# Patient Record
Sex: Female | Born: 1940 | Race: White | Hispanic: No | Marital: Married | Smoking: Never smoker
Health system: Southern US, Community
[De-identification: ages and names within clinical notes are randomized; demographics above are authoritative.]

## PROBLEM LIST (undated history)

## (undated) DIAGNOSIS — M1990 Unspecified osteoarthritis, unspecified site: Secondary | ICD-10-CM

## (undated) DIAGNOSIS — F32A Depression, unspecified: Secondary | ICD-10-CM

## (undated) DIAGNOSIS — K760 Fatty (change of) liver, not elsewhere classified: Secondary | ICD-10-CM

## (undated) DIAGNOSIS — D509 Iron deficiency anemia, unspecified: Secondary | ICD-10-CM

## (undated) DIAGNOSIS — E785 Hyperlipidemia, unspecified: Secondary | ICD-10-CM

## (undated) DIAGNOSIS — F419 Anxiety disorder, unspecified: Secondary | ICD-10-CM

## (undated) DIAGNOSIS — M359 Systemic involvement of connective tissue, unspecified: Secondary | ICD-10-CM

## (undated) DIAGNOSIS — F329 Major depressive disorder, single episode, unspecified: Secondary | ICD-10-CM

## (undated) DIAGNOSIS — I10 Essential (primary) hypertension: Secondary | ICD-10-CM

## (undated) DIAGNOSIS — E119 Type 2 diabetes mellitus without complications: Secondary | ICD-10-CM

## (undated) DIAGNOSIS — C55 Malignant neoplasm of uterus, part unspecified: Secondary | ICD-10-CM

## (undated) DIAGNOSIS — K8020 Calculus of gallbladder without cholecystitis without obstruction: Secondary | ICD-10-CM

## (undated) DIAGNOSIS — D249 Benign neoplasm of unspecified breast: Secondary | ICD-10-CM

## (undated) DIAGNOSIS — G8929 Other chronic pain: Secondary | ICD-10-CM

## (undated) HISTORY — DX: Malignant neoplasm of uterus, part unspecified: C55

## (undated) HISTORY — DX: Anxiety disorder, unspecified: F41.9

## (undated) HISTORY — PX: CHOLECYSTECTOMY: SHX55

## (undated) HISTORY — DX: Iron deficiency anemia, unspecified: D50.9

## (undated) HISTORY — DX: Type 2 diabetes mellitus without complications: E11.9

## (undated) HISTORY — DX: Other chronic pain: G89.29

## (undated) HISTORY — DX: Unspecified osteoarthritis, unspecified site: M19.90

## (undated) HISTORY — DX: Major depressive disorder, single episode, unspecified: F32.9

## (undated) HISTORY — DX: Calculus of gallbladder without cholecystitis without obstruction: K80.20

## (undated) HISTORY — PX: TONSILLECTOMY: SUR1361

## (undated) HISTORY — DX: Benign neoplasm of unspecified breast: D24.9

## (undated) HISTORY — DX: Fatty (change of) liver, not elsewhere classified: K76.0

## (undated) HISTORY — DX: Depression, unspecified: F32.A

## (undated) HISTORY — DX: Essential (primary) hypertension: I10

## (undated) HISTORY — PX: TUBAL LIGATION: SHX77

## (undated) HISTORY — DX: Hyperlipidemia, unspecified: E78.5

---

## 1992-11-10 HISTORY — PX: CARPAL TUNNEL RELEASE: SHX101

## 2001-07-15 ENCOUNTER — Encounter: Payer: Self-pay | Admitting: Internal Medicine

## 2001-07-15 ENCOUNTER — Ambulatory Visit (HOSPITAL_COMMUNITY): Admission: RE | Admit: 2001-07-15 | Discharge: 2001-07-15 | Payer: Self-pay | Admitting: Internal Medicine

## 2001-07-16 ENCOUNTER — Ambulatory Visit (HOSPITAL_COMMUNITY): Admission: RE | Admit: 2001-07-16 | Discharge: 2001-07-16 | Payer: Self-pay | Admitting: Internal Medicine

## 2001-08-03 ENCOUNTER — Encounter (HOSPITAL_COMMUNITY): Admission: RE | Admit: 2001-08-03 | Discharge: 2001-09-02 | Payer: Self-pay | Admitting: Oncology

## 2001-08-03 ENCOUNTER — Encounter: Admission: RE | Admit: 2001-08-03 | Discharge: 2001-08-03 | Payer: Self-pay | Admitting: Oncology

## 2001-09-09 ENCOUNTER — Encounter: Admission: RE | Admit: 2001-09-09 | Discharge: 2001-09-09 | Payer: Self-pay | Admitting: Oncology

## 2001-09-09 ENCOUNTER — Encounter (HOSPITAL_COMMUNITY): Admission: RE | Admit: 2001-09-09 | Discharge: 2001-10-09 | Payer: Self-pay | Admitting: Oncology

## 2001-11-10 HISTORY — PX: COLONOSCOPY: SHX174

## 2001-11-15 ENCOUNTER — Encounter (HOSPITAL_COMMUNITY): Admission: RE | Admit: 2001-11-15 | Discharge: 2001-12-15 | Payer: Self-pay | Admitting: Oncology

## 2001-11-15 ENCOUNTER — Encounter: Admission: RE | Admit: 2001-11-15 | Discharge: 2001-11-15 | Payer: Self-pay | Admitting: Oncology

## 2001-12-03 ENCOUNTER — Ambulatory Visit (HOSPITAL_COMMUNITY): Admission: RE | Admit: 2001-12-03 | Discharge: 2001-12-03 | Payer: Self-pay | Admitting: Internal Medicine

## 2002-04-14 ENCOUNTER — Encounter (HOSPITAL_COMMUNITY): Admission: RE | Admit: 2002-04-14 | Discharge: 2002-05-14 | Payer: Self-pay | Admitting: Oncology

## 2002-04-14 ENCOUNTER — Encounter: Admission: RE | Admit: 2002-04-14 | Discharge: 2002-04-14 | Payer: Self-pay | Admitting: Oncology

## 2002-10-21 ENCOUNTER — Encounter (HOSPITAL_COMMUNITY): Admission: RE | Admit: 2002-10-21 | Discharge: 2002-11-20 | Payer: Self-pay | Admitting: Oncology

## 2002-10-21 ENCOUNTER — Encounter: Admission: RE | Admit: 2002-10-21 | Discharge: 2002-10-21 | Payer: Self-pay | Admitting: Oncology

## 2003-04-10 ENCOUNTER — Ambulatory Visit (HOSPITAL_COMMUNITY): Admission: RE | Admit: 2003-04-10 | Discharge: 2003-04-10 | Payer: Self-pay | Admitting: Internal Medicine

## 2003-04-10 ENCOUNTER — Encounter: Payer: Self-pay | Admitting: Internal Medicine

## 2003-04-17 ENCOUNTER — Ambulatory Visit (HOSPITAL_COMMUNITY): Admission: RE | Admit: 2003-04-17 | Discharge: 2003-04-17 | Payer: Self-pay | Admitting: Internal Medicine

## 2003-04-17 ENCOUNTER — Encounter: Payer: Self-pay | Admitting: Internal Medicine

## 2003-04-21 ENCOUNTER — Encounter: Admission: RE | Admit: 2003-04-21 | Discharge: 2003-04-21 | Payer: Self-pay | Admitting: Oncology

## 2003-04-21 ENCOUNTER — Encounter (HOSPITAL_COMMUNITY): Admission: RE | Admit: 2003-04-21 | Discharge: 2003-05-21 | Payer: Self-pay | Admitting: Oncology

## 2003-07-18 ENCOUNTER — Encounter (HOSPITAL_COMMUNITY): Admission: RE | Admit: 2003-07-18 | Discharge: 2003-08-10 | Payer: Self-pay | Admitting: Oncology

## 2003-07-18 ENCOUNTER — Encounter: Admission: RE | Admit: 2003-07-18 | Discharge: 2003-07-18 | Payer: Self-pay | Admitting: Oncology

## 2003-09-21 ENCOUNTER — Ambulatory Visit (HOSPITAL_BASED_OUTPATIENT_CLINIC_OR_DEPARTMENT_OTHER): Admission: RE | Admit: 2003-09-21 | Discharge: 2003-09-21 | Payer: Self-pay | Admitting: Orthopedic Surgery

## 2003-09-21 ENCOUNTER — Ambulatory Visit (HOSPITAL_COMMUNITY): Admission: RE | Admit: 2003-09-21 | Discharge: 2003-09-21 | Payer: Self-pay | Admitting: Orthopedic Surgery

## 2003-10-12 ENCOUNTER — Ambulatory Visit (HOSPITAL_COMMUNITY): Admission: RE | Admit: 2003-10-12 | Discharge: 2003-10-12 | Payer: Self-pay | Admitting: Internal Medicine

## 2003-10-24 ENCOUNTER — Encounter: Admission: RE | Admit: 2003-10-24 | Discharge: 2003-10-24 | Payer: Self-pay | Admitting: Oncology

## 2003-10-24 ENCOUNTER — Encounter (HOSPITAL_COMMUNITY): Admission: RE | Admit: 2003-10-24 | Discharge: 2003-11-23 | Payer: Self-pay | Admitting: Oncology

## 2003-10-25 ENCOUNTER — Ambulatory Visit (HOSPITAL_COMMUNITY): Admission: RE | Admit: 2003-10-25 | Discharge: 2003-10-25 | Payer: Self-pay | Admitting: Internal Medicine

## 2004-01-19 ENCOUNTER — Encounter (HOSPITAL_COMMUNITY): Admission: RE | Admit: 2004-01-19 | Discharge: 2004-02-18 | Payer: Self-pay | Admitting: Oncology

## 2004-01-19 ENCOUNTER — Encounter: Admission: RE | Admit: 2004-01-19 | Discharge: 2004-01-19 | Payer: Self-pay | Admitting: Oncology

## 2004-03-06 ENCOUNTER — Ambulatory Visit (HOSPITAL_COMMUNITY): Admission: RE | Admit: 2004-03-06 | Discharge: 2004-03-06 | Payer: Self-pay | Admitting: Internal Medicine

## 2004-04-24 ENCOUNTER — Encounter (HOSPITAL_COMMUNITY): Admission: RE | Admit: 2004-04-24 | Discharge: 2004-05-24 | Payer: Self-pay | Admitting: Oncology

## 2004-04-24 ENCOUNTER — Encounter: Admission: RE | Admit: 2004-04-24 | Discharge: 2004-04-24 | Payer: Self-pay | Admitting: Oncology

## 2004-05-29 ENCOUNTER — Ambulatory Visit (HOSPITAL_COMMUNITY): Admission: RE | Admit: 2004-05-29 | Discharge: 2004-05-29 | Payer: Self-pay | Admitting: Internal Medicine

## 2004-06-03 ENCOUNTER — Encounter: Admission: RE | Admit: 2004-06-03 | Discharge: 2004-06-03 | Payer: Self-pay | Admitting: Oncology

## 2004-08-23 ENCOUNTER — Encounter: Admission: RE | Admit: 2004-08-23 | Discharge: 2004-08-23 | Payer: Self-pay | Admitting: Oncology

## 2004-08-23 ENCOUNTER — Encounter (HOSPITAL_COMMUNITY): Admission: RE | Admit: 2004-08-23 | Discharge: 2004-09-22 | Payer: Self-pay | Admitting: Oncology

## 2004-11-10 DIAGNOSIS — K760 Fatty (change of) liver, not elsewhere classified: Secondary | ICD-10-CM

## 2004-11-10 DIAGNOSIS — K8020 Calculus of gallbladder without cholecystitis without obstruction: Secondary | ICD-10-CM

## 2004-11-10 HISTORY — DX: Fatty (change of) liver, not elsewhere classified: K76.0

## 2004-11-10 HISTORY — DX: Calculus of gallbladder without cholecystitis without obstruction: K80.20

## 2004-11-10 HISTORY — PX: CHOLECYSTECTOMY, LAPAROSCOPIC: SHX56

## 2004-12-25 ENCOUNTER — Encounter: Admission: RE | Admit: 2004-12-25 | Discharge: 2004-12-25 | Payer: Self-pay | Admitting: Oncology

## 2004-12-25 ENCOUNTER — Ambulatory Visit (HOSPITAL_COMMUNITY): Payer: Self-pay | Admitting: Oncology

## 2004-12-25 ENCOUNTER — Encounter (HOSPITAL_COMMUNITY): Admission: RE | Admit: 2004-12-25 | Discharge: 2005-01-24 | Payer: Self-pay | Admitting: Oncology

## 2005-02-26 ENCOUNTER — Encounter (HOSPITAL_COMMUNITY): Admission: RE | Admit: 2005-02-26 | Discharge: 2005-03-28 | Payer: Self-pay | Admitting: Oncology

## 2005-02-26 ENCOUNTER — Ambulatory Visit (HOSPITAL_COMMUNITY): Payer: Self-pay | Admitting: Oncology

## 2005-02-26 ENCOUNTER — Encounter: Admission: RE | Admit: 2005-02-26 | Discharge: 2005-02-26 | Payer: Self-pay | Admitting: Oncology

## 2005-03-27 ENCOUNTER — Encounter (HOSPITAL_COMMUNITY): Admission: RE | Admit: 2005-03-27 | Discharge: 2005-04-26 | Payer: Self-pay | Admitting: Rheumatology

## 2005-05-22 ENCOUNTER — Ambulatory Visit (HOSPITAL_COMMUNITY): Admission: RE | Admit: 2005-05-22 | Discharge: 2005-05-22 | Payer: Self-pay | Admitting: Internal Medicine

## 2005-06-04 ENCOUNTER — Encounter (INDEPENDENT_AMBULATORY_CARE_PROVIDER_SITE_OTHER): Payer: Self-pay | Admitting: General Surgery

## 2005-06-05 ENCOUNTER — Inpatient Hospital Stay (HOSPITAL_COMMUNITY): Admission: RE | Admit: 2005-06-05 | Discharge: 2005-06-06 | Payer: Self-pay | Admitting: General Surgery

## 2005-07-15 ENCOUNTER — Ambulatory Visit (HOSPITAL_COMMUNITY): Payer: Self-pay | Admitting: Oncology

## 2005-07-15 ENCOUNTER — Encounter (HOSPITAL_COMMUNITY): Admission: RE | Admit: 2005-07-15 | Discharge: 2005-08-08 | Payer: Self-pay

## 2005-07-15 ENCOUNTER — Encounter: Admission: RE | Admit: 2005-07-15 | Discharge: 2005-08-08 | Payer: Self-pay | Admitting: Oncology

## 2005-07-29 ENCOUNTER — Ambulatory Visit (HOSPITAL_COMMUNITY): Admission: RE | Admit: 2005-07-29 | Discharge: 2005-07-29 | Payer: Self-pay | Admitting: Internal Medicine

## 2006-01-29 ENCOUNTER — Encounter: Admission: RE | Admit: 2006-01-29 | Discharge: 2006-01-29 | Payer: Self-pay | Admitting: Oncology

## 2006-01-29 ENCOUNTER — Ambulatory Visit (HOSPITAL_COMMUNITY): Payer: Self-pay | Admitting: Oncology

## 2006-01-29 ENCOUNTER — Encounter (HOSPITAL_COMMUNITY): Admission: RE | Admit: 2006-01-29 | Discharge: 2006-02-28 | Payer: Self-pay | Admitting: Oncology

## 2006-05-24 ENCOUNTER — Emergency Department (HOSPITAL_COMMUNITY): Admission: EM | Admit: 2006-05-24 | Discharge: 2006-05-24 | Payer: Self-pay | Admitting: Emergency Medicine

## 2006-06-09 ENCOUNTER — Encounter (HOSPITAL_COMMUNITY): Admission: RE | Admit: 2006-06-09 | Discharge: 2006-07-09 | Payer: Self-pay | Admitting: Internal Medicine

## 2006-08-06 ENCOUNTER — Encounter (HOSPITAL_COMMUNITY): Admission: RE | Admit: 2006-08-06 | Discharge: 2006-08-08 | Payer: Self-pay | Admitting: Oncology

## 2006-08-06 ENCOUNTER — Ambulatory Visit (HOSPITAL_COMMUNITY): Payer: Self-pay | Admitting: Oncology

## 2006-08-06 ENCOUNTER — Encounter: Admission: RE | Admit: 2006-08-06 | Discharge: 2006-08-08 | Payer: Self-pay | Admitting: Oncology

## 2006-08-11 ENCOUNTER — Ambulatory Visit (HOSPITAL_COMMUNITY): Admission: RE | Admit: 2006-08-11 | Discharge: 2006-08-11 | Payer: Self-pay | Admitting: Internal Medicine

## 2007-02-03 ENCOUNTER — Encounter (HOSPITAL_COMMUNITY): Admission: RE | Admit: 2007-02-03 | Discharge: 2007-03-05 | Payer: Self-pay | Admitting: Oncology

## 2007-02-03 ENCOUNTER — Ambulatory Visit (HOSPITAL_COMMUNITY): Payer: Self-pay | Admitting: Oncology

## 2007-07-27 ENCOUNTER — Ambulatory Visit (HOSPITAL_COMMUNITY): Payer: Self-pay | Admitting: Oncology

## 2007-07-27 ENCOUNTER — Encounter (HOSPITAL_COMMUNITY): Admission: RE | Admit: 2007-07-27 | Discharge: 2007-08-10 | Payer: Self-pay | Admitting: Oncology

## 2007-09-03 ENCOUNTER — Ambulatory Visit (HOSPITAL_COMMUNITY): Admission: RE | Admit: 2007-09-03 | Discharge: 2007-09-03 | Payer: Self-pay | Admitting: Internal Medicine

## 2008-01-31 ENCOUNTER — Ambulatory Visit (HOSPITAL_COMMUNITY): Payer: Self-pay | Admitting: Oncology

## 2008-01-31 ENCOUNTER — Encounter (HOSPITAL_COMMUNITY): Admission: RE | Admit: 2008-01-31 | Discharge: 2008-03-01 | Payer: Self-pay | Admitting: Oncology

## 2008-03-13 ENCOUNTER — Ambulatory Visit (HOSPITAL_COMMUNITY): Admission: RE | Admit: 2008-03-13 | Discharge: 2008-03-13 | Payer: Self-pay | Admitting: Oncology

## 2008-03-17 ENCOUNTER — Ambulatory Visit (HOSPITAL_COMMUNITY): Payer: Self-pay | Admitting: Oncology

## 2008-05-04 ENCOUNTER — Encounter (HOSPITAL_COMMUNITY): Admission: RE | Admit: 2008-05-04 | Discharge: 2008-06-03 | Payer: Self-pay | Admitting: Oncology

## 2008-05-04 ENCOUNTER — Ambulatory Visit (HOSPITAL_COMMUNITY): Payer: Self-pay | Admitting: Oncology

## 2008-09-05 ENCOUNTER — Ambulatory Visit (HOSPITAL_COMMUNITY): Admission: RE | Admit: 2008-09-05 | Discharge: 2008-09-05 | Payer: Self-pay | Admitting: Internal Medicine

## 2008-09-11 ENCOUNTER — Ambulatory Visit (HOSPITAL_COMMUNITY): Admission: RE | Admit: 2008-09-11 | Discharge: 2008-09-11 | Payer: Self-pay | Admitting: Internal Medicine

## 2008-09-28 ENCOUNTER — Encounter (HOSPITAL_COMMUNITY): Admission: RE | Admit: 2008-09-28 | Discharge: 2008-10-28 | Payer: Self-pay | Admitting: Oncology

## 2008-09-28 ENCOUNTER — Ambulatory Visit (HOSPITAL_COMMUNITY): Payer: Self-pay | Admitting: Oncology

## 2009-01-30 ENCOUNTER — Encounter (HOSPITAL_COMMUNITY): Admission: RE | Admit: 2009-01-30 | Discharge: 2009-03-01 | Payer: Self-pay | Admitting: Oncology

## 2009-01-30 ENCOUNTER — Ambulatory Visit (HOSPITAL_COMMUNITY): Payer: Self-pay | Admitting: Oncology

## 2009-08-22 ENCOUNTER — Encounter (HOSPITAL_COMMUNITY): Admission: RE | Admit: 2009-08-22 | Discharge: 2009-09-21 | Payer: Self-pay | Admitting: Oncology

## 2009-08-22 ENCOUNTER — Ambulatory Visit (HOSPITAL_COMMUNITY): Payer: Self-pay | Admitting: Oncology

## 2009-09-07 ENCOUNTER — Ambulatory Visit (HOSPITAL_COMMUNITY): Admission: RE | Admit: 2009-09-07 | Discharge: 2009-09-07 | Payer: Self-pay | Admitting: Internal Medicine

## 2009-09-13 ENCOUNTER — Ambulatory Visit (HOSPITAL_COMMUNITY): Admission: RE | Admit: 2009-09-13 | Discharge: 2009-09-13 | Payer: Self-pay | Admitting: Family Medicine

## 2010-02-25 ENCOUNTER — Ambulatory Visit (HOSPITAL_COMMUNITY): Payer: Self-pay | Admitting: Oncology

## 2010-02-25 ENCOUNTER — Encounter (HOSPITAL_COMMUNITY): Admission: RE | Admit: 2010-02-25 | Discharge: 2010-03-27 | Payer: Self-pay | Admitting: Oncology

## 2010-06-21 ENCOUNTER — Ambulatory Visit (HOSPITAL_COMMUNITY): Payer: Self-pay | Admitting: Oncology

## 2010-06-21 ENCOUNTER — Encounter (HOSPITAL_COMMUNITY): Admission: RE | Admit: 2010-06-21 | Discharge: 2010-07-21 | Payer: Self-pay | Admitting: Oncology

## 2010-09-09 ENCOUNTER — Ambulatory Visit (HOSPITAL_COMMUNITY): Admission: RE | Admit: 2010-09-09 | Discharge: 2010-09-09 | Payer: Self-pay | Admitting: Internal Medicine

## 2010-09-18 ENCOUNTER — Encounter (HOSPITAL_COMMUNITY)
Admission: RE | Admit: 2010-09-18 | Discharge: 2010-10-18 | Payer: Self-pay | Source: Home / Self Care | Attending: Oncology | Admitting: Oncology

## 2010-09-18 ENCOUNTER — Ambulatory Visit (HOSPITAL_COMMUNITY): Payer: Self-pay | Admitting: Oncology

## 2010-12-01 ENCOUNTER — Encounter: Payer: Self-pay | Admitting: Internal Medicine

## 2011-01-15 ENCOUNTER — Encounter (HOSPITAL_COMMUNITY): Payer: No Typology Code available for payment source | Attending: Oncology

## 2011-01-15 ENCOUNTER — Other Ambulatory Visit (HOSPITAL_COMMUNITY): Payer: No Typology Code available for payment source

## 2011-01-15 DIAGNOSIS — D509 Iron deficiency anemia, unspecified: Secondary | ICD-10-CM | POA: Insufficient documentation

## 2011-01-15 DIAGNOSIS — D638 Anemia in other chronic diseases classified elsewhere: Secondary | ICD-10-CM | POA: Insufficient documentation

## 2011-01-15 DIAGNOSIS — E119 Type 2 diabetes mellitus without complications: Secondary | ICD-10-CM | POA: Insufficient documentation

## 2011-01-15 DIAGNOSIS — D649 Anemia, unspecified: Secondary | ICD-10-CM

## 2011-01-17 ENCOUNTER — Ambulatory Visit (HOSPITAL_COMMUNITY): Payer: No Typology Code available for payment source | Admitting: Oncology

## 2011-01-17 DIAGNOSIS — D509 Iron deficiency anemia, unspecified: Secondary | ICD-10-CM

## 2011-01-21 LAB — CBC
HCT: 36 % (ref 36.0–46.0)
Hemoglobin: 12 g/dL (ref 12.0–15.0)
MCH: 28.5 pg (ref 26.0–34.0)
MCHC: 33.3 g/dL (ref 30.0–36.0)
MCV: 85.8 fL (ref 78.0–100.0)
Platelets: 235 10*3/uL (ref 150–400)
RBC: 4.2 MIL/uL (ref 3.87–5.11)
RDW: 14.8 % (ref 11.5–15.5)
WBC: 6.3 10*3/uL (ref 4.0–10.5)

## 2011-01-21 LAB — DIFFERENTIAL
Basophils Absolute: 0 10*3/uL (ref 0.0–0.1)
Basophils Relative: 1 % (ref 0–1)
Eosinophils Absolute: 0.1 10*3/uL (ref 0.0–0.7)
Eosinophils Relative: 1 % (ref 0–5)
Lymphocytes Relative: 37 % (ref 12–46)
Lymphs Abs: 2.3 10*3/uL (ref 0.7–4.0)
Monocytes Absolute: 0.4 10*3/uL (ref 0.1–1.0)
Monocytes Relative: 6 % (ref 3–12)
Neutro Abs: 3.4 10*3/uL (ref 1.7–7.7)
Neutrophils Relative %: 55 % (ref 43–77)

## 2011-01-21 LAB — FERRITIN: Ferritin: 73 ng/mL (ref 10–291)

## 2011-01-21 LAB — IRON AND TIBC
Iron: 86 ug/dL (ref 42–135)
Saturation Ratios: 21 % (ref 20–55)
TIBC: 401 ug/dL (ref 250–470)
UIBC: 315 ug/dL

## 2011-01-24 LAB — CBC
HCT: 35.3 % — ABNORMAL LOW (ref 36.0–46.0)
Hemoglobin: 11.8 g/dL — ABNORMAL LOW (ref 12.0–15.0)
MCH: 29 pg (ref 26.0–34.0)
MCHC: 33.5 g/dL (ref 30.0–36.0)
MCV: 86.7 fL (ref 78.0–100.0)
Platelets: 223 10*3/uL (ref 150–400)
RBC: 4.08 MIL/uL (ref 3.87–5.11)
RDW: 14 % (ref 11.5–15.5)
WBC: 6.3 10*3/uL (ref 4.0–10.5)

## 2011-01-24 LAB — IRON AND TIBC
Iron: 85 ug/dL (ref 42–135)
Saturation Ratios: 21 % (ref 20–55)
UIBC: 312 ug/dL

## 2011-01-24 LAB — DIFFERENTIAL
Basophils Absolute: 0 10*3/uL (ref 0.0–0.1)
Lymphocytes Relative: 37 % (ref 12–46)
Lymphs Abs: 2.3 10*3/uL (ref 0.7–4.0)
Monocytes Absolute: 0.4 10*3/uL (ref 0.1–1.0)
Neutro Abs: 3.5 10*3/uL (ref 1.7–7.7)

## 2011-01-24 LAB — FERRITIN: Ferritin: 71 ng/mL (ref 10–291)

## 2011-01-28 LAB — CBC
MCHC: 34.5 g/dL (ref 30.0–36.0)
RDW: 14 % (ref 11.5–15.5)

## 2011-01-28 LAB — DIFFERENTIAL
Basophils Absolute: 0 10*3/uL (ref 0.0–0.1)
Basophils Relative: 1 % (ref 0–1)
Neutro Abs: 2.3 10*3/uL (ref 1.7–7.7)
Neutrophils Relative %: 48 % (ref 43–77)

## 2011-01-28 LAB — FERRITIN: Ferritin: 81 ng/mL (ref 10–291)

## 2011-02-13 LAB — CBC
RBC: 4.31 MIL/uL (ref 3.87–5.11)
WBC: 7.3 10*3/uL (ref 4.0–10.5)

## 2011-02-13 LAB — FERRITIN: Ferritin: 119 ng/mL (ref 10–291)

## 2011-02-20 LAB — CBC
HCT: 37.7 % (ref 36.0–46.0)
Hemoglobin: 12.9 g/dL (ref 12.0–15.0)
Platelets: 207 10*3/uL (ref 150–400)
RBC: 4.25 MIL/uL (ref 3.87–5.11)
WBC: 6.2 10*3/uL (ref 4.0–10.5)

## 2011-03-28 NOTE — Discharge Summary (Signed)
Kristina Rodriguez, LELAND NO.:  [id number]   MEDICAL RECORD NO.:  [id number]          PATIENT TYPE:  INP   LOCATION:  A326                          FACILITY:  [facility]   PHYSICIAN:  Barbaraann Barthel, M.D. DATE OF BIRTH:  07-20-1941   DATE OF ADMISSION:  06/04/2005  DATE OF DISCHARGE:  07/28/2006LH                                 DISCHARGE SUMMARY   Surgery on June 04, 2005 laparoscopic cholecystectomy for cholecystitis  secondary to cholelithiasis.   SECONDARY DIAGNOSIS:  1.  Diabetes mellitus.  2.  Hypertension.  3.  Anxiety.   NOTE:  This is 70 year old white female who was admitted via the outpatient  department for laparoscopic cholecystectomy. She had essentially 20-year  history of recurrent right upper quadrant pain, sometimes worse than others,  accompanied at times with nausea and vomiting. She was found to have on  sonography cholelithiasis. Her liver function studies were grossly within  normal limits. Her bilirubin was normal, and her amylase was not elevated.  She was taken to surgery where a laparoscopic procedure was possible despite  the inflammation. She still had some edema and signs of acute and chronic  cholecystitis. We were able to proceed laparoscopically. This was done  uneventfully. We left a drain in place, and on the first postoperative day,  she was having some discomfort which required parenteral pain medicines.  There was a little drop in her H&H was subsequently found to be dilutional  from postoperative. Her blood sugars were well controlled postoperatively,  and she was discharged on the second postoperative day without problems.   CONSULTATIONS:  Dr. [name].   As stated her hospital course was uneventful. Her diet and activity was  advanced as tolerated. Her drain was removed on the second postoperative  day. There was minimal serosanguineous drainage, less than 20 cc per 24-hour  period. At the time of discharge, she had minimal  incisional discomfort. She  was tolerating p.o. well, moving her bowels and urinating without dysuria.  Had no shortness of breath or leg pain, and as stated, her blood sugars were  controlled.   LABORATORY DATA:  Her pathology report to chronic inflammation with some  serous fibrosis cholelithiasis. Her laboratory data otherwise showed her to  have a white count on June 04, 2005 of 7.6 and 13.4 and 38.0 with a normal  differential. Her electrolytes were grossly within normal limits. Her blood  sugar was in the 200 range on July 24,2006. At the time of discharge in the  morning, her blood sugar was 109. Her liver function studies remained within  normal limits, and her H&H at the time of discharge was 11.0 and 30.8.   DISCHARGE INSTRUCTIONS:  She is excused from any work, any heavy lifting.  She is told to advance her activity as tolerated. She is discharged on a  full liquid and soft diet. She is told to no heavy lifting or driving or  swim in any swimming pools or anything in the postoperative period. She is  told clear wound with alcohol three times a day, take Darvocet-N 100 one  tablet every 4 hours as needed, resume her preoperative medications as per  Dr. [name].  She is restricted from any aspirin products at present. We have made follow-  up arrangements for her to be seen on August 3 at 10 o'clock in the morning.  She will be returned to Dr. [name] for any medical problems she may have in  the future.      Barbaraann Barthel, M.D.  Electronically Signed     WB/MEDQ  D:  06/06/2005  T:  06/06/2005  Job:  [id number]   cc:   Kingsley Callander. [name], MD  [address]  [place]  [place] [address]  Fax: [phone]

## 2011-03-28 NOTE — Op Note (Signed)
NAMEJATASIA, Kristina Rodriguez                ACCOUNT NO.:  [id number]   MEDICAL RECORD NO.:  [id number]          PATIENT TYPE:  AMB   LOCATION:  DAY                           FACILITY:  [facility]   PHYSICIAN:  Barbaraann Barthel, M.D. DATE OF BIRTH:  [birth year]   DATE OF PROCEDURE:  06/04/2005  DATE OF DISCHARGE:                                 OPERATIVE REPORT   SURGEON:  Dr. Malvin Johns.   PREOPERATIVE DIAGNOSIS:  Cholecystitis, cholelithiasis   PROCEDURE:  Laparoscopic cholecystectomy.   SPECIMEN:  Gallbladder with stones.   NOTE:  This is a 70 year old white female who had had approximately a 20-  year history of right upper quadrant pain, nausea and vomiting. This had  been on and off and intermittent and some times worse than others. She had  been treated for GERD type symptoms. She was worked up and found to have  cholelithiasis on sonogram. Her liver function studies preoperatively were  grossly within normal limits. Her amylase was not elevated.   We discussed laparoscopic cholecystectomy with this patient in detail,  discussing complications not limited to but including bleeding, infection,  damage to bile duct, perforation of organs and transitory diarrhea. Informed  consent was obtained.   GROSS OPERATIVE FINDINGS:  The patient had an edematous gallbladder with  large stones within it consistent with a acute and chronically inflamed  gallbladder. The liver and the rest of the right upper quadrant appeared  grossly within normal limits.   TECHNIQUE:  The patient was placed in a supine position after the adequate  administration of general anesthesia via endotracheal intubation. Her entire  was prepped Betadine solution and draped in the usual manner. Prior to this,  a Foley catheter had been aseptically placed. With the patient  Trendelenburg, a periumbilical incision was carried out over the superior  aspect of the umbilicus. The fascia was grasped with a sharp towel clip and  elevated, and a Veress needle was inserted and confirmed the position with a  saline drop test. The abdomen was then insufflated with approximately 3.5  liters of CO2. Then using the Visiport technique, an 11-mm cannula was  placed in the umbilicus incision, and then under direct vision, three other  cannulas were placed, an 11-mm cannula in the epigastrium and two 5-mm  cannulas in the right upper quadrant laterally. The gallbladder was grasped,  its adhesions were taken down, the cystic duct was clearly visualized,  triply silver clipped on the side of the common bile duct and singly silver  clipped on the side of the gallbladder and divided as was the cystic artery.  The gallbladder was then removed uneventfully using the hook cautery device  from the liver bed. The gallbladder was then placed in an EndoCatch device  and removed through the epigastric incision. We then checked for hemostasis.  There was some oozing from the liver bed which was controlled with cautery  device. I elected to leave two-pieces of Surgicel within the liver bed and  drain the gallbladder as well with the drain exiting through one of the  lateral 5-mm cannula sites. After  irrigating and checking for hemostasis,  the abdomen was then desufflated. The two 11-mm cannula sites in the  epigastrium and then the umbilicus were closed with 0 Polysorb sutures, and  all skin incisions were closed with a stapling device. I used 1/2%  Sensorcaine to help with postoperative comfort. The drain was sutured in  place with 3-0 nylon, and a sterile dressing was applied. Prior to closure,  all sponge, needle, and instrument counts were found to be correct.  Estimated blood loss was minimal. The patient received 1,200 cc crystalloids  intraoperatively. There were no complications.      Barbaraann Barthel, M.D.  Electronically Signed     WB/MEDQ  D:  06/04/2005  T:  06/04/2005  Job:  [id number]   cc:   Kingsley Callander. [name], MD  [address]  [place]  [place] [address]  Fax: [phone]

## 2011-03-28 NOTE — Op Note (Signed)
[facility]  Patient:    Kristina Rodriguez, OHMS Visit Number: [id number] MRN: [id number]          Service Type: END Location: DAY Attending Physician:  Malissa Hippo Dictated by:   Lionel December, M.D. Proc. Date: 12/03/01 Admit Date:  12/03/2001               Ladona Horns. Mariel Sleet, M.D.  Carylon Perches, M.D.   Operative Report  PROCEDURE:  Total colonoscopy followed by esophagogastroduodenoscopy.  GASTROENTEROLOGIST:  Lionel December, M.D.  INDICATION:  Mulki is a 70 year old Caucasian female who was found to have iron-deficiency anemia by Dr. Mariel Sleet last fall.  Her hemoglobin and hematocrit have corrected with iron supplementation.  There is no evidence of obvious or occult GI bleed.  Her last colonoscopy was in 1998.  She also has chronic GERD, and her last EGD was two years ago.  She is on Bextra, and she still has some breakthrough symptoms while on antireflux measures and PPI. She is undergoing total colonoscopy, if normal to be followed by esophagogastroduodenoscopy.  She does not give history of diarrhea or family history of celiac disease.  Both procedure were reviewed with the patient, and informed consent was obtained.  PREOPERATIVE MEDICATIONS:  Cetacaine spray for pharyngeal topical anesthesia. Demerol 75 mg IV, Versed 7 mg IV.  INSTRUMENT:  Olympus video system.  FINDINGS:  The procedures were performed in the endoscopy suite.  The patients vital signs and O2 saturation were monitored during the procedure and remained stable.  #1 -  TOTAL COLONOSCOPY:  Rectal examination performed.  This was within normal limits.  Scope was placed in rectum and advanced under vision to sigmoid colon and beyond. Preparation was satisfactory. The scope was passed into the cecum which was identified by ileocecal valve and appendiceal orifice.  As the scope was withdrawn, the mucosa was once again carefully examined.  There was no polyps, masses, or  angiodysplasia.  Also did not see any diverticula.  Rectal mucosa was normal.  The scope was retroflexed and examined anorectal junction which was normal. Endoscope was straightened and withdrawn.  The patient was prepared for procedure #2.  PROCEDURE #2 - ESOPHAGOGASTRODUODENOSCOPY  Endoscope was passed through oropharynx without difficulty into the esophagus.  Esophageal mucosa: The esophagus was normal.  The squamocolumnar junction was wavy, and there was a small sliding hiatal hernia.  There was incomplete ______ ring proximal to the esophagogastric junction.  This was not dilated as the patient does not have any history of dysphagia.  Stomach:  It was empty and distended very well with insufflation.   Folds in the proximal stomach were normal.  Examination of mucosa revealed patchy erythema and edema at antrum with petechiae in the prepyloric area.  The pyloric channel was patent.  Angularis and fundus were examined by retroflexing the scope and were normal.  Duodenum: Examination of the bulb and second portion of duodenum was normal. Folds in the second part were also normal. Endoscope was withdrawn.  The patient tolerated the procedure well.  FINAL DIAGNOSES: 1. Normal total colonoscopy. 2. Small sliding hiatal hernia along with incomplete ______ ring at    distal esophagus. 3. Antral gastritis. 4. No endoscopic changes in the duodenum to suggest iliac disease.  I suspect she may have iron malabsorption secondary to chronic acid suppression.  She does not have any symptoms of small bowel, and I do not feel she needs further studies unless there is evidence of gastrointestinal bleed and/or her hemoglobin  and hematocrit keep dropping.  RECOMMENDATIONS:  She will continue antireflux measures with Nexium and resume ______ preparation.   She will have her lab studies repeated by Dr. Mariel Sleet in a couple of months.  We will check her H. pylori serologies today. Dictated by:    Lionel December, M.D. Attending Physician:  Malissa Hippo DD:  12/03/01 TD:  12/04/01 Job: [id number]

## 2011-06-30 ENCOUNTER — Encounter (HOSPITAL_COMMUNITY): Payer: No Typology Code available for payment source | Attending: Oncology

## 2011-06-30 DIAGNOSIS — D509 Iron deficiency anemia, unspecified: Secondary | ICD-10-CM | POA: Insufficient documentation

## 2011-06-30 DIAGNOSIS — K909 Intestinal malabsorption, unspecified: Secondary | ICD-10-CM

## 2011-06-30 LAB — IRON AND TIBC
Iron: 78 ug/dL (ref 42–135)
UIBC: 374 ug/dL

## 2011-06-30 LAB — CBC
Hemoglobin: 12.4 g/dL (ref 12.0–15.0)
MCH: 28.1 pg (ref 26.0–34.0)
MCV: 86.4 fL (ref 78.0–100.0)
RBC: 4.41 MIL/uL (ref 3.87–5.11)

## 2011-06-30 LAB — FERRITIN: Ferritin: 52 ng/mL (ref 10–291)

## 2011-06-30 NOTE — Progress Notes (Signed)
Labs drawn today for cbc,ferr,Iron IBC 

## 2011-07-02 ENCOUNTER — Encounter (HOSPITAL_BASED_OUTPATIENT_CLINIC_OR_DEPARTMENT_OTHER): Payer: No Typology Code available for payment source | Admitting: Oncology

## 2011-07-02 ENCOUNTER — Encounter (HOSPITAL_COMMUNITY): Payer: Self-pay | Admitting: Oncology

## 2011-07-02 VITALS — BP 127/80 | HR 97 | Temp 98.6°F | Ht 65.0 in | Wt 165.6 lb

## 2011-07-02 DIAGNOSIS — K909 Intestinal malabsorption, unspecified: Secondary | ICD-10-CM

## 2011-07-02 DIAGNOSIS — D509 Iron deficiency anemia, unspecified: Secondary | ICD-10-CM

## 2011-07-02 NOTE — Progress Notes (Signed)
CC:   Kristina Rodriguez. [name], MD Lionel December, M.D.  DIAGNOSIS: 1. 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  Her last IV iron was given in the form of sodium ferric     gluconate on 03/31/2008.  She had an excellent response to therapy. 2. Diabetes mellitus type 2. 3. Depression. 4. Degenerative joint disease. 5. Negative gastrointestinal workup for blood losses including     colonoscopy in 2008 by Dr. Karilyn Cota. 6. Right carpal tunnel release years ago. 7. Hormone replacement therapy for 5 years, stopping in 2002. 8. Obesity. 9. CODEINE intolerance. 10.Possible element of anemia of chronic disease.  Aren's labs still show that her ferritin is adequate at 52 but it is down 21 points over 9 months.  It was 73 in November 2011.  Her hemoglobin remains normal at 12.4 g, white count and platelets are also fine.  She does not feel great.  She states does have much energy, still remains mildly depressed looking.  Those issues are typically being addressed by Dr. [name] so I want to her back in about 4-5 months, we will schedule her for January for CBC and ferritin. I suspect if she is in the 30s, we need to go ahead just plan on giving her sodium ferric gluconate or Feraheme as a one-time dose which she might like a lot more than the numerous infusions.  But I think we ought to prevent her from getting back to below normal levels.  She is fine with this plan.  We will see her back then.    ______________________________ Ladona Horns. Mariel Sleet, MD ESN/MEDQ  D:  07/02/2011  T:  07/02/2011  Job:  [id number]

## 2011-07-02 NOTE — Patient Instructions (Signed)
[facility]  Discharge Instructions  RECOMMENDATIONS MADE BY THE CONSULTANT AND ANY TEST RESULTS WILL BE SENT TO YOUR REFERRING DOCTOR.   EXAM FINDINGS BY MD TODAY AND SIGNS AND SYMPTOMS TO REPORT TO CLINIC OR PRIMARY MD: Doing well, report increased shortness of breath, increased ice intake or other symptoms of low iron level.   SPECIAL INSTRUCTIONS/FOLLOW-UP: Lab work Needed 12/02/11 at 8:40am and Return to Clinic on 12/03/11 at 9:30am to see MD   I acknowledge that I have been informed and understand all the instructions given to me and received a copy. I do not have any more questions at this time, but understand that I may call the Specialty Clinic at [facility] at [phone] during business hours should I have any further questions or need assistance in obtaining follow-up care.    __________________________________________  _____________  __________ Signature of Patient or Authorized Representative            Date                   Time    __________________________________________ Nurse's Signature

## 2011-07-02 NOTE — Progress Notes (Signed)
This office note has been dictated.

## 2011-08-04 LAB — CBC
Hemoglobin: 12.4
Platelets: 274
RDW: 17.1 — ABNORMAL HIGH

## 2011-08-07 LAB — FERRITIN: Ferritin: 301 — ABNORMAL HIGH (ref 10–291)

## 2011-08-07 LAB — CBC
MCV: 84.6
RBC: 4.36
WBC: 7

## 2011-08-12 LAB — FERRITIN: Ferritin: 180 (ref 10–291)

## 2011-08-12 LAB — CBC
Platelets: 226
RDW: 13.8

## 2011-08-21 LAB — CBC
Platelets: 232
RBC: 4.2
WBC: 6.1

## 2011-08-21 LAB — FERRITIN: Ferritin: 27 (ref 10–291)

## 2011-09-23 ENCOUNTER — Ambulatory Visit (HOSPITAL_COMMUNITY): Payer: No Typology Code available for payment source

## 2011-09-23 ENCOUNTER — Other Ambulatory Visit (HOSPITAL_COMMUNITY): Payer: Self-pay | Admitting: Internal Medicine

## 2011-09-23 DIAGNOSIS — Z139 Encounter for screening, unspecified: Secondary | ICD-10-CM

## 2011-09-29 ENCOUNTER — Ambulatory Visit (HOSPITAL_COMMUNITY)
Admission: RE | Admit: 2011-09-29 | Discharge: 2011-09-29 | Disposition: A | Discharge status: Home / Self Care | Payer: No Typology Code available for payment source | Source: Ambulatory Visit | Attending: Internal Medicine | Admitting: Internal Medicine

## 2011-09-29 DIAGNOSIS — Z1231 Encounter for screening mammogram for malignant neoplasm of breast: Secondary | ICD-10-CM | POA: Insufficient documentation

## 2011-09-29 DIAGNOSIS — Z139 Encounter for screening, unspecified: Secondary | ICD-10-CM

## 2011-10-08 ENCOUNTER — Other Ambulatory Visit: Payer: Self-pay | Admitting: Internal Medicine

## 2011-10-08 DIAGNOSIS — R928 Other abnormal and inconclusive findings on diagnostic imaging of breast: Secondary | ICD-10-CM

## 2011-10-22 ENCOUNTER — Ambulatory Visit (HOSPITAL_COMMUNITY)
Admission: RE | Admit: 2011-10-22 | Discharge: 2011-10-22 | Disposition: A | Discharge status: Home / Self Care | Payer: No Typology Code available for payment source | Source: Ambulatory Visit | Attending: Internal Medicine | Admitting: Internal Medicine

## 2011-10-22 ENCOUNTER — Other Ambulatory Visit (HOSPITAL_COMMUNITY): Payer: Self-pay | Admitting: Internal Medicine

## 2011-10-22 DIAGNOSIS — R928 Other abnormal and inconclusive findings on diagnostic imaging of breast: Secondary | ICD-10-CM

## 2011-10-29 ENCOUNTER — Ambulatory Visit (HOSPITAL_COMMUNITY): Payer: No Typology Code available for payment source

## 2011-12-02 ENCOUNTER — Encounter (HOSPITAL_COMMUNITY): Payer: Medicare FFS | Attending: Oncology

## 2011-12-02 DIAGNOSIS — D509 Iron deficiency anemia, unspecified: Secondary | ICD-10-CM | POA: Insufficient documentation

## 2011-12-02 LAB — CBC
HCT: 38.3 % (ref 36.0–46.0)
Hemoglobin: 12.3 g/dL (ref 12.0–15.0)
WBC: 11.8 10*3/uL — ABNORMAL HIGH (ref 4.0–10.5)

## 2011-12-02 LAB — FERRITIN: Ferritin: 27 ng/mL (ref 10–291)

## 2011-12-02 NOTE — Progress Notes (Signed)
Labs drawn today for cbc,ferr 

## 2011-12-03 ENCOUNTER — Encounter (HOSPITAL_COMMUNITY): Payer: Self-pay | Admitting: Oncology

## 2011-12-03 ENCOUNTER — Encounter (HOSPITAL_BASED_OUTPATIENT_CLINIC_OR_DEPARTMENT_OTHER): Payer: Medicare FFS | Admitting: Oncology

## 2011-12-03 VITALS — BP 112/60 | HR 92 | Temp 97.4°F | Ht 64.5 in | Wt 162.2 lb

## 2011-12-03 DIAGNOSIS — D509 Iron deficiency anemia, unspecified: Secondary | ICD-10-CM

## 2011-12-03 MED ORDER — SODIUM CHLORIDE 0.9 % IV SOLN: 1020.0000 mg | Freq: Once | INTRAVENOUS | Status: DC

## 2011-12-03 NOTE — Patient Instructions (Signed)
Kristina Rodriguez  [id number] Sep 21, 1941   [facility]  Discharge Instructions  RECOMMENDATIONS MADE BY THE CONSULTANT AND ANY TEST RESULTS WILL BE SENT TO YOUR REFERRING DOCTOR.   EXAM FINDINGS BY MD TODAY AND SIGNS AND SYMPTOMS TO REPORT TO CLINIC OR PRIMARY MD: Your hemoglobin is good but your ferritin level is low.  Plan to give you Feraheme on Monday, recheck your blood counts 1 month and 4 months later.  MEDICATIONS PRESCRIBED: none   INSTRUCTIONS GIVEN AND DISCUSSED: Other :  Report increased fatigue, increased ice intake or shortness of breath.  SPECIAL INSTRUCTIONS/FOLLOW-UP: Lab work Needed in 1 and 4 months and Return to Clinic on Monday for feraheme and to see MD after labs done in June.   I acknowledge that I have been informed and understand all the instructions given to me and received a copy. I do not have any more questions at this time, but understand that I may call the Specialty Clinic at [facility] at [phone] during business hours should I have any further questions or need assistance in obtaining follow-up care.    __________________________________________  _____________  __________ Signature of Patient or Authorized Representative            Date                   Time    __________________________________________ Nurse's Signature

## 2011-12-03 NOTE — Progress Notes (Signed)
This office note has been dictated.

## 2011-12-03 NOTE — Progress Notes (Signed)
CC:   Kristina Rodriguez. [name], MD Lionel December, M.D.  DIAGNOSES: 1. 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  Her last IV iron was in the form of sodium ferric     gluconate on 03/31/2008.  She had an excellent response to therapy     with normalization of her hemoglobin and she is being followed, but     her ferritin now is falling to just below the lower limits of     normal at 27.  Hemoglobin remains normal, and I think it is time to     go ahead and transfuse her with IV iron again.  Will use Feraheme     this time. 2. Diabetes mellitus type 2. 3. Depression. 4. Degenerate joint disease. 5. Negative gastrointestinal workup for blood losses including     colonoscopy in 2008 by Dr. Karilyn Cota. 6. Right carpal tunnel release years ago. 7. Hormone replacement therapy for 5 years, stopped in 2002. 8. Obesity. 9. CODEINE INTOLERANCE. 10.Possible element of anemia of chronic disease in the past.  Kristina Rodriguez's labs do show a normal hemoglobin, normal platelet count, a minimally elevated white count  on the 22nd without any suspicious reason for that, but ferritin has dropped from 52 to 27, so she has gone from her initially great response level in 2009 of 301 gradually dropping down to 52 in August, now 27.  So we will set her up for IV iron next Monday and then a month later get a ferritin and then in June get a CBC and ferritin.  Will see what the rate of fall is of the ferritin and she is agreeable to this plan.    ______________________________ Ladona Horns. Mariel Sleet, MD ESN/MEDQ  D:  12/03/2011  T:  12/03/2011  Job:  [id number]

## 2011-12-08 ENCOUNTER — Encounter (HOSPITAL_BASED_OUTPATIENT_CLINIC_OR_DEPARTMENT_OTHER): Payer: Medicare FFS

## 2011-12-08 DIAGNOSIS — D509 Iron deficiency anemia, unspecified: Secondary | ICD-10-CM

## 2011-12-08 MED ORDER — SODIUM CHLORIDE 0.9 % IV SOLN
Freq: Once | INTRAVENOUS | Status: AC
  Administered 2011-12-08: 11:00:00 via INTRAVENOUS

## 2011-12-08 MED ORDER — SODIUM CHLORIDE 0.9 % IJ SOLN
INTRAMUSCULAR | Status: AC
  Administered 2011-12-08: 10 mL via INTRAVENOUS
  Filled 2011-12-08: qty 10

## 2011-12-08 MED ORDER — SODIUM CHLORIDE 0.9 % IV SOLN
1020.0000 mg | Freq: Once | INTRAVENOUS | Status: AC
  Administered 2011-12-08: 1020 mg via INTRAVENOUS
  Filled 2011-12-08: qty 34

## 2011-12-08 MED ORDER — SODIUM CHLORIDE 0.9 % IJ SOLN
10.0000 mL | Freq: Once | INTRAMUSCULAR | Status: AC
  Administered 2011-12-08: 10 mL via INTRAVENOUS
  Filled 2011-12-08: qty 10

## 2011-12-08 NOTE — Progress Notes (Signed)
Tolerated feraheme without problems 

## 2012-01-05 ENCOUNTER — Encounter (HOSPITAL_COMMUNITY): Payer: Medicare HMO | Attending: Oncology

## 2012-01-05 DIAGNOSIS — D509 Iron deficiency anemia, unspecified: Secondary | ICD-10-CM | POA: Insufficient documentation

## 2012-01-05 LAB — FERRITIN: Ferritin: 400 ng/mL — ABNORMAL HIGH (ref 10–291)

## 2012-01-05 LAB — CBC
HCT: 37.5 % (ref 36.0–46.0)
Hemoglobin: 12.1 g/dL (ref 12.0–15.0)
RDW: 15.8 % — ABNORMAL HIGH (ref 11.5–15.5)
WBC: 6.2 10*3/uL (ref 4.0–10.5)

## 2012-01-05 NOTE — Progress Notes (Signed)
Labs drawn today for cbc,ferr 

## 2012-05-03 ENCOUNTER — Encounter (HOSPITAL_COMMUNITY): Payer: Medicare FFS | Attending: Oncology

## 2012-05-03 DIAGNOSIS — D509 Iron deficiency anemia, unspecified: Secondary | ICD-10-CM | POA: Insufficient documentation

## 2012-05-03 LAB — CBC
HCT: 38.1 % (ref 36.0–46.0)
MCV: 87 fL (ref 78.0–100.0)
Platelets: 248 10*3/uL (ref 150–400)
RBC: 4.38 MIL/uL (ref 3.87–5.11)
RDW: 14 % (ref 11.5–15.5)
WBC: 7.9 10*3/uL (ref 4.0–10.5)

## 2012-05-03 LAB — FERRITIN: Ferritin: 229 ng/mL (ref 10–291)

## 2012-05-03 NOTE — Progress Notes (Signed)
Kristina Rodriguez presented for labwork. Labs per MD order drawn via Peripheral Line 23 gauge needle inserted in right AC  Good blood return present. Procedure without incident.  Needle removed intact. Patient tolerated procedure well.

## 2012-05-05 ENCOUNTER — Encounter (HOSPITAL_COMMUNITY): Payer: Self-pay | Admitting: Oncology

## 2012-05-05 ENCOUNTER — Encounter (HOSPITAL_BASED_OUTPATIENT_CLINIC_OR_DEPARTMENT_OTHER): Payer: Medicare FFS | Admitting: Oncology

## 2012-05-05 VITALS — BP 110/69 | HR 101 | Temp 97.8°F | Wt 151.8 lb

## 2012-05-05 DIAGNOSIS — K909 Intestinal malabsorption, unspecified: Secondary | ICD-10-CM

## 2012-05-05 DIAGNOSIS — D509 Iron deficiency anemia, unspecified: Secondary | ICD-10-CM

## 2012-05-05 DIAGNOSIS — E119 Type 2 diabetes mellitus without complications: Secondary | ICD-10-CM

## 2012-05-05 NOTE — Patient Instructions (Addendum)
Kristina Rodriguez  [id number] [birth year] Dr. Glenford Peers    [facility]  Discharge Instructions  RECOMMENDATIONS MADE BY THE CONSULTANT AND ANY TEST RESULTS WILL BE SENT TO YOUR REFERRING DOCTOR.   EXAM FINDINGS BY MD TODAY AND SIGNS AND SYMPTOMS TO REPORT TO CLINIC OR PRIMARY MD: your iron level and hemoglobin is good right now.  Not sure what the increased fatigue is related to.  See Dr. [name] about the fatigue.  MEDICATIONS PRESCRIBED: none   INSTRUCTIONS GIVEN AND DISCUSSED: Other :  Report increased ice intake, increased craving for starchy foods or shortness of breath.  SPECIAL INSTRUCTIONS/FOLLOW-UP: Lab work Needed in October and Return to Clinic to see MD in 6 months.   I acknowledge that I have been informed and understand all the instructions given to me and received a copy. I do not have any more questions at this time, but understand that I may call the Specialty Clinic at [facility] at [phone] during business hours should I have any further questions or need assistance in obtaining follow-up care.    __________________________________________  _____________  __________ Signature of Patient or Authorized Representative            Date                   Time    __________________________________________ Nurse's Signature

## 2012-05-05 NOTE — Progress Notes (Signed)
Problem #1 iron deficiency anemia secondary to malabsorption requiring IV iron therapy intermittently. She has an extra response to therapy. Her ferritin has dropped from 400-229 over the last 4 months with her most recent iron infusion in late January 2013. Her hemoglobins remain normal white count and platelets remain normal.  She is having problems with her diabetes and I prefer her back to Dr. [name].  We will check her CBC and ferritin in 4 months and I will see her in 6 months sooner if need a

## 2012-08-30 ENCOUNTER — Encounter (HOSPITAL_COMMUNITY): Payer: Medicare FFS | Attending: Oncology

## 2012-08-30 DIAGNOSIS — D509 Iron deficiency anemia, unspecified: Secondary | ICD-10-CM

## 2012-08-30 LAB — CBC
HCT: 39.4 % (ref 36.0–46.0)
Hemoglobin: 13.1 g/dL (ref 12.0–15.0)
MCH: 29.1 pg (ref 26.0–34.0)
MCHC: 33.2 g/dL (ref 30.0–36.0)
MCV: 87.6 fL (ref 78.0–100.0)
Platelets: 246 10*3/uL (ref 150–400)
RBC: 4.5 MIL/uL (ref 3.87–5.11)
RDW: 13.8 % (ref 11.5–15.5)
WBC: 6.8 10*3/uL (ref 4.0–10.5)

## 2012-08-30 LAB — FERRITIN: Ferritin: 173 ng/mL (ref 10–291)

## 2012-08-30 NOTE — Progress Notes (Signed)
Labs drawn today for cbc,ferr 

## 2012-10-04 ENCOUNTER — Other Ambulatory Visit (HOSPITAL_COMMUNITY): Payer: Self-pay | Admitting: Internal Medicine

## 2012-10-04 DIAGNOSIS — Z09 Encounter for follow-up examination after completed treatment for conditions other than malignant neoplasm: Secondary | ICD-10-CM

## 2012-10-10 DIAGNOSIS — D249 Benign neoplasm of unspecified breast: Secondary | ICD-10-CM

## 2012-10-10 HISTORY — DX: Benign neoplasm of unspecified breast: D24.9

## 2012-10-10 HISTORY — PX: BREAST BIOPSY: SHX20

## 2012-10-13 ENCOUNTER — Other Ambulatory Visit (HOSPITAL_COMMUNITY): Payer: Self-pay | Admitting: Internal Medicine

## 2012-10-13 ENCOUNTER — Ambulatory Visit (HOSPITAL_COMMUNITY)
Admission: RE | Admit: 2012-10-13 | Discharge: 2012-10-13 | Disposition: A | Discharge status: Home / Self Care | Payer: Medicare FFS | Source: Ambulatory Visit | Attending: Internal Medicine | Admitting: Internal Medicine

## 2012-10-13 DIAGNOSIS — Z09 Encounter for follow-up examination after completed treatment for conditions other than malignant neoplasm: Secondary | ICD-10-CM

## 2012-10-13 DIAGNOSIS — R928 Other abnormal and inconclusive findings on diagnostic imaging of breast: Secondary | ICD-10-CM | POA: Insufficient documentation

## 2012-10-13 DIAGNOSIS — N63 Unspecified lump in unspecified breast: Secondary | ICD-10-CM

## 2012-10-20 ENCOUNTER — Other Ambulatory Visit (HOSPITAL_COMMUNITY): Payer: Self-pay | Admitting: Internal Medicine

## 2012-10-20 ENCOUNTER — Ambulatory Visit (HOSPITAL_COMMUNITY)
Admission: RE | Admit: 2012-10-20 | Discharge: 2012-10-20 | Disposition: A | Discharge status: Home / Self Care | Payer: Medicare FFS | Source: Ambulatory Visit | Attending: Internal Medicine | Admitting: Internal Medicine

## 2012-10-20 DIAGNOSIS — N63 Unspecified lump in unspecified breast: Secondary | ICD-10-CM

## 2012-10-20 DIAGNOSIS — D249 Benign neoplasm of unspecified breast: Secondary | ICD-10-CM | POA: Insufficient documentation

## 2012-10-20 DIAGNOSIS — Z09 Encounter for follow-up examination after completed treatment for conditions other than malignant neoplasm: Secondary | ICD-10-CM

## 2012-10-20 NOTE — Progress Notes (Signed)
Lidocaine 2%         10mL injected                         Left breast biopsy performed

## 2012-11-01 ENCOUNTER — Ambulatory Visit (HOSPITAL_COMMUNITY): Payer: Medicare FFS | Admitting: Oncology

## 2012-11-18 ENCOUNTER — Encounter: Payer: Self-pay | Admitting: *Deleted

## 2012-11-19 ENCOUNTER — Encounter: Payer: Self-pay | Admitting: Cardiology

## 2012-11-19 ENCOUNTER — Encounter: Payer: Self-pay | Admitting: *Deleted

## 2012-11-19 ENCOUNTER — Ambulatory Visit (INDEPENDENT_AMBULATORY_CARE_PROVIDER_SITE_OTHER): Payer: No Typology Code available for payment source | Admitting: Cardiology

## 2012-11-19 VITALS — BP 115/71 | HR 92 | Ht 65.5 in | Wt 150.0 lb

## 2012-11-19 DIAGNOSIS — E119 Type 2 diabetes mellitus without complications: Secondary | ICD-10-CM | POA: Insufficient documentation

## 2012-11-19 DIAGNOSIS — M1990 Unspecified osteoarthritis, unspecified site: Secondary | ICD-10-CM | POA: Insufficient documentation

## 2012-11-19 DIAGNOSIS — E785 Hyperlipidemia, unspecified: Secondary | ICD-10-CM

## 2012-11-19 DIAGNOSIS — D509 Iron deficiency anemia, unspecified: Secondary | ICD-10-CM | POA: Insufficient documentation

## 2012-11-19 DIAGNOSIS — R079 Chest pain, unspecified: Secondary | ICD-10-CM

## 2012-11-19 DIAGNOSIS — I10 Essential (primary) hypertension: Secondary | ICD-10-CM | POA: Insufficient documentation

## 2012-11-19 NOTE — Assessment & Plan Note (Signed)
Blood pressure control is excellent with current medication.

## 2012-11-19 NOTE — Patient Instructions (Addendum)
Your physician has recommended you make the following change in your medication: As needed  Your physician has requested that you have a lexiscan myoview. For further information please visit [email or web address] Please follow instruction sheet, as given.

## 2012-11-19 NOTE — Progress Notes (Deleted)
Name: Kristina Rodriguez    DOB: 11-01-1941  Age: 72 y.o.  MR#: [id number]       PCP:  Carylon Perches, MD      Insurance: @PAYORNAME @   CC:   No chief complaint on file.   VS BP 115/71  Pulse 92  Ht 5' 5.5" (1.664 m)  Wt 150 lb (68.04 kg)  BMI 24.58 kg/m2  SpO2 94%  Weights Current Weight  11/19/12 150 lb (68.04 kg)  05/05/12 151 lb 12.8 oz (68.856 kg)  12/03/11 162 lb 3.2 oz (73.573 kg)    Blood Pressure  BP Readings from Last 3 Encounters:  11/19/12 115/71  05/05/12 110/69  12/03/11 112/60     Admit date:  (Not on file) Last encounter with RMR:  Visit date not found   Allergy Allergies  Allergen Reactions  . Codeine Nausea Only    Current Outpatient Prescriptions  Medication Sig Dispense Refill  . ALPRAZolam (XANAX) 1 MG tablet Take 1 mg by mouth 3 (three) times daily as needed.       Marland Kitchen atorvastatin (LIPITOR) 40 MG tablet       . cyclobenzaprine (FLEXERIL) 10 MG tablet Take 10 mg by mouth at bedtime.       Tery Sanfilippo Calcium (STOOL SOFTENER PO) Take 4 tablets by mouth at bedtime.        Marland Kitchen esomeprazole (NEXIUM) 40 MG capsule Take 40 mg by mouth daily before breakfast.        . fenofibrate (TRICOR) 145 MG tablet Take 145 mg by mouth daily.        Marland Kitchen glipiZIDE-metformin (METAGLIP) 2.5-500 MG per tablet Take 2 tablets by mouth 2 (two) times daily before a meal.       . Liraglutide (VICTOZA) 18 MG/3ML SOLN Inject 1.2 mLs into the skin at bedtime.      Marland Kitchen losartan (COZAAR) 50 MG tablet Take 100 mg by mouth daily.       . Omega-3 Fatty Acids (FISH OIL) 1000 MG CAPS Take 3 capsules by mouth at bedtime.        . pregabalin (LYRICA) 75 MG capsule Take 75 mg by mouth daily.       . traMADol (ULTRAM) 50 MG tablet         Discontinued Meds:    Medications Discontinued During This Encounter  Medication Reason  . BIOTIN PO Error  . lidocaine (LIDODERM) 5 % Error  . simvastatin (ZOCOR) 80 MG tablet Error  . traMADol-acetaminophen (ULTRACET) 37.5-325 MG per tablet Error    Patient  Active Problem List  Diagnosis  . Diabetes mellitus, type II  . Hypertension  . Hyperlipidemia  . Osteoarthritis  . Iron deficiency anemia    LABS Infusion on 08/30/2012  Component Date Value  . WBC 08/30/2012 6.8   . RBC 08/30/2012 4.50   . Hemoglobin 08/30/2012 13.1   . HCT 08/30/2012 39.4   . MCV 08/30/2012 87.6   . [facility] 08/30/2012 29.1   . MCHC 08/30/2012 33.2   . RDW 08/30/2012 13.8   . Platelets 08/30/2012 246   . Ferritin 08/30/2012 173      Results for this Opt Visit:     Results for orders placed in visit on 08/30/12  CBC      Component Value Range   WBC 6.8  4.0 - 10.5 K/uL   RBC 4.50  3.87 - 5.11 MIL/uL   Hemoglobin 13.1  12.0 - 15.0 g/dL   HCT 62.1  30.8 - 65.7 %  MCV 87.6  78.0 - 100.0 fL   MCH 29.1  26.0 - 34.0 pg   MCHC 33.2  30.0 - 36.0 g/dL   RDW 16.1  09.6 - 04.5 %   Platelets 246  150 - 400 K/uL  FERRITIN      Component Value Range   Ferritin 173  10 - 291 ng/mL    EKG No orders found for this or any previous visit.   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 This was found to be concordant by Dr. Laveda Abbe. Pathology was relayed by telephone. The patient reported doing well after the biopsy. Post biopsy instructions were reviewed and her questions were answered. She was encouraged to call [facility] for any additional concerns. She was asked to return in 6 months for a left breast diagnostic mammogram and possible ultrasound.  Pathology results are dictated by Sonnie Alamo RN, BSN on October 21, 2012.  **END ADDENDUM** SIGNED BY: Dina L. Judyann Munson, M.D.   10/20/2012   *RADIOLOGY REPORT*  Clinical Data:  Suspicious left breast mass  ULTRASOUND GUIDED VACUUM ASSISTED CORE BIOPSY OF THE LEFT BREAST  Comparison: Previous exams.  I met with the patient and we discussed the procedure of ultrasound- guided biopsy, including benefits and alternatives.  We discussed the high likelihood of a successful procedure. We discussed the risks of the procedure including infection, bleeding, tissue injury, clip migration, and inadequate sampling.  Informed written consent was given.  Using sterile technique, 2% lidocaine ultrasound guidance and a 12 gauge vacuum assisted needle biopsy was performed of a mass in the 1 o'clock region of the left breast using a lateral approach.  At the conclusion of the procedure, a ribbon shaped tissue marker clip was deployed into the biopsy cavity.  Follow-up 2-view mammogram was performed and dictated separately.  IMPRESSION: Ultrasound-guided biopsy of the left breast.  No apparent complications.   Original Report Authenticated By: Baird Lyons, M.D.    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 This was found to be concordant by Dr. Laveda Abbe. Pathology was relayed by telephone. The patient reported doing well after the biopsy. Post biopsy instructions were reviewed and her questions were answered. She was encouraged to call [facility] for any additional concerns. She was asked to return in 6 months for a left breast diagnostic mammogram and possible ultrasound.  Pathology results are dictated by Sonnie Alamo RN, BSN on October 21, 2012.  **END ADDENDUM** SIGNED BY: Dina L. Judyann Munson, M.D.   10/20/2012  *RADIOLOGY REPORT*  Clinical Data:  Suspicious left breast mass  ULTRASOUND GUIDED VACUUM ASSISTED CORE BIOPSY OF THE LEFT BREAST  Comparison: Previous exams.  I met with the patient and we discussed the procedure of  ultrasound- guided biopsy, including benefits and alternatives.  We discussed the high likelihood of a successful procedure. We discussed the risks of the procedure including infection, bleeding, tissue injury, clip migration, and inadequate sampling.  Informed  written consent was given.  Using sterile technique, 2% lidocaine ultrasound guidance and a 12 gauge vacuum assisted needle biopsy was performed of a mass in the 1 o'clock region of the left breast using a lateral approach.  At the conclusion of the procedure, a ribbon shaped tissue marker clip was deployed into the biopsy cavity.  Follow-up 2-view mammogram was performed and dictated separately.  IMPRESSION: Ultrasound-guided biopsy of the left breast.  No apparent complications.   Original Report Authenticated By: Baird Lyons, M.D.    Mm Digital Diagnostic Unilat L  10/20/2012  *RADIOLOGY REPORT*  Clinical Data:  Status post ultrasound guided core biopsy of a suspicious left breast mass.  DIGITAL DIAGNOSTIC LEFT MAMMOGRAM  Comparison:  Previous exams.  Findings:  Films are performed following ultrasound guided biopsy of a mass in the 1 o'clock region of the left breast.  Mammographic images demonstrate there is a ribbon shaped InRad clip in the 12-01 o'clock region of the left breast.  The clip appears to be located anteriorly to the questioned mammographic abnormality. The mass in the 1 o'clock region of the left breast seen sonographically was felt to be adequately biopsied.  Pathologic correlation is recommended.  IMPRESSION: Status post ultrasound-guided core biopsy of the left breast with pathology pending.   Original Report Authenticated By: Baird Lyons, M.D.      [facility] Calculation: Score not calculated. Missing: Total Cholesterol, HDL

## 2012-11-19 NOTE — Assessment & Plan Note (Signed)
Excellent control of hyperlipidemia with current therapy.

## 2012-11-19 NOTE — Progress Notes (Signed)
Patient ID: Kristina Rodriguez, female   DOB: 09-14-1941, 72 y.o.   MRN: [id number]  HPI: Initial Cardiology evaluation performed at the kind request of Dr. Carylon Perches for assessment of chest pain. Patient describes the onset of symptoms as occurring approximately 6 weeks ago when she noticed sudden severe upper mid substernal chest discomfort without radiation. She is unable to characterize the nature of the pain. There were no associated symptoms. Discomfort resolved within a minute or 2. She experienced 2 subsequent spells, both at rest, with decreased intensity of discomfort and has been free of symptoms for the past few weeks. She's had no exercise intolerance and has otherwise felt fine between spells.  Current Outpatient Prescriptions on File Prior to Visit  Medication Sig Dispense Refill  . ALPRAZolam (XANAX) 1 MG tablet Take 1 mg by mouth 3 (three) times daily as needed.       Marland Kitchen atorvastatin (LIPITOR) 40 MG tablet       . cyclobenzaprine (FLEXERIL) 10 MG tablet Take 10 mg by mouth at bedtime.       Tery Sanfilippo Calcium (STOOL SOFTENER PO) Take 4 tablets by mouth at bedtime.        Marland Kitchen esomeprazole (NEXIUM) 40 MG capsule Take 40 mg by mouth daily before breakfast.        . fenofibrate (TRICOR) 145 MG tablet Take 145 mg by mouth daily.        Marland Kitchen glipiZIDE-metformin (METAGLIP) 2.5-500 MG per tablet Take 2 tablets by mouth 2 (two) times daily before a meal.       . Liraglutide (VICTOZA) 18 MG/3ML SOLN Inject 1.2 mLs into the skin at bedtime.      Marland Kitchen losartan (COZAAR) 50 MG tablet Take 100 mg by mouth daily.       . Omega-3 Fatty Acids (FISH OIL) 1000 MG CAPS Take 3 capsules by mouth at bedtime.        . pregabalin (LYRICA) 75 MG capsule Take 75 mg by mouth daily.       . traMADol (ULTRAM) 50 MG tablet        Allergies  Allergen Reactions  . Codeine Nausea Only   Past Medical History  Diagnosis Date  . Diabetes mellitus, type II   . Hypertension   . Hyperlipidemia   . Osteoarthritis   . Iron  deficiency anemia     attributed to long-term treatment with a PPI  . Anxiety   . Hepatic steatosis 2006    mild  . Cholelithiasis 2006    Acute and chronic cholecystitis; laparoscopic cholecystectomy in 2006  . 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 Tubal ligation   . Tonsillectomy   . Carpal tunnel release     Right  . Cholecystectomy, laparoscopic 2006    Dr. Malvin Johns; cholelithiasis  . Colonoscopy 2003    Najeeb Rehman;iron deficiency anemia; normal study; hiatal hernia, gastritis, Schatzki's ring on EGD  . Abdominal hysterectomy   . Breast biopsy 10/2012    Left; fibroadenoma    Family History  Problem Relation Age of Onset  . Cancer Mother   . Diabetes Brother     History   Social History  . Marital Status: Married    Spouse Name: N/A    Number of Children: N/A  . Years of Education: N/A   Occupational History  . Not on file.   Social History Main Topics  .  Smoking status: Former Games developer  . Smokeless tobacco: Never Used  . Alcohol Use: No     Comment: many years ago, none now  . Drug Use: No  . Sexually Active:    Other Topics Concern  . Not on file   Social History Narrative  . No narrative on file   ROS: patient notes mild chronic sinus congestion, intermittent photic pain, occasional palpitations, intermittent pedal edema, intermittent leg discomfort, constipation, negative colonoscopy in 2004, arthritic discomfort, particularly of the hands and back, myalgias, mild orthostatic dizziness after prolonged sitting, chronic anemia with an intolerance to oral iron. All other systems reviewed and are negative.  PHYSICAL EXAM: BP 115/71  Pulse 92  Ht 5' 5.5" (1.664 m)  Wt 68.04 kg (150 lb)  BMI 24.58 kg/m2  SpO2 94%  General-Well-developed; no acute distress; thinning hair Body Habitus-proportionate weight and height HEENT-[place]/AT; PERRL; EOM intact; conjunctiva and lids  nl Neck-No JVD; no carotid bruits Endocrine-No thyromegaly Lungs-Clear lung fields; resonant percussion; normal I-to-E ratio Cardiovascular- normal PMI; normal S1 and S2; minimal systolic ejection murmur Abdomen-BS normal; soft and non-tender without masses or organomegaly Musculoskeletal-No deformities, cyanosis or clubbing Neurologic-Nl cranial nerves; symmetric strength and tone Skin- Warm, no significant lesions Extremities-Nl distal pulses; 1/2+ ankle edema  EKG Tracing performed 09/21/2012 obtained and reviewed: normal sinus rhythm; low-voltage; rightward axis; slightly delayed R-wave progression; minor nonspecific T wave abnormality. No previous tracing for comparison.  ASSESSMENT AND PLAN:  Lyncourt Bing, MD 11/19/2012 12:25 PM

## 2012-11-25 ENCOUNTER — Encounter (HOSPITAL_COMMUNITY)
Admission: RE | Admit: 2012-11-25 | Discharge: 2012-11-25 | Disposition: A | Discharge status: Home / Self Care | Payer: Medicare Other | Source: Ambulatory Visit | Attending: Cardiology | Admitting: Cardiology

## 2012-11-25 ENCOUNTER — Encounter (HOSPITAL_COMMUNITY): Payer: Self-pay

## 2012-11-25 ENCOUNTER — Encounter (HOSPITAL_COMMUNITY): Admission: RE | Admit: 2012-11-25 | Payer: Medicare Other | Source: Ambulatory Visit

## 2012-11-25 ENCOUNTER — Other Ambulatory Visit: Payer: Self-pay | Admitting: Cardiology

## 2012-11-25 ENCOUNTER — Ambulatory Visit (HOSPITAL_COMMUNITY)
Admission: RE | Admit: 2012-11-25 | Discharge: 2012-11-25 | Disposition: A | Discharge status: Home / Self Care | Payer: Medicare Other | Source: Ambulatory Visit | Attending: Internal Medicine | Admitting: Internal Medicine

## 2012-11-25 DIAGNOSIS — E119 Type 2 diabetes mellitus without complications: Secondary | ICD-10-CM | POA: Insufficient documentation

## 2012-11-25 DIAGNOSIS — R079 Chest pain, unspecified: Secondary | ICD-10-CM

## 2012-11-25 DIAGNOSIS — I10 Essential (primary) hypertension: Secondary | ICD-10-CM | POA: Insufficient documentation

## 2012-11-25 HISTORY — DX: Systemic involvement of connective tissue, unspecified: M35.9

## 2012-11-25 MED ORDER — REGADENOSON 0.4 MG/5ML IV SOLN
INTRAVENOUS | Status: AC
  Filled 2012-11-25: qty 5

## 2012-11-25 MED ORDER — SODIUM CHLORIDE 0.9 % IJ SOLN
INTRAMUSCULAR | Status: AC
  Filled 2012-11-25: qty 10

## 2012-11-25 MED ORDER — TECHNETIUM TC 99M SESTAMIBI - CARDIOLITE: 30.0000 | Freq: Once | INTRAVENOUS | Status: DC | PRN

## 2012-11-25 MED ORDER — TECHNETIUM TC 99M SESTAMIBI - CARDIOLITE
10.0000 | Freq: Once | INTRAVENOUS | Status: AC | PRN
  Administered 2012-11-25: 09:00:00 9.7 via INTRAVENOUS

## 2012-11-25 NOTE — Progress Notes (Signed)
Stress Lab Nurses Notes - Kristina Rodriguez  Kristina Rodriguez 11/25/2012  Reason for doing test: Chest Pain  Type of test: [name]  Nurse performing test: Marlena Clipper, RN  Nuclear Medicine Tech: Lyndel Pleasure  Echo Tech: Not Applicable  MD performing test: Joni Reining NP  Family MD:  [name]  Test explained and consent signed: yes  IV started: 22g jelco Flushed with NS  Symptoms: none  Treatment/Intervention: None  Reason test stopped: protocol completed  After recovery IV was: Discontinued via X-ray tech and No redness or edema Flushed with NS  Patient to return to Nuc. Med at : 12:45  Patient discharged: Home  Patient's Condition upon discharge was: stable  Comments: Patient had lexiscan performed, Resting Hr was 100 and BP was 128/63 and peak BP was 148/69. Symptoms resolved in recovery  [name]

## 2012-11-30 ENCOUNTER — Encounter (HOSPITAL_COMMUNITY)
Admission: RE | Admit: 2012-11-30 | Discharge: 2012-11-30 | Disposition: A | Discharge status: Home / Self Care | Payer: Medicare Other | Source: Ambulatory Visit | Attending: Cardiology | Admitting: Cardiology

## 2012-11-30 ENCOUNTER — Encounter (HOSPITAL_COMMUNITY): Payer: Self-pay

## 2012-11-30 ENCOUNTER — Ambulatory Visit (HOSPITAL_COMMUNITY)
Admission: RE | Admit: 2012-11-30 | Discharge: 2012-11-30 | Disposition: A | Discharge status: Home / Self Care | Payer: Medicare Other | Source: Ambulatory Visit | Attending: Internal Medicine | Admitting: Internal Medicine

## 2012-11-30 ENCOUNTER — Encounter (HOSPITAL_COMMUNITY): Payer: Self-pay | Admitting: Cardiology

## 2012-11-30 DIAGNOSIS — E785 Hyperlipidemia, unspecified: Secondary | ICD-10-CM | POA: Insufficient documentation

## 2012-11-30 DIAGNOSIS — I10 Essential (primary) hypertension: Secondary | ICD-10-CM | POA: Insufficient documentation

## 2012-11-30 DIAGNOSIS — R079 Chest pain, unspecified: Secondary | ICD-10-CM | POA: Insufficient documentation

## 2012-11-30 DIAGNOSIS — E119 Type 2 diabetes mellitus without complications: Secondary | ICD-10-CM | POA: Insufficient documentation

## 2012-11-30 MED ORDER — TECHNETIUM TC 99M SESTAMIBI - CARDIOLITE
25.0000 | Freq: Once | INTRAVENOUS | Status: AC | PRN
  Administered 2012-11-30: 25 via INTRAVENOUS

## 2012-11-30 MED ORDER — SODIUM CHLORIDE 0.9 % IJ SOLN
INTRAMUSCULAR | Status: AC
  Administered 2012-11-30: 10 mL via INTRAVENOUS
  Filled 2012-11-30: qty 10

## 2012-11-30 MED ORDER — REGADENOSON 0.4 MG/5ML IV SOLN
INTRAVENOUS | Status: AC
  Administered 2012-11-30: 0.4 mg via INTRAVENOUS
  Filled 2012-11-30: qty 5

## 2012-11-30 NOTE — Progress Notes (Signed)
Stress Lab Nurses Notes - [facility]  SHARRY BEINING 11/30/2012 Reason for doing test: Chest Pain Type of test: Marlane Hatcher Nurse performing test: Parke Poisson, RN Nuclear Medicine Tech: Lyndel Pleasure Echo Tech: Not Applicable MD performing test: Ival Bible & Joni Reining NP Family MD: [name] Test explained and consent signed: yes IV started: 22g jelco, Saline lock flushed and No redness or edema Symptoms: Chest discomfort Treatment/Intervention: None Reason test stopped: protocol completed After recovery IV was: Discontinued via X-ray tech and No redness or edema Patient to return to Nuc. Med at : 10:15 Patient discharged: Home Patient's Condition upon discharge was: stable Comments: Repeating test due to IV infiltration on Thursday 11-25-12.  During test BP 143/59 & HR 126.  Recovery BP 148/57 & HR 120.  Symptoms resolved in recovery. Erskine Speed T

## 2013-01-04 ENCOUNTER — Other Ambulatory Visit (HOSPITAL_COMMUNITY): Payer: Medicare FFS

## 2013-01-05 ENCOUNTER — Ambulatory Visit (HOSPITAL_COMMUNITY): Payer: Medicare FFS | Admitting: Oncology

## 2013-01-12 ENCOUNTER — Other Ambulatory Visit (HOSPITAL_COMMUNITY): Payer: Self-pay | Admitting: Oncology

## 2013-01-12 ENCOUNTER — Encounter (HOSPITAL_COMMUNITY): Payer: Medicare Other | Attending: Oncology

## 2013-01-12 DIAGNOSIS — D509 Iron deficiency anemia, unspecified: Secondary | ICD-10-CM

## 2013-01-12 LAB — CBC
HCT: 40.3 % (ref 36.0–46.0)
MCH: 28.3 pg (ref 26.0–34.0)
MCHC: 33.3 g/dL (ref 30.0–36.0)
MCV: 85 fL (ref 78.0–100.0)
RDW: 13.9 % (ref 11.5–15.5)

## 2013-01-12 NOTE — Progress Notes (Signed)
Labs drawn today for cbc,ferr 

## 2013-01-13 ENCOUNTER — Ambulatory Visit (HOSPITAL_COMMUNITY): Payer: Self-pay | Admitting: Oncology

## 2013-01-14 ENCOUNTER — Ambulatory Visit (HOSPITAL_COMMUNITY): Payer: Self-pay

## 2013-01-26 ENCOUNTER — Encounter (HOSPITAL_BASED_OUTPATIENT_CLINIC_OR_DEPARTMENT_OTHER): Payer: Medicare Other | Admitting: Oncology

## 2013-01-26 ENCOUNTER — Encounter (HOSPITAL_COMMUNITY): Payer: Self-pay | Admitting: Oncology

## 2013-01-26 VITALS — BP 128/76 | HR 102 | Temp 98.4°F | Resp 16 | Wt 147.3 lb

## 2013-01-26 DIAGNOSIS — E119 Type 2 diabetes mellitus without complications: Secondary | ICD-10-CM

## 2013-01-26 DIAGNOSIS — D509 Iron deficiency anemia, unspecified: Secondary | ICD-10-CM

## 2013-01-26 NOTE — Patient Instructions (Addendum)
[facility] Discharge Instructions  RECOMMENDATIONS MADE BY THE CONSULTANT AND ANY TEST RESULTS WILL BE SENT TO YOUR REFERRING PHYSICIAN.  EXAM FINDINGS BY THE PHYSICIAN TODAY AND SIGNS OR SYMPTOMS TO REPORT TO CLINIC OR PRIMARY PHYSICIAN: Discussion by MD.  Your blood work is stable.  MEDICATIONS PRESCRIBED:  none  INSTRUCTIONS GIVEN AND DISCUSSED: Report increased fatigue, shortness of breath or increased ice intake  SPECIAL INSTRUCTIONS/FOLLOW-UP: Blood work in 6 months then to see PA.  Thank you for choosing [facility] to provide your oncology and hematology care.  To afford each patient quality time with our providers, please arrive at least 15 minutes before your scheduled appointment time.  With your help, our goal is to use those 15 minutes to complete the necessary work-up to ensure our physicians have the information they need to help with your evaluation and healthcare recommendations.    Effective January 1st, 2014, we ask that you re-schedule your appointment with our physicians should you arrive 10 or more minutes late for your appointment.  We strive to give you quality time with our providers, and arriving late affects you and other patients whose appointments are after yours.    Again, thank you for choosing [facility].  Our hope is that these requests will decrease the amount of time that you wait before being seen by our physicians.       _____________________________________________________________  Should you have questions after your visit to [facility], please contact our office at [phone] between the hours of 8:30 a.m. and 5:00 p.m.  Voicemails left after 4:30 p.m. will not be returned until the following business day.  For prescription refill requests, have your pharmacy contact our office with your prescription refill request.

## 2013-01-26 NOTE — Progress Notes (Signed)
Problem number 1 iron deficiency anemia secondary to malabsorption, requiring IV iron therapy intermittently. She has a superb response to therapy. Her hemoglobin remains perfectly normal. Her ferritin has dropped slightly over the last 5-6 months. We need to repeat it in 5-6 months along with a CBC. Presently however she does not need IV iron just yet.  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 We have given her copies of her lab work. We will see her back in 5-6 months.

## 2013-07-29 ENCOUNTER — Encounter (HOSPITAL_COMMUNITY): Payer: Medicare Other | Attending: Hematology and Oncology

## 2013-07-29 DIAGNOSIS — D509 Iron deficiency anemia, unspecified: Secondary | ICD-10-CM | POA: Insufficient documentation

## 2013-07-29 LAB — CBC
HCT: 38.9 % (ref 36.0–46.0)
MCH: 29.1 pg (ref 26.0–34.0)
MCHC: 33.4 g/dL (ref 30.0–36.0)
MCV: 87 fL (ref 78.0–100.0)
Platelets: 234 10*3/uL (ref 150–400)
RDW: 13.4 % (ref 11.5–15.5)

## 2013-07-29 NOTE — Progress Notes (Signed)
Labs drawn today for cbc/diff,ferr 

## 2013-08-01 ENCOUNTER — Encounter (HOSPITAL_BASED_OUTPATIENT_CLINIC_OR_DEPARTMENT_OTHER): Payer: Medicare Other

## 2013-08-01 ENCOUNTER — Encounter (HOSPITAL_COMMUNITY): Payer: Self-pay

## 2013-08-01 VITALS — BP 118/68 | HR 86 | Temp 98.3°F | Resp 18 | Wt 146.5 lb

## 2013-08-01 DIAGNOSIS — D509 Iron deficiency anemia, unspecified: Secondary | ICD-10-CM

## 2013-08-01 NOTE — Patient Instructions (Addendum)
[facility] Discharge Instructions  RECOMMENDATIONS MADE BY THE CONSULTANT AND ANY TEST RESULTS WILL BE SENT TO YOUR REFERRING PHYSICIAN.  EXAM FINDINGS BY THE PHYSICIAN TODAY AND SIGNS OR SYMPTOMS TO REPORT TO CLINIC OR PRIMARY PHYSICIAN: Exam and findings as discussed by Dr. Zigmund Daniel.  Report increased fatigue, shortness of breath or other problems.  MEDICATIONS PRESCRIBED:  none  INSTRUCTIONS/FOLLOW-UP: Blood work and follow-up in 6 months.  Thank you for choosing [facility] to provide your oncology and hematology care.  To afford each patient quality time with our providers, please arrive at least 15 minutes before your scheduled appointment time.  With your help, our goal is to use those 15 minutes to complete the necessary work-up to ensure our physicians have the information they need to help with your evaluation and healthcare recommendations.    Effective January 1st, 2014, we ask that you re-schedule your appointment with our physicians should you arrive 10 or more minutes late for your appointment.  We strive to give you quality time with our providers, and arriving late affects you and other patients whose appointments are after yours.    Again, thank you for choosing [facility].  Our hope is that these requests will decrease the amount of time that you wait before being seen by our physicians.       _____________________________________________________________  Should you have questions after your visit to [facility], please contact our office at [phone] between the hours of 8:30 a.m. and 5:00 p.m.  Voicemails left after 4:30 p.m. will not be returned until the following business day.  For prescription refill requests, have your pharmacy contact our office with your prescription refill request.

## 2013-08-01 NOTE — Progress Notes (Signed)
[facility] OFFICE PROGRESS NOTE  Carylon Perches, MD [address] [place] [address]  DIAGNOSIS: Iron deficiency anemia - Plan: CBC with Differential, Comprehensive metabolic panel, Ferritin  Chief Complaint  Patient presents with  . Follow-up    CURRENT THERAPY: No active treatment since intravenous iron was administered in January of 2013  INTERVAL HISTORY: Kristina Rodriguez 72 y.o. female returns for followup of iron deficiency secondary to malabsorption due to the concurrent use of proton pump inhibitor (Nexium). He does have occasional days when she feels more fatigued. She denies any craving for ice. Appetite is good with constipation due to chronic use of opioids for joint pain she is taking stool softeners for this. She denies any PND, orthopnea, palpitations, headache, cough, wheezing, lower extremity swelling or redness, vaginal bleeding, hematuria, epistaxis, melena, or hematochezia.  MEDICAL HISTORY: Past Medical History  Diagnosis Date  . Diabetes mellitus, type II   . Hypertension   . Hyperlipidemia   . Osteoarthritis   . Iron deficiency anemia     attributed to long-term treatment with a PPI  . Anxiety and depression   . Hepatic steatosis 2006    mild  . Cholelithiasis 2006    Acute and chronic cholecystitis; laparoscopic cholecystectomy in 2006  . 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 Chronic pain   . Collagen vascular disease     INTERIM HISTORY: has Diabetes mellitus, type II; Hypertension; Hyperlipidemia; Osteoarthritis; and Iron deficiency anemia on her problem list.    ALLERGIES:  is allergic to codeine.  MEDICATIONS: has a current medication list which includes the following prescription(s): alprazolam, atorvastatin, docusate calcium, esomeprazole, fenofibrate, glipizide-metformin, hydrocodone-acetaminophen, losartan, and fish oil.  SURGICAL HISTORY:  Past Surgical History  Procedure  Laterality Date  . Tubal ligation  1970s  . Tonsillectomy    . Carpal tunnel release  1994    Right  . Cholecystectomy, laparoscopic  2006    Dr. Malvin Johns; cholelithiasis  . Colonoscopy  2003    Najeeb Rehman;iron deficiency anemia; normal study; hiatal hernia, gastritis, Schatzki's ring on EGD  . Breast biopsy  10/2012    Left; fibroadenoma    FAMILY HISTORY: family history includes Cancer in her mother; Congestive Heart Failure in her father; Diabetes in her brother; Hemochromatosis in her brother.  SOCIAL HISTORY:  reports that she has never smoked. She has never used smokeless tobacco. She reports that she does not drink alcohol or use illicit drugs.  REVIEW OF SYSTEMS:  Other than that discussed above is noncontributory.  PHYSICAL EXAMINATION: ECOG PERFORMANCE STATUS: 0 - Asymptomatic  Blood pressure 118/68, pulse 86, temperature 98.3 F (36.8 C), temperature source Oral, resp. rate 18, weight 146 lb 8 oz (66.452 kg).  GENERAL:alert, no distress and comfortable SKIN: skin color, texture, turgor are normal, no rashes or significant lesions EYES: normal, Conjunctiva are pink and non-injected, sclera clear OROPHARYNX:no exudate, no erythema and lips, buccal mucosa, and tongue normal  NECK: supple, thyroid normal size, non-tender, without nodularity CHEST: Normal AP diameter. No breast masses. LYMPH:  no palpable lymphadenopathy in the cervical, axillary or inguinal LUNGS: clear to auscultation and percussion with normal breathing effort HEART: regular rate & rhythm and no murmurs and no lower extremity edema ABDOMEN:abdomen soft, non-tender and normal bowel sounds Musculoskeletal:no cyanosis of digits and no clubbing  NEURO: alert & oriented x 3 with fluent speech, no focal motor/sensory deficits   LABORATORY DATA: Infusion on  07/29/2013  Component Date Value Range Status  . WBC 07/29/2013 5.4  4.0 - 10.5 K/uL Final  . RBC 07/29/2013 4.47  3.87 - 5.11 MIL/uL Final  .  Hemoglobin 07/29/2013 13.0  12.0 - 15.0 g/dL Final  . HCT 29/52/8413 38.9  36.0 - 46.0 % Final  . MCV 07/29/2013 87.0  78.0 - 100.0 fL Final  . MCH 07/29/2013 29.1  26.0 - 34.0 pg Final  . MCHC 07/29/2013 33.4  30.0 - 36.0 g/dL Final  . RDW 24/40/1027 13.4  11.5 - 15.5 % Final  . Platelets 07/29/2013 234  150 - 400 K/uL Final  . Ferritin 07/29/2013 143  10 - 291 ng/mL Final   Performed at [facility]     Urinalysis No results found for this basename: colorurine, appearanceur, labspec, phurine, glucoseu, hgbur, bilirubinur, ketonesur, proteinur, urobilinogen, nitrite, leukocytesur    RADIOGRAPHIC STUDIES: No results found.  ASSESSMENT: #1. Iron deficiency anemia secondary to malabsorption, status post iron infusion January 2013, stable   PLAN: #1. Patient is to call should she develop increasing fatigue over the next 6 months. #2. Followup in 6 months with lab tests and physical exam.   All questions were answered. The patient knows to call the clinic with any problems, questions or concerns. We can certainly see the patient much sooner if necessary.  The patient and plan discussed with Alla German A and he is in agreement with the aforementioned.  I spent 30 minutes counseling the patient face to face. The total time spent in the appointment was 25 minutes.    Maurilio Lovely, MD 08/01/2013 2:12 PM

## 2013-09-28 ENCOUNTER — Other Ambulatory Visit (HOSPITAL_COMMUNITY): Payer: Self-pay | Admitting: Internal Medicine

## 2013-09-28 DIAGNOSIS — Z139 Encounter for screening, unspecified: Secondary | ICD-10-CM

## 2013-10-03 ENCOUNTER — Other Ambulatory Visit (HOSPITAL_COMMUNITY): Payer: Self-pay | Admitting: Internal Medicine

## 2013-10-03 DIAGNOSIS — Z09 Encounter for follow-up examination after completed treatment for conditions other than malignant neoplasm: Secondary | ICD-10-CM

## 2013-10-12 ENCOUNTER — Ambulatory Visit (HOSPITAL_COMMUNITY)
Admission: RE | Admit: 2013-10-12 | Discharge: 2013-10-12 | Disposition: A | Discharge status: Home / Self Care | Payer: Medicare Other | Source: Ambulatory Visit | Attending: Internal Medicine | Admitting: Internal Medicine

## 2013-10-12 ENCOUNTER — Other Ambulatory Visit (HOSPITAL_COMMUNITY): Payer: Self-pay | Admitting: Internal Medicine

## 2013-10-12 DIAGNOSIS — Z09 Encounter for follow-up examination after completed treatment for conditions other than malignant neoplasm: Secondary | ICD-10-CM | POA: Insufficient documentation

## 2013-10-12 DIAGNOSIS — D249 Benign neoplasm of unspecified breast: Secondary | ICD-10-CM | POA: Insufficient documentation

## 2013-10-14 ENCOUNTER — Ambulatory Visit (HOSPITAL_COMMUNITY): Payer: Medicare Other

## 2014-01-26 ENCOUNTER — Encounter (HOSPITAL_COMMUNITY): Payer: Medicare HMO | Attending: Hematology and Oncology

## 2014-01-26 DIAGNOSIS — E119 Type 2 diabetes mellitus without complications: Secondary | ICD-10-CM | POA: Diagnosis not present

## 2014-01-26 DIAGNOSIS — F411 Generalized anxiety disorder: Secondary | ICD-10-CM | POA: Diagnosis not present

## 2014-01-26 DIAGNOSIS — K909 Intestinal malabsorption, unspecified: Secondary | ICD-10-CM | POA: Diagnosis not present

## 2014-01-26 DIAGNOSIS — Z09 Encounter for follow-up examination after completed treatment for conditions other than malignant neoplasm: Secondary | ICD-10-CM | POA: Diagnosis present

## 2014-01-26 DIAGNOSIS — E785 Hyperlipidemia, unspecified: Secondary | ICD-10-CM | POA: Insufficient documentation

## 2014-01-26 DIAGNOSIS — F3289 Other specified depressive episodes: Secondary | ICD-10-CM | POA: Diagnosis not present

## 2014-01-26 DIAGNOSIS — M1990 Unspecified osteoarthritis, unspecified site: Secondary | ICD-10-CM | POA: Insufficient documentation

## 2014-01-26 DIAGNOSIS — I10 Essential (primary) hypertension: Secondary | ICD-10-CM | POA: Diagnosis not present

## 2014-01-26 DIAGNOSIS — D509 Iron deficiency anemia, unspecified: Secondary | ICD-10-CM

## 2014-01-26 DIAGNOSIS — F329 Major depressive disorder, single episode, unspecified: Secondary | ICD-10-CM | POA: Insufficient documentation

## 2014-01-26 LAB — COMPREHENSIVE METABOLIC PANEL
ALBUMIN: 3.6 g/dL (ref 3.5–5.2)
ALK PHOS: 37 U/L — AB (ref 39–117)
ALT: 11 U/L (ref 0–35)
AST: 18 U/L (ref 0–37)
BILIRUBIN TOTAL: 0.4 mg/dL (ref 0.3–1.2)
BUN: 22 mg/dL (ref 6–23)
CHLORIDE: 101 meq/L (ref 96–112)
CO2: 28 mEq/L (ref 19–32)
Calcium: 9.6 mg/dL (ref 8.4–10.5)
Creatinine, Ser: 1.06 mg/dL (ref 0.50–1.10)
GFR calc Af Amer: 59 mL/min — ABNORMAL LOW (ref >90)
GFR calc non Af Amer: 51 mL/min — ABNORMAL LOW (ref >90)
Glucose, Bld: 308 mg/dL — ABNORMAL HIGH (ref 70–99)
POTASSIUM: 4.5 meq/L (ref 3.7–5.3)
SODIUM: 140 meq/L (ref 137–147)
Total Protein: 6.9 g/dL (ref 6.0–8.3)

## 2014-01-26 LAB — CBC WITH DIFFERENTIAL/PLATELET
BASOS ABS: 0 10*3/uL (ref 0.0–0.1)
Basophils Relative: 0 % (ref 0–1)
Eosinophils Absolute: 0.1 10*3/uL (ref 0.0–0.7)
Eosinophils Relative: 2 % (ref 0–5)
HEMATOCRIT: 36.1 % (ref 36.0–46.0)
HEMOGLOBIN: 11.9 g/dL — AB (ref 12.0–15.0)
LYMPHS ABS: 1.8 10*3/uL (ref 0.7–4.0)
LYMPHS PCT: 42 % (ref 12–46)
MCH: 29.4 pg (ref 26.0–34.0)
MCHC: 33 g/dL (ref 30.0–36.0)
MCV: 89.1 fL (ref 78.0–100.0)
MONO ABS: 0.2 10*3/uL (ref 0.1–1.0)
MONOS PCT: 5 % (ref 3–12)
NEUTROS ABS: 2.2 10*3/uL (ref 1.7–7.7)
Neutrophils Relative %: 51 % (ref 43–77)
Platelets: 233 10*3/uL (ref 150–400)
RBC: 4.05 MIL/uL (ref 3.87–5.11)
RDW: 13.7 % (ref 11.5–15.5)
WBC: 4.3 10*3/uL (ref 4.0–10.5)

## 2014-01-26 LAB — FERRITIN: Ferritin: 76 ng/mL (ref 10–291)

## 2014-01-26 LAB — RETICULOCYTES
RBC.: 4.05 MIL/uL (ref 3.87–5.11)
RETIC COUNT ABSOLUTE: 77 10*3/uL (ref 19.0–186.0)
Retic Ct Pct: 1.9 % (ref 0.4–3.1)

## 2014-01-26 NOTE — Progress Notes (Signed)
Labs drawn today for cbc/diff,retic,cmp,epo,ferr

## 2014-01-27 ENCOUNTER — Encounter (HOSPITAL_COMMUNITY): Payer: Self-pay

## 2014-01-27 ENCOUNTER — Other Ambulatory Visit (HOSPITAL_COMMUNITY): Payer: Self-pay | Admitting: Hematology and Oncology

## 2014-01-27 ENCOUNTER — Encounter (HOSPITAL_BASED_OUTPATIENT_CLINIC_OR_DEPARTMENT_OTHER): Payer: Medicare HMO

## 2014-01-27 VITALS — BP 123/69 | HR 85 | Temp 98.3°F | Resp 18 | Wt 151.0 lb

## 2014-01-27 DIAGNOSIS — D509 Iron deficiency anemia, unspecified: Secondary | ICD-10-CM

## 2014-01-27 DIAGNOSIS — K909 Intestinal malabsorption, unspecified: Secondary | ICD-10-CM

## 2014-01-27 NOTE — Patient Instructions (Signed)
[facility] Discharge Instructions  RECOMMENDATIONS MADE BY THE CONSULTANT AND ANY TEST RESULTS WILL BE SENT TO YOUR REFERRING PHYSICIAN.  EXAM FINDINGS BY THE PHYSICIAN TODAY AND SIGNS OR SYMPTOMS TO REPORT TO CLINIC OR PRIMARY PHYSICIAN: Exam and findings as discussed by Dr. Barnet Glasgow.  Your iron is low and we need to give you a feraheme infusion.  Will get authorization from your insurance company.  Will tentatively schedule the infusion for 02/03/14.  Report increased fatigue, shortness of breath or other problems.  MEDICATIONS PRESCRIBED:  none  INSTRUCTIONS/FOLLOW-UP: Follow-up with infusion next week and lab and office visit in 6 months.  Thank you for choosing [facility] to provide your oncology and hematology care.  To afford each patient quality time with our providers, please arrive at least 15 minutes before your scheduled appointment time.  With your help, our goal is to use those 15 minutes to complete the necessary work-up to ensure our physicians have the information they need to help with your evaluation and healthcare recommendations.    Effective January 1st, 2014, we ask that you re-schedule your appointment with our physicians should you arrive 10 or more minutes late for your appointment.  We strive to give you quality time with our providers, and arriving late affects you and other patients whose appointments are after yours.    Again, thank you for choosing [facility].  Our hope is that these requests will decrease the amount of time that you wait before being seen by our physicians.       _____________________________________________________________  Should you have questions after your visit to [facility], please contact our office at [phone] between the hours of 8:30 a.m. and 5:00 p.m.  Voicemails left after 4:30 p.m. will not be returned until the following business day.  For prescription refill  requests, have your pharmacy contact our office with your prescription refill request.

## 2014-01-27 NOTE — Progress Notes (Signed)
[facility]  OFFICE PROGRESS Kristina Miner, MD [address] [place] [address]  DIAGNOSIS: Iron deficiency anemia  Malabsorption of iron  Chief Complaint  Patient presents with  . Iron deficiency    Malabsorption    CURRENT THERAPY: Intravenous iron last administered in January/2013.  INTERVAL HISTORY: Kristina Rodriguez 73 y.o. female returns for followup of iron deficiency because of malabsorption due to concurrent use of proton pump inhibitors. She has not noticed any change in her fatigue factor. Appetite is good with no nausea, vomiting, melena, hematochezia, hematuria, vaginal bleeding, epistaxis, or hemoptysis. Urine is normal color. She denies any fever, night sweats, lymphadenopathy, easy satiety, skin rash, joint pain, headache, or seizures.  MEDICAL HISTORY: Past Medical History  Diagnosis Date  . Diabetes mellitus, type II   . Hypertension   . Hyperlipidemia   . Osteoarthritis   . Iron deficiency anemia     attributed to long-term treatment with a PPI  . Anxiety and depression   . Hepatic steatosis 2006    mild  . Cholelithiasis 2006    Acute and chronic cholecystitis; laparoscopic cholecystectomy in 2006  . 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 Chronic pain   . Collagen vascular disease     INTERIM HISTORY: has Diabetes mellitus, type II; Hypertension; Hyperlipidemia; Osteoarthritis; and Iron deficiency anemia on her problem list.    ALLERGIES:  is allergic to codeine.  MEDICATIONS: has a current medication list which includes the following prescription(s): alprazolam, atorvastatin, docusate calcium, esomeprazole, fenofibrate, glipizide, hydrocodone-acetaminophen, losartan, metformin, and pioglitazone.  SURGICAL HISTORY:  Past Surgical History  Procedure Laterality Date  . Tubal ligation  1970s  . Tonsillectomy    . Carpal tunnel release  1994    Right    . Cholecystectomy, laparoscopic  2006    Dr. Romona Curls; cholelithiasis  . Colonoscopy  2003    Kristina Rodriguez;iron deficiency anemia; normal study; hiatal hernia, gastritis, Schatzki's ring on EGD  . Breast biopsy  10/2012    Left; fibroadenoma    FAMILY HISTORY: family history includes Cancer in her mother; Congestive Heart Failure in her father; Diabetes in her brother; Hemochromatosis in her brother.  SOCIAL HISTORY:  reports that she has never smoked. She has never used smokeless tobacco. She reports that she does not drink alcohol or use illicit drugs.  REVIEW OF SYSTEMS:  Other than that discussed above is noncontributory.  PHYSICAL EXAMINATION: ECOG PERFORMANCE STATUS: 1 - Symptomatic but completely ambulatory  Blood pressure 123/69, pulse 85, temperature 98.3 F (36.8 C), temperature source Oral, resp. rate 18, weight 151 lb (68.493 kg).  GENERAL:alert, no distress and comfortable SKIN: skin color, texture, turgor are normal, no rashes or significant lesions EYES: PERLA; Conjunctiva are pink and non-injected, sclera clear SINUSES: No redness or tenderness over maxillary or ethmoid sinuses OROPHARYNX:no exudate, no erythema on lips, buccal mucosa, or tongue. NECK: supple, thyroid normal size, non-tender, without nodularity. No masses CHEST: Normal AP diameter with no breast masses. LYMPH:  no palpable lymphadenopathy in the cervical, axillary or inguinal LUNGS: clear to auscultation and percussion with normal breathing effort HEART: regular rate & rhythm and no murmurs. ABDOMEN:abdomen soft, non-tender and normal bowel sounds MUSCULOSKELETAL:no cyanosis of digits and no clubbing. Range of motion normal.  NEURO: alert & oriented x 3 with fluent speech, no focal motor/sensory deficits   LABORATORY DATA:  Results for Kristina Rodriguez (MRN [id number]) as of 01/27/2014 13:16  Ref. Range 05/03/2012 09:02 08/30/2012 09:44 01/12/2013 12:01 07/29/2013 09:40 01/26/2014 09:20  Ferritin  Latest Range: 10-291 ng/mL 229 173 152 143 76   Infusion on 01/26/2014  Component Date Value Ref Range Status  . WBC 01/26/2014 4.3  4.0 - 10.5 K/uL Final  . RBC 01/26/2014 4.05  3.87 - 5.11 MIL/uL Final  . Hemoglobin 01/26/2014 11.9* 12.0 - 15.0 g/dL Final  . HCT 01/26/2014 36.1  36.0 - 46.0 % Final  . MCV 01/26/2014 89.1  78.0 - 100.0 fL Final  . MCH 01/26/2014 29.4  26.0 - 34.0 pg Final  . MCHC 01/26/2014 33.0  30.0 - 36.0 g/dL Final  . RDW 01/26/2014 13.7  11.5 - 15.5 % Final  . Platelets 01/26/2014 233  150 - 400 K/uL Final  . Neutrophils Relative % 01/26/2014 51  43 - 77 % Final  . Neutro Abs 01/26/2014 2.2  1.7 - 7.7 K/uL Final  . Lymphocytes Relative 01/26/2014 42  12 - 46 % Final  . Lymphs Abs 01/26/2014 1.8  0.7 - 4.0 K/uL Final  . Monocytes Relative 01/26/2014 5  3 - 12 % Final  . Monocytes Absolute 01/26/2014 0.2  0.1 - 1.0 K/uL Final  . Eosinophils Relative 01/26/2014 2  0 - 5 % Final  . Eosinophils Absolute 01/26/2014 0.1  0.0 - 0.7 K/uL Final  . Basophils Relative 01/26/2014 0  0 - 1 % Final  . Basophils Absolute 01/26/2014 0.0  0.0 - 0.1 K/uL Final  . Retic Ct Pct 01/26/2014 1.9  0.4 - 3.1 % Final  . RBC. 01/26/2014 4.05  3.87 - 5.11 MIL/uL Final  . Retic Count, Manual 01/26/2014 77.0  19.0 - 186.0 K/uL Final  . Sodium 01/26/2014 140  137 - 147 mEq/L Final  . Potassium 01/26/2014 4.5  3.7 - 5.3 mEq/L Final  . Chloride 01/26/2014 101  96 - 112 mEq/L Final  . CO2 01/26/2014 28  19 - 32 mEq/L Final  . Glucose, Bld 01/26/2014 308* 70 - 99 mg/dL Final  . BUN 01/26/2014 22  6 - 23 mg/dL Final  . Creatinine, Ser 01/26/2014 1.06  0.50 - 1.10 mg/dL Final  . Calcium 01/26/2014 9.6  8.4 - 10.5 mg/dL Final  . Total Protein 01/26/2014 6.9  6.0 - 8.3 g/dL Final  . Albumin 01/26/2014 3.6  3.5 - 5.2 g/dL Final  . AST 01/26/2014 18  0 - 37 U/L Final  . ALT 01/26/2014 11  0 - 35 U/L Final  . Alkaline Phosphatase 01/26/2014 37* 39 - 117 U/L Final  . Total Bilirubin 01/26/2014 0.4   0.3 - 1.2 mg/dL Final  . GFR calc non Af Amer 01/26/2014 51* >90 mL/min Final  . GFR calc Af Amer 01/26/2014 59* >90 mL/min Final   Comment: (NOTE)                          The eGFR has been calculated using the CKD EPI equation.                          This calculation has not been validated in all clinical situations.                          eGFR's persistently <90 mL/min signify possible Chronic Kidney  Disease.  . Ferritin 01/26/2014 76  10 - 291 ng/mL Final   Performed at [facility]: No new pathology.  Urinalysis No results found for this basename: colorurine,  appearanceur,  labspec,  phurine,  glucoseu,  hgbur,  bilirubinur,  ketonesur,  proteinur,  urobilinogen,  nitrite,  leukocytesur    RADIOGRAPHIC STUDIES: MM Digital Diagnostic Bilat Status: Final result            Study Result    CLINICAL DATA: Followup of biopsy proven benign mass in the left  breast. Patient is 1 year status post biopsy.  EXAM:  DIGITAL DIAGNOSTIC BILATERAL MAMMOGRAM WITH CAD  ULTRASOUND LEFT BREAST  COMPARISON: Multiple priors  ACR Breast Density Category c: The breast tissue is heterogeneously  dense, which may obscure small masses.  FINDINGS:  Mammographically, the questioned mass in the upper central left  breast is unchanged. No new masses, suspicious microcalcifications,  or architectural distortion.  Mammographic images were processed with CAD.  On physical exam, no palpable abnormality is identified in the area  of previously biopsied mass at 1 o'clock, 3 cm from the nipple.  Targeted ultrasound demonstrates no change in the hypoechoic mass at  1 o'clock, 3 cm from the nipple. This measures 10 x 4 x 5 mm  compared with 10 x 8 x 5 mm previously.  IMPRESSION:  No significant change in biopsy proven benign breast mass in 1 year  following biopsy.  RECOMMENDATION:  Bilateral screening mammogram in 1 year  I have discussed the  findings and recommendations with the patient.  Results were also provided in writing at the conclusion of the  visit.  BI-RADS CATEGORY 2: Benign Finding(s)  Electronically Signed  By: Donavan Burnet M.D.  On: 10/12/2013 17:     ASSESSMENT:  #1. Recurrent iron deficiency with minimal anemia secondary to malabsorption, last iron infusion in January 2013.   PLAN:  #1. After appropriate third party approval, the patient will receive  additional Feraheme 1020 mg. #2. Office visit with CBC and ferritin in 6 months. Patient was told to call should she develop worsening fatigue or any other symptoms that are troublesome and persistent.    All questions were answered. The patient knows to call the clinic with any problems, questions or concerns. We can certainly see the patient much sooner if necessary.   I spent 25 minutes counseling the patient face to face. The total time spent in the appointment was 30 minutes.    Doroteo Bradford, MD 01/27/2014 2:22 PM

## 2014-01-30 LAB — ERYTHROPOIETIN: ERYTHROPOIETIN: 17.3 m[IU]/mL (ref 2.6–18.5)

## 2014-02-02 ENCOUNTER — Encounter (HOSPITAL_BASED_OUTPATIENT_CLINIC_OR_DEPARTMENT_OTHER): Payer: Medicare HMO

## 2014-02-02 VITALS — BP 144/69 | HR 82 | Temp 98.1°F | Resp 18

## 2014-02-02 DIAGNOSIS — D508 Other iron deficiency anemias: Secondary | ICD-10-CM

## 2014-02-02 DIAGNOSIS — K9089 Other intestinal malabsorption: Secondary | ICD-10-CM

## 2014-02-02 DIAGNOSIS — D509 Iron deficiency anemia, unspecified: Secondary | ICD-10-CM

## 2014-02-02 MED ORDER — SODIUM CHLORIDE 0.9 % IV SOLN
Freq: Once | INTRAVENOUS | Status: AC
  Administered 2014-02-02: 10:00:00 via INTRAVENOUS

## 2014-02-02 MED ORDER — SODIUM CHLORIDE 0.9 % IJ SOLN: 10.0000 mL | INTRAMUSCULAR | Status: DC | PRN

## 2014-02-02 MED ORDER — SODIUM CHLORIDE 0.9 % IV SOLN
1020.0000 mg | Freq: Once | INTRAVENOUS | Status: AC
  Administered 2014-02-02: 1020 mg via INTRAVENOUS
  Filled 2014-02-02: qty 34

## 2014-02-02 NOTE — Progress Notes (Signed)
..  Kristina Rodriguez Arrives today for feraheme to be given over 1 hr at patients request due to a past experience of dizziness after infusion. Tolerated infusion well.

## 2014-02-03 ENCOUNTER — Ambulatory Visit (HOSPITAL_COMMUNITY): Payer: Commercial Managed Care - HMO

## 2014-04-25 LAB — HEMOGLOBIN A1C: HEMOGLOBIN A1C: 7 % — AB (ref 4.0–6.0)

## 2014-05-05 ENCOUNTER — Other Ambulatory Visit (HOSPITAL_COMMUNITY): Payer: Self-pay | Admitting: Internal Medicine

## 2014-05-05 DIAGNOSIS — R1084 Generalized abdominal pain: Secondary | ICD-10-CM

## 2014-05-09 ENCOUNTER — Ambulatory Visit (HOSPITAL_COMMUNITY)
Admission: RE | Admit: 2014-05-09 | Discharge: 2014-05-09 | Disposition: A | Discharge status: Home / Self Care | Payer: Medicare HMO | Source: Ambulatory Visit | Attending: Internal Medicine | Admitting: Internal Medicine

## 2014-05-09 DIAGNOSIS — K5730 Diverticulosis of large intestine without perforation or abscess without bleeding: Secondary | ICD-10-CM | POA: Insufficient documentation

## 2014-05-09 DIAGNOSIS — Q618 Other cystic kidney diseases: Secondary | ICD-10-CM | POA: Insufficient documentation

## 2014-05-09 DIAGNOSIS — R1084 Generalized abdominal pain: Secondary | ICD-10-CM | POA: Insufficient documentation

## 2014-05-09 DIAGNOSIS — N852 Hypertrophy of uterus: Secondary | ICD-10-CM | POA: Insufficient documentation

## 2014-05-09 LAB — POCT I-STAT CREATININE: CREATININE: 1.2 mg/dL — AB (ref 0.50–1.10)

## 2014-05-09 MED ORDER — IOHEXOL 300 MG/ML  SOLN
100.0000 mL | Freq: Once | INTRAMUSCULAR | Status: AC | PRN
  Administered 2014-05-09: 80 mL via INTRAVENOUS

## 2014-05-10 DIAGNOSIS — C55 Malignant neoplasm of uterus, part unspecified: Secondary | ICD-10-CM

## 2014-05-10 HISTORY — DX: Malignant neoplasm of uterus, part unspecified: C55

## 2014-05-17 ENCOUNTER — Encounter: Payer: Self-pay | Admitting: *Deleted

## 2014-05-17 DIAGNOSIS — F3289 Other specified depressive episodes: Secondary | ICD-10-CM

## 2014-05-17 DIAGNOSIS — G8929 Other chronic pain: Secondary | ICD-10-CM | POA: Insufficient documentation

## 2014-05-17 DIAGNOSIS — F329 Major depressive disorder, single episode, unspecified: Secondary | ICD-10-CM

## 2014-05-18 ENCOUNTER — Other Ambulatory Visit (HOSPITAL_COMMUNITY)
Admission: RE | Admit: 2014-05-18 | Discharge: 2014-05-18 | Disposition: A | Discharge status: Home / Self Care | Payer: Medicare HMO | Source: Ambulatory Visit | Attending: Obstetrics and Gynecology | Admitting: Obstetrics and Gynecology

## 2014-05-18 ENCOUNTER — Other Ambulatory Visit: Payer: Self-pay | Admitting: Obstetrics and Gynecology

## 2014-05-18 ENCOUNTER — Ambulatory Visit (INDEPENDENT_AMBULATORY_CARE_PROVIDER_SITE_OTHER): Payer: Commercial Managed Care - HMO | Admitting: Obstetrics and Gynecology

## 2014-05-18 ENCOUNTER — Encounter: Payer: Self-pay | Admitting: Obstetrics and Gynecology

## 2014-05-18 VITALS — BP 122/80 | Ht 63.0 in | Wt 154.0 lb

## 2014-05-18 DIAGNOSIS — Z124 Encounter for screening for malignant neoplasm of cervix: Secondary | ICD-10-CM

## 2014-05-18 DIAGNOSIS — Z1151 Encounter for screening for human papillomavirus (HPV): Secondary | ICD-10-CM | POA: Insufficient documentation

## 2014-05-18 DIAGNOSIS — N858 Other specified noninflammatory disorders of uterus: Secondary | ICD-10-CM

## 2014-05-18 DIAGNOSIS — N9489 Other specified conditions associated with female genital organs and menstrual cycle: Secondary | ICD-10-CM

## 2014-05-18 NOTE — Progress Notes (Signed)
This chart was scribed by Ludger Nutting, Medical Scribe, for Dr. Mallory Shirk on 05/18/14 at 11:12 AM. This chart was reviewed by Dr. Mallory Shirk for accuracy.  [facility] Visit  Patient name: Kristina Rodriguez MRN [id number]  Date of birth: 1941-07-17  CC & HPI:  Kristina Rodriguez is a 73 y.o. female presenting today referred by Dr. Willey Blade. Patient had an abnormal CT scan on 05/09/14 which was ordered for generalized abdominal pain for the past 5-6 months. She states the pain is worse in the afternoon and during the night. She has associated constipation. She denies modifying factors. She rates her pain as 2-3/10 currently.   ROS:  +abdominal pain +constipation Otherwise negative  Pertinent History Reviewed:   Reviewed: Significant for  Medical                                    Surgical Hx:    Medications: Reviewed & Updated - see associated section Social History: Reviewed -  reports that she has never smoked. She has never used smokeless tobacco.  Objective Findings:  Vitals: Blood pressure 122/80, height 5\' 3"  (1.6 m), weight 154 lb (69.854 kg).  Physical Examination: General appearance - alert, well appearing, and in no distress and oriented to person, place, and time Mental status - alert, oriented to person, place, and time, normal mood, behavior, speech, dress, motor activity, and thought processes Pelvic -  VULVA: normal appearing vulva with no masses, tenderness or lesions,  VAGINA: normal appearing vagina with normal color and discharge, no lesions, Post menopausal atrophic tissues, adequate support of the uterus CERVIX: normal appearing cervix without discharge or lesions, atrophic  UTERUS: enlarged to 10 week's size, anterior, moderately tender, non-purulent  ADNEXA: normal adnexa in size, nontender and no masses   Assessment & Plan:   A: 1. Uterine mass ? endometrial neoplasm   P: 1. Pelvic ultrasound and endometrial biopsy, promptly

## 2014-05-22 ENCOUNTER — Other Ambulatory Visit: Payer: Self-pay | Admitting: Obstetrics and Gynecology

## 2014-05-22 ENCOUNTER — Ambulatory Visit (INDEPENDENT_AMBULATORY_CARE_PROVIDER_SITE_OTHER): Payer: Commercial Managed Care - HMO | Admitting: Obstetrics and Gynecology

## 2014-05-22 ENCOUNTER — Ambulatory Visit (INDEPENDENT_AMBULATORY_CARE_PROVIDER_SITE_OTHER): Payer: Commercial Managed Care - HMO

## 2014-05-22 DIAGNOSIS — N852 Hypertrophy of uterus: Secondary | ICD-10-CM

## 2014-05-22 DIAGNOSIS — N858 Other specified noninflammatory disorders of uterus: Secondary | ICD-10-CM

## 2014-05-22 DIAGNOSIS — N9489 Other specified conditions associated with female genital organs and menstrual cycle: Secondary | ICD-10-CM

## 2014-05-22 DIAGNOSIS — R1084 Generalized abdominal pain: Secondary | ICD-10-CM

## 2014-05-22 DIAGNOSIS — N840 Polyp of corpus uteri: Secondary | ICD-10-CM

## 2014-05-22 LAB — CYTOLOGY - PAP

## 2014-05-22 NOTE — Progress Notes (Signed)
  This chart was scribed for Dr. Mallory Shirk, V, by Neta Ehlers, Scribe, on 05/22/14.     [facility] Visit  Patient name: Kristina Rodriguez MRN [id number]  Date of birth: January 31, 1941  CC & HPI:  Kristina Rodriguez is a 73 y.o. female presenting today for a consultation regarding an endometrial polyp. The pt reports abdominal pain she characterizes as severe which has persisted for 5-6 months, and which  increases at night.   ROS:  +Abdominal pain Otherwise negative   Pertinent History Reviewed:   Reviewed: Significant for  Medical                                    Surgical Hx:    Medications: Reviewed & Updated - see associated section Social History: Reviewed -  reports that she has never smoked. She has never used smokeless tobacco.  Objective Findings:  Vitals: There were no vitals taken for this visit.   Assessment & Plan:    A: 1. Endometrial polyp  P: 1. Return in a week for biopsy pre-eval

## 2014-05-29 ENCOUNTER — Encounter: Payer: Self-pay | Admitting: Internal Medicine

## 2014-05-29 ENCOUNTER — Encounter (HOSPITAL_COMMUNITY): Payer: Self-pay | Admitting: Pharmacy Technician

## 2014-05-30 ENCOUNTER — Encounter: Payer: Self-pay | Admitting: Obstetrics and Gynecology

## 2014-05-30 ENCOUNTER — Ambulatory Visit (INDEPENDENT_AMBULATORY_CARE_PROVIDER_SITE_OTHER): Payer: Commercial Managed Care - HMO | Admitting: Obstetrics and Gynecology

## 2014-05-30 VITALS — BP 130/76 | Ht 63.0 in | Wt 156.0 lb

## 2014-05-30 DIAGNOSIS — N840 Polyp of corpus uteri: Secondary | ICD-10-CM

## 2014-05-30 DIAGNOSIS — N950 Postmenopausal bleeding: Secondary | ICD-10-CM

## 2014-05-30 DIAGNOSIS — N857 Hematometra: Secondary | ICD-10-CM

## 2014-05-30 NOTE — Progress Notes (Signed)
Patient ID: Kristina Rodriguez, female   DOB: [birth year], 73 y.o.   MRN: [id number] Preoperative History and Physical  GERRE Rodriguez is a 73 y.o. No obstetric history on file. here for surgical management of postmenopausal bleeding with preop u/s showing hematometria with a 2.6 cm fundal polyp in the uterus..  Pt states that she has a lot of pain in the evening and at night. Pt here today for pre op exam.   Proposed surgery: hysteroscopy, dilation and curettage, removal of endometrial polyp  Past Medical History  Diagnosis Date  . Diabetes mellitus, type II   . Hypertension   . Hyperlipidemia   . Osteoarthritis   . Iron deficiency anemia     attributed to long-term treatment with a PPI  . Anxiety and depression   . Hepatic steatosis 2006    mild  . Cholelithiasis 2006    Acute and chronic cholecystitis; laparoscopic cholecystectomy in 2006  . 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 Chronic pain   . Collagen vascular disease    Past Surgical History  Procedure Laterality Date  . Tubal ligation  1970s  . Tonsillectomy    . Carpal tunnel release  1994    Right  . Cholecystectomy, laparoscopic  2006    Dr. Romona Curls; cholelithiasis  . Colonoscopy  2003    Najeeb Rehman;iron deficiency anemia; normal study; hiatal hernia, gastritis, Schatzki's ring on EGD  . Breast biopsy  10/2012    Left; fibroadenoma   OB History   Grav Para Term Preterm Abortions TAB SAB Ect Mult Living                 Patient denies any cervical dysplasia or STIs.  (Not in a hospital admission)  Allergies  Allergen Reactions  . Codeine Nausea Only   Social History:   reports that she has never smoked. She has never used smokeless tobacco. She reports that she does not drink alcohol or use illicit drugs. Family History  Problem Relation Age of Onset  . Cancer Mother   . Diabetes Brother   . Congestive Heart Failure Father   . Hemochromatosis Brother     Review of  Systems: Noncontributory  PHYSICAL EXAM: Blood pressure 130/76, height 5\' 3"  (1.6 m), weight 156 lb (70.761 kg). General appearance - alert, well appearing, and in no distress Chest - clear to auscultation, no wheezes, rales or rhonchi, symmetric air entry Heart - normal rate and regular rhythm Abdomen - soft, nontender, nondistended, no masses or organomegaly Pelvic  Physical Examination: Pelvic - VULVA: normal appearing vulva with no masses, tenderness or lesions, vulvar hypopigmentation atrophic , VAGI normal appearing vagina with normal color and discharge, no lesions, atrophic, PELVIC FLOOR EXAM: no cystocele, rectocele or prolapse noted, uterine descensus grade 1 , CERVIX: normal appearing cervix without discharge or lesions, cervical stenosis present, UTERUS: uterus is normal size, shape, consistency and nontender, tenderness  Mild to bimanual, anteverted, enlarged to 4-6 week's size, mobile,  ADNEXA: normal adnexa in size, nontender and no masses  Extremities - peripheral pulses normal, no pedal edema, no clubbing or cyanosis  Labs: Results for orders placed in visit on 05/18/14 (from the past 336 hour(s))  CYTOLOGY - PAP   Collection Time    05/18/14 12:00 AM      Result Value Ref Range   CYTOLOGY - PAP PAP RESULT      Imaging Studies: US Transvaginal Non-ob  31-May-2014  GYNECOLOGIC SONOGRAM   Kristina Rodriguez is a 73 y.o.  for a pelvic sonogram for uterine mass noted  on CT. Pt c/o severe pelvic pain,no vaginal bleeding noted.  Uterus                      10.7 x 7.9 x 6.7 cm, anteverted  Endometrium          Endometrial mass noted with hematometra, mass with  +Doppler flow noted = 4.5cm noted in fundal region of cavity  Right ovary             1.4 x 1.2 x 1.0 cm,   Left ovary                 Unable to positively identify ovarian tissue  No free fluid or adnexal masses noted within the pelvis  Technician Comments:  Anteverted uterus noted with fluid filled endometrial cavity with  4.5cm  fundal solid mass (+Doppler flow noted within) noted within the fundal  region of the cavity, Rt ovary appears WNL, Lt adnexa appears WNL   Kristina Rodriguez 05/22/2014 11:14 AM  Clinical Impression and recommendations:  I have reviewed the sonogram results above.  Combined with the patient's current clinical course, below are my  impressions and any appropriate recommendations for management based on  the sonographic findings:  Large hematometrium, with a mass that is generating the production of  blood or fluid. IMP endometrrium polyp, exccision indicated. Will schedule hysteroscopic  rese ction.  Sorah Falkenstein V   Ct Abdomen Pelvis W Contrast  05/09/2014   CLINICAL DATA:  Generalized abdominal pain  EXAM: CT ABDOMEN AND PELVIS WITH CONTRAST  TECHNIQUE: Multidetector CT imaging of the abdomen and pelvis was performed using the standard protocol following bolus administration of intravenous contrast.  CONTRAST:  62mL OMNIPAQUE IOHEXOL 300 MG/ML  SOLN  COMPARISON:  None.  FINDINGS: The uterus is enlarged with the myometrium thinned. The endometrium or endometrial cavity is distended measuring 9.4 cm in length by 5.5 cm x 7.9 cm. There is a heterogeneous area of enhancement along the dome of the uterus which measures approximately 3.5 cm by 2.4 cm x 4.5 cm. The thin endometrium and overlying serosa is intact.  No adnexal masses. No adenopathy. There is no ascites. No peritoneal or serosal implants are seen.  No lung base nodules.  Heart is normal in size.  Liver, spleen and pancreas are unremarkable. Gallbladder surgically absent. No bile duct dilation. Two small right renal cyst. Kidneys are otherwise unremarkable. Ureters and bladder are normal.  Few small colonic diverticula. The colon is otherwise unremarkable. Normal small bowel. Normal appendix.  There are degenerative changes of the visualized spine mostly at L4-L5. No osteoblastic or osteolytic lesions.  IMPRESSION: 1. The endometrium and/or the  endometrial cavity is significantly distended, enlarging the overall size of the uterus. There is an area of abnormal enhancement along the superior aspect of the endometrium/endometrial canal that may reflect endometrial neoplasm. There is no evidence of neoplastic disease extending beyond the confines of the uterus. 2. No evidence of metastatic disease. 3. No acute findings. 4. Few scattered colonic diverticula.  No diverticulitis. 5. Status post cholecystectomy. Degenerative changes of the visualized spine. Two small right renal cysts.   Electronically Signed   By: Lajean Manes M.D.   On: 05/09/2014 16:05    Assessment: Patient Active Problem List   Diagnosis Date Noted  . Screening for malignant neoplasm of the cervix 05/18/2014  .  Uterine mass 05/18/2014  . Other chronic pain 05/17/2014  . Depressive disorder, not elsewhere classified 05/17/2014  . Diabetes mellitus, type II   . Hypertension   . Hyperlipidemia   . Osteoarthritis   . Iron deficiency anemia     Plan: Patient will undergo surgical management with Hysteroscopy , dilation and curettage, removal of endometriial polyp  .   The risks of surgery were discussed in detail with the patient including but not limited to: bleeding which may require transfusion or reoperation; infection which may require antibiotics; injury to surrounding organs which may involve bowel, bladder, ureters ; need for additional procedures including laparoscopy or laparotomy; thromboembolic phenomenon, surgical site problems and other postoperative/anesthesia complications. Likelihood of success in alleviating the patient's condition was discussed. Routine postoperative instructions will be reviewed with the patient and her family in detail after surgery.  The patient concurred with the proposed plan, giving informed written consent for the surgery.  Patient has been NPO since last night she will remain NPO for procedure.  Anesthesia and OR aware.   Preoperative prophylactic antibiotics and SCDs ordered for on call to the OR.  To OR 7/28  Aleen Marston V \

## 2014-05-31 NOTE — Patient Instructions (Signed)
Kristina Rodriguez  05/31/2014   Your procedure is scheduled on:  06/06/2014  Report to [facility] at  845  AM.  Call this number if you have problems the morning of surgery: [phone]   Remember:   Do not eat food or drink liquids after midnight.   Take these medicines the morning of surgery with A SIP OF WATER:  Xanax, nexium, hydrocodone, cozaar   Do not wear jewelry, make-up or nail polish.  Do not wear lotions, powders, or perfumes.   Do not shave 48 hours prior to surgery. Men may shave face and neck.  Do not bring valuables to the hospital.  [facility] is not responsible for any belongings or valuables.               Contacts, dentures or bridgework may not be worn into surgery.  Leave suitcase in the car. After surgery it may be brought to your room.  For patients admitted to the hospital, discharge time is determined by your treatment team.               Patients discharged the day of surgery will not be allowed to drive home.  Name and phone number of your driver: family  Special Instructions: Shower using CHG 2 nights before surgery and the night before surgery.  If you shower the day of surgery use CHG.  Use special wash - you have one bottle of CHG for all showers.  You should use approximately 1/3 of the bottle for each shower.   Please read over the following fact sheets that you were given: Pain Booklet, Coughing and Deep Breathing, Surgical Site Infection Prevention, Anesthesia Post-op Instructions and Care and Recovery After Surgery Endometrial Ablation Endometrial ablation removes the lining of the uterus (endometrium). It is usually a same-day, outpatient treatment. Ablation helps avoid major surgery, such as surgery to remove the cervix and uterus (hysterectomy). After endometrial ablation, you will have little or no menstrual bleeding and may not be able to have children. However, if you are premenopausal, you will need to use a reliable method of birth  control following the procedure because of the small chance that pregnancy can occur. There are different reasons to have this procedure, which include:  Heavy periods.  Bleeding that is causing anemia.  Irregular bleeding.  Bleeding fibroids on the lining inside the uterus if they are smaller than 3 centimeters. This procedure should not be done if:  You want children in the future.  You have severe cramps with your menstrual period.  You have precancerous or cancerous cells in your uterus.  You were recently pregnant.  You have gone through menopause.  You have had major surgery on the uterus, such as a cesarean delivery. LET [facility] CARE PROVIDER KNOW ABOUT:  Any allergies you have.  All medicines you are taking, including vitamins, herbs, eye drops, creams, and over-the-counter medicines.  Previous problems you or members of your family have had with the use of anesthetics.  Any blood disorders you have.  Previous surgeries you have had.  Medical conditions you have. RISKS AND COMPLICATIONS  Generally, this is a safe procedure. However, as with any procedure, complications can occur. Possible complications include:  Perforation of the uterus.  Bleeding.  Infection of the uterus, bladder, or vagina.  Injury to surrounding organs.  An air bubble to the lung (air embolus).  Pregnancy following the procedure.  Failure of the procedure to  help the problem, requiring hysterectomy.  Decreased ability to diagnose cancer in the lining of the uterus. BEFORE THE PROCEDURE  The lining of the uterus must be tested to make sure there is no pre-cancerous or cancer cells present.  An ultrasound may be performed to look at the size of the uterus and to check for abnormalities.  Medicines may be given to thin the lining of the uterus. PROCEDURE  During the procedure, your health care provider will use a tool called a resectoscope to help see inside your uterus.  There are different ways to remove the lining of your uterus.   Radiofrequency - This method uses a radiofrequency-alternating electric current to remove the lining of the uterus.  Cryotherapy - This method uses extreme cold to freeze the lining of the uterus.  Heated-Free Liquid - This method uses heated salt (saline) solution to remove the lining of the uterus.  Microwave - This method uses high-energy microwaves to heat up the lining of the uterus to remove it.  Thermal balloon - This method involves inserting a catheter with a balloon tip into the uterus. The balloon tip is filled with heated fluid to remove the lining of the uterus. AFTER THE PROCEDURE  After your procedure, do not have sexual intercourse or insert anything into your vagina until permitted by your health care provider. After the procedure, you may experience:  Cramps.  Vaginal discharge.  Frequent urination. Document Released: 09/05/2004 Document Revised: 06/29/2013 Document Reviewed: 03/30/2013 [facility] Patient Information 2015 [place], [place]. This information is not intended to replace advice given to you by your health care provider. Make sure you discuss any questions you have with your health care provider. Hysteroscopy Hysteroscopy is a procedure used for looking inside the womb (uterus). It may be done for various reasons, including:  To evaluate abnormal bleeding, fibroid (benign, noncancerous) tumors, polyps, scar tissue (adhesions), and possibly cancer of the uterus.  To look for lumps (tumors) and other uterine growths.  To look for causes of why a woman cannot get pregnant (infertility), causes of recurrent loss of pregnancy (miscarriages), or a lost intrauterine device (IUD).  To perform a sterilization by blocking the fallopian tubes from inside the uterus. In this procedure, a thin, flexible tube with a tiny light and camera on the end of it (hysteroscope) is used to look inside the uterus. A  hysteroscopy should be done right after a menstrual period to be sure you are not pregnant. LET [facility] CARE PROVIDER KNOW ABOUT:   Any allergies you have.  All medicines you are taking, including vitamins, herbs, eye drops, creams, and over-the-counter medicines.  Previous problems you or members of your family have had with the use of anesthetics.  Any blood disorders you have.  Previous surgeries you have had.  Medical conditions you have. RISKS AND COMPLICATIONS  Generally, this is a safe procedure. However, as with any procedure, complications can occur. Possible complications include:  Putting a hole in the uterus.  Excessive bleeding.  Infection.  Damage to the cervix.  Injury to other organs.  Allergic reaction to medicines.  Too much fluid used in the uterus for the procedure. BEFORE THE PROCEDURE   Ask your health care provider about changing or stopping any regular medicines.  Do not take aspirin or blood thinners for 1 week before the procedure, or as directed by your health care provider. These can cause bleeding.  If you smoke, do not smoke for 2 weeks before the procedure.  In some cases, a medicine is placed in the cervix the day before the procedure. This medicine makes the cervix have a larger opening (dilate). This makes it easier for the instrument to be inserted into the uterus during the procedure.  Do not eat or drink anything for at least 8 hours before the surgery.  Arrange for someone to take you home after the procedure. PROCEDURE   You may be given a medicine to relax you (sedative). You may also be given one of the following:  A medicine that numbs the area around the cervix (local anesthetic).  A medicine that makes you sleep through the procedure (general anesthetic).  The hysteroscope is inserted through the vagina into the uterus. The camera on the hysteroscope sends a picture to a TV screen. This gives the surgeon a good view  inside the uterus.  During the procedure, air or a liquid is put into the uterus, which allows the surgeon to see better.  Sometimes, tissue is gently scraped from inside the uterus. These tissue samples are sent to a lab for testing. AFTER THE PROCEDURE   If you had a general anesthetic, you may be groggy for a couple hours after the procedure.  If you had a local anesthetic, you will be able to go home as soon as you are stable and feel ready.  You may have some cramping. This normally lasts for a couple days.  You may have bleeding, which varies from light spotting for a few days to menstrual-like bleeding for 3-7 days. This is normal.  If your test results are not back during the visit, make an appointment with your health care provider to find out the results. Document Released: 02/02/2001 Document Revised: 08/17/2013 Document Reviewed: 05/26/2013 [facility] Patient Information 2015 [place], [place]. This information is not intended to replace advice given to you by your health care provider. Make sure you discuss any questions you have with your health care provider. PATIENT INSTRUCTIONS POST-ANESTHESIA  IMMEDIATELY FOLLOWING SURGERY:  Do not drive or operate machinery for the first twenty four hours after surgery.  Do not make any important decisions for twenty four hours after surgery or while taking narcotic pain medications or sedatives.  If you develop intractable nausea and vomiting or a severe headache please notify your doctor immediately.  FOLLOW-UP:  Please make an appointment with your surgeon as instructed. You do not need to follow up with anesthesia unless specifically instructed to do so.  WOUND CARE INSTRUCTIONS (if applicable):  Keep a dry clean dressing on the anesthesia/puncture wound site if there is drainage.  Once the wound has quit draining you may leave it open to air.  Generally you should leave the bandage intact for twenty four hours unless there is drainage.   If the epidural site drains for more than 36-48 hours please call the anesthesia department.  QUESTIONS?:  Please feel free to call your physician or the hospital operator if you have any questions, and they will be happy to assist you.

## 2014-06-01 ENCOUNTER — Other Ambulatory Visit: Payer: Self-pay

## 2014-06-01 ENCOUNTER — Encounter (HOSPITAL_COMMUNITY): Payer: Self-pay

## 2014-06-01 ENCOUNTER — Encounter (HOSPITAL_COMMUNITY)
Admission: RE | Admit: 2014-06-01 | Discharge: 2014-06-01 | Disposition: A | Discharge status: Home / Self Care | Payer: Medicare HMO | Source: Ambulatory Visit | Attending: Obstetrics and Gynecology | Admitting: Obstetrics and Gynecology

## 2014-06-01 DIAGNOSIS — Z01810 Encounter for preprocedural cardiovascular examination: Secondary | ICD-10-CM | POA: Insufficient documentation

## 2014-06-01 DIAGNOSIS — Z01812 Encounter for preprocedural laboratory examination: Secondary | ICD-10-CM | POA: Insufficient documentation

## 2014-06-01 HISTORY — DX: Anxiety disorder, unspecified: F41.9

## 2014-06-01 LAB — URINALYSIS, ROUTINE W REFLEX MICROSCOPIC
BILIRUBIN URINE: NEGATIVE
Glucose, UA: 100 mg/dL — AB
HGB URINE DIPSTICK: NEGATIVE
KETONES UR: NEGATIVE mg/dL
LEUKOCYTES UA: NEGATIVE
Nitrite: NEGATIVE
PH: 7 (ref 5.0–8.0)
Protein, ur: NEGATIVE mg/dL
Specific Gravity, Urine: <1.005 — ABNORMAL LOW (ref 1.005–1.030)
Urobilinogen, UA: 0.2 mg/dL (ref 0.0–1.0)

## 2014-06-01 LAB — COMPREHENSIVE METABOLIC PANEL
ALBUMIN: 4 g/dL (ref 3.5–5.2)
ALK PHOS: 48 U/L (ref 39–117)
ALT: 15 U/L (ref 0–35)
AST: 18 U/L (ref 0–37)
Anion gap: 12 (ref 5–15)
BILIRUBIN TOTAL: 0.5 mg/dL (ref 0.3–1.2)
BUN: 19 mg/dL (ref 6–23)
CHLORIDE: 99 meq/L (ref 96–112)
CO2: 27 meq/L (ref 19–32)
Calcium: 9.9 mg/dL (ref 8.4–10.5)
Creatinine, Ser: 1.01 mg/dL (ref 0.50–1.10)
GFR calc Af Amer: 62 mL/min — ABNORMAL LOW (ref >90)
GFR calc non Af Amer: 54 mL/min — ABNORMAL LOW (ref >90)
Glucose, Bld: 182 mg/dL — ABNORMAL HIGH (ref 70–99)
POTASSIUM: 5.3 meq/L (ref 3.7–5.3)
Sodium: 138 mEq/L (ref 137–147)
Total Protein: 6.6 g/dL (ref 6.0–8.3)

## 2014-06-01 LAB — CBC
HCT: 36.3 % (ref 36.0–46.0)
Hemoglobin: 12.5 g/dL (ref 12.0–15.0)
MCH: 30.8 pg (ref 26.0–34.0)
MCHC: 34.4 g/dL (ref 30.0–36.0)
MCV: 89.4 fL (ref 78.0–100.0)
PLATELETS: 255 10*3/uL (ref 150–400)
RBC: 4.06 MIL/uL (ref 3.87–5.11)
RDW: 13.3 % (ref 11.5–15.5)
WBC: 6.1 10*3/uL (ref 4.0–10.5)

## 2014-06-01 NOTE — Pre-Procedure Instructions (Signed)
Patient given information to sign up for my chart at home. 

## 2014-06-06 ENCOUNTER — Ambulatory Visit (HOSPITAL_COMMUNITY)
Admission: RE | Admit: 2014-06-06 | Discharge: 2014-06-06 | Disposition: A | Discharge status: Home / Self Care | Payer: Medicare HMO | Source: Ambulatory Visit | Attending: Obstetrics and Gynecology | Admitting: Obstetrics and Gynecology

## 2014-06-06 ENCOUNTER — Encounter (HOSPITAL_COMMUNITY): Payer: Self-pay | Admitting: *Deleted

## 2014-06-06 ENCOUNTER — Encounter (HOSPITAL_COMMUNITY)
Admission: RE | Disposition: A | Discharge status: Home / Self Care | Payer: Self-pay | Source: Ambulatory Visit | Attending: Obstetrics and Gynecology

## 2014-06-06 ENCOUNTER — Ambulatory Visit (HOSPITAL_COMMUNITY): Payer: Medicare HMO | Admitting: Anesthesiology

## 2014-06-06 ENCOUNTER — Encounter (HOSPITAL_COMMUNITY): Payer: Medicare HMO | Admitting: Anesthesiology

## 2014-06-06 DIAGNOSIS — C549 Malignant neoplasm of corpus uteri, unspecified: Secondary | ICD-10-CM | POA: Insufficient documentation

## 2014-06-06 DIAGNOSIS — F329 Major depressive disorder, single episode, unspecified: Secondary | ICD-10-CM | POA: Diagnosis not present

## 2014-06-06 DIAGNOSIS — M1990 Unspecified osteoarthritis, unspecified site: Secondary | ICD-10-CM | POA: Diagnosis not present

## 2014-06-06 DIAGNOSIS — E119 Type 2 diabetes mellitus without complications: Secondary | ICD-10-CM | POA: Diagnosis not present

## 2014-06-06 DIAGNOSIS — N857 Hematometra: Secondary | ICD-10-CM | POA: Insufficient documentation

## 2014-06-06 DIAGNOSIS — F3289 Other specified depressive episodes: Secondary | ICD-10-CM | POA: Insufficient documentation

## 2014-06-06 DIAGNOSIS — Z79899 Other long term (current) drug therapy: Secondary | ICD-10-CM | POA: Diagnosis not present

## 2014-06-06 DIAGNOSIS — I10 Essential (primary) hypertension: Secondary | ICD-10-CM | POA: Diagnosis not present

## 2014-06-06 DIAGNOSIS — N840 Polyp of corpus uteri: Secondary | ICD-10-CM | POA: Insufficient documentation

## 2014-06-06 DIAGNOSIS — F411 Generalized anxiety disorder: Secondary | ICD-10-CM | POA: Insufficient documentation

## 2014-06-06 DIAGNOSIS — N858 Other specified noninflammatory disorders of uterus: Secondary | ICD-10-CM

## 2014-06-06 DIAGNOSIS — Z885 Allergy status to narcotic agent status: Secondary | ICD-10-CM | POA: Diagnosis not present

## 2014-06-06 DIAGNOSIS — D509 Iron deficiency anemia, unspecified: Secondary | ICD-10-CM | POA: Diagnosis not present

## 2014-06-06 HISTORY — PX: POLYPECTOMY: SHX5525

## 2014-06-06 HISTORY — PX: HYSTEROSCOPY W/D&C: SHX1775

## 2014-06-06 LAB — GLUCOSE, CAPILLARY
Glucose-Capillary: 155 mg/dL — ABNORMAL HIGH (ref 70–99)
Glucose-Capillary: 166 mg/dL — ABNORMAL HIGH (ref 70–99)

## 2014-06-06 SURGERY — DILATATION AND CURETTAGE /HYSTEROSCOPY
Anesthesia: General | Site: Uterus

## 2014-06-06 MED ORDER — ROCURONIUM BROMIDE 50 MG/5ML IV SOLN
INTRAVENOUS | Status: AC
  Filled 2014-06-06: qty 3

## 2014-06-06 MED ORDER — PHENYLEPHRINE HCL 10 MG/ML IJ SOLN
INTRAMUSCULAR | Status: DC | PRN
  Administered 2014-06-06: 100 ug via INTRAVENOUS

## 2014-06-06 MED ORDER — LACTATED RINGERS IV SOLN
INTRAVENOUS | Status: DC | PRN
  Administered 2014-06-06 (×2): via INTRAVENOUS

## 2014-06-06 MED ORDER — ONDANSETRON HCL 4 MG/2ML IJ SOLN
4.0000 mg | Freq: Once | INTRAMUSCULAR | Status: AC
  Administered 2014-06-06: 4 mg via INTRAVENOUS

## 2014-06-06 MED ORDER — PROPOFOL 10 MG/ML IV EMUL
INTRAVENOUS | Status: AC
  Filled 2014-06-06: qty 20

## 2014-06-06 MED ORDER — ARTIFICIAL TEARS OP OINT
TOPICAL_OINTMENT | OPHTHALMIC | Status: DC | PRN
  Administered 2014-06-06: 1 via OPHTHALMIC

## 2014-06-06 MED ORDER — ONDANSETRON HCL 4 MG/2ML IJ SOLN
INTRAMUSCULAR | Status: AC
  Filled 2014-06-06: qty 2

## 2014-06-06 MED ORDER — LACTATED RINGERS IV SOLN
INTRAVENOUS | Status: DC
  Administered 2014-06-06: 09:00:00 via INTRAVENOUS

## 2014-06-06 MED ORDER — EPHEDRINE SULFATE 50 MG/ML IJ SOLN
INTRAMUSCULAR | Status: AC
  Filled 2014-06-06: qty 1

## 2014-06-06 MED ORDER — PHENYLEPHRINE HCL 10 MG/ML IJ SOLN
INTRAMUSCULAR | Status: AC
  Filled 2014-06-06: qty 1

## 2014-06-06 MED ORDER — BUPIVACAINE-EPINEPHRINE (PF) 0.5% -1:200000 IJ SOLN
INTRAMUSCULAR | Status: AC
  Filled 2014-06-06: qty 30

## 2014-06-06 MED ORDER — SODIUM CHLORIDE 0.9 % IJ SOLN
INTRAMUSCULAR | Status: AC
  Filled 2014-06-06: qty 20

## 2014-06-06 MED ORDER — CEFAZOLIN SODIUM-DEXTROSE 2-3 GM-% IV SOLR
INTRAVENOUS | Status: AC
  Filled 2014-06-06: qty 50

## 2014-06-06 MED ORDER — FENTANYL CITRATE 0.05 MG/ML IJ SOLN
INTRAMUSCULAR | Status: AC
  Filled 2014-06-06: qty 2

## 2014-06-06 MED ORDER — MIDAZOLAM HCL 2 MG/2ML IJ SOLN
1.0000 mg | INTRAMUSCULAR | Status: DC | PRN
  Administered 2014-06-06: 2 mg via INTRAVENOUS

## 2014-06-06 MED ORDER — FENTANYL CITRATE 0.05 MG/ML IJ SOLN
25.0000 ug | INTRAMUSCULAR | Status: DC | PRN
  Administered 2014-06-06: 25 ug via INTRAVENOUS
  Filled 2014-06-06: qty 2

## 2014-06-06 MED ORDER — FENTANYL CITRATE 0.05 MG/ML IJ SOLN
25.0000 ug | INTRAMUSCULAR | Status: AC
  Administered 2014-06-06 (×2): 25 ug via INTRAVENOUS

## 2014-06-06 MED ORDER — ONDANSETRON HCL 4 MG/2ML IJ SOLN: 4.0000 mg | Freq: Once | INTRAMUSCULAR | Status: DC | PRN

## 2014-06-06 MED ORDER — LIDOCAINE HCL (PF) 1 % IJ SOLN
INTRAMUSCULAR | Status: AC
  Filled 2014-06-06: qty 15

## 2014-06-06 MED ORDER — CEFAZOLIN SODIUM-DEXTROSE 2-3 GM-% IV SOLR
2.0000 g | Freq: Once | INTRAVENOUS | Status: AC
  Administered 2014-06-06: 2 g via INTRAVENOUS
  Filled 2014-06-06: qty 50

## 2014-06-06 MED ORDER — FENTANYL CITRATE 0.05 MG/ML IJ SOLN
INTRAMUSCULAR | Status: DC | PRN
  Administered 2014-06-06 (×5): 25 ug via INTRAVENOUS
  Administered 2014-06-06: 50 ug via INTRAVENOUS
  Administered 2014-06-06 (×3): 25 ug via INTRAVENOUS

## 2014-06-06 MED ORDER — SODIUM CHLORIDE 0.9 % IJ SOLN
INTRAMUSCULAR | Status: AC
  Filled 2014-06-06: qty 10

## 2014-06-06 MED ORDER — MIDAZOLAM HCL 2 MG/2ML IJ SOLN
INTRAMUSCULAR | Status: AC
  Filled 2014-06-06: qty 2

## 2014-06-06 MED ORDER — PROPOFOL 10 MG/ML IV BOLUS
INTRAVENOUS | Status: DC | PRN
  Administered 2014-06-06: 110 mg via INTRAVENOUS

## 2014-06-06 MED ORDER — LIDOCAINE HCL (CARDIAC) 20 MG/ML IV SOLN
INTRAVENOUS | Status: DC | PRN
  Administered 2014-06-06: 50 mg via INTRAVENOUS

## 2014-06-06 MED ORDER — LIDOCAINE HCL (PF) 1 % IJ SOLN
INTRAMUSCULAR | Status: AC
  Filled 2014-06-06: qty 5

## 2014-06-06 MED ORDER — SODIUM CHLORIDE 0.9 % IR SOLN
Status: DC | PRN
  Administered 2014-06-06: 3000 mL
  Administered 2014-06-06 (×2): 1000 mL

## 2014-06-06 MED ORDER — SUCCINYLCHOLINE CHLORIDE 20 MG/ML IJ SOLN
INTRAMUSCULAR | Status: AC
  Filled 2014-06-06: qty 1

## 2014-06-06 MED ORDER — FENTANYL CITRATE 0.05 MG/ML IJ SOLN
INTRAMUSCULAR | Status: AC
  Filled 2014-06-06: qty 5

## 2014-06-06 MED ORDER — CEFAZOLIN SODIUM-DEXTROSE 2-3 GM-% IV SOLR: INTRAVENOUS | Status: DC | PRN

## 2014-06-06 SURGICAL SUPPLY — 32 items
0.5% BUPIVACAINE WITH EPI W/O PRESERVATIVE 30ML IMPLANT
BAG HAMPER (MISCELLANEOUS) ×3 IMPLANT
CLOTH BEACON ORANGE TIMEOUT ST (SAFETY) ×3 IMPLANT
COVER LIGHT HANDLE STERIS (MISCELLANEOUS) ×6 IMPLANT
DECANTER SPIKE VIAL GLASS SM (MISCELLANEOUS) ×3 IMPLANT
DRAPE PROXIMA HALF (DRAPES) ×3 IMPLANT
DRAPE STERI URO 9X17 APER PCH (DRAPES) ×3 IMPLANT
ELECT REM PT RETURN 9FT ADLT (ELECTROSURGICAL) ×3
ELECTRODE REM PT RTRN 9FT ADLT (ELECTROSURGICAL) ×1 IMPLANT
FORMALIN 10 PREFIL 480ML (MISCELLANEOUS) ×2 IMPLANT
GLOVE BIOGEL PI IND STRL 7.0 (GLOVE) IMPLANT
GLOVE BIOGEL PI IND STRL 9 (GLOVE) ×1 IMPLANT
GLOVE BIOGEL PI INDICATOR 7.0 (GLOVE) ×2
GLOVE BIOGEL PI INDICATOR 9 (GLOVE) ×2
GLOVE ECLIPSE 9.0 STRL (GLOVE) ×3 IMPLANT
GLOVE EXAM NITRILE LRG STRL (GLOVE) ×2 IMPLANT
GLOVE OPTIFIT SS 6.5 STRL BRWN (GLOVE) ×2 IMPLANT
GOWN SPEC L3 XXLG W/TWL (GOWN DISPOSABLE) ×4 IMPLANT
GOWN STRL REUS W/TWL LRG LVL3 (GOWN DISPOSABLE) ×3 IMPLANT
INST SET HYSTEROSCOPY (KITS) ×3 IMPLANT
IV NS 1000ML (IV SOLUTION) ×6
IV NS 1000ML BAXH (IV SOLUTION) ×1 IMPLANT
IV NS IRRIG 3000ML ARTHROMATIC (IV SOLUTION) ×2 IMPLANT
KIT ROOM TURNOVER APOR (KITS) ×3 IMPLANT
MANIFOLD NEPTUNE II (INSTRUMENTS) ×3 IMPLANT
NS IRRIG 1000ML POUR BTL (IV SOLUTION) ×3 IMPLANT
PACK PERI GYN (CUSTOM PROCEDURE TRAY) ×3 IMPLANT
PAD ARMBOARD 7.5X6 YLW CONV (MISCELLANEOUS) ×3 IMPLANT
PAD TELFA 3X4 1S STER (GAUZE/BANDAGES/DRESSINGS) ×3 IMPLANT
SET BASIN LINEN APH (SET/KITS/TRAYS/PACK) ×3 IMPLANT
SET CYSTO W/LG BORE CLAMP LF (SET/KITS/TRAYS/PACK) ×3 IMPLANT
SYR CONTROL 10ML LL (SYRINGE) ×2 IMPLANT

## 2014-06-06 NOTE — Discharge Instructions (Signed)
Hysteroscopy, Care After °Refer to this sheet in the next few weeks. These instructions provide you with information on caring for yourself after your procedure. Your health care provider may also give you more specific instructions. Your treatment has been planned according to current medical practices, but problems sometimes occur. Call your health care provider if you have any problems or questions after your procedure.  °WHAT TO EXPECT AFTER THE PROCEDURE °After your procedure, it is typical to have the following: °· You may have some cramping. This normally lasts for a couple days. °· You may have bleeding. This can vary from light spotting for a few days to menstrual-like bleeding for 3-7 days. °HOME CARE INSTRUCTIONS °· Rest for the first 1-2 days after the procedure. °· Only take over-the-counter or prescription medicines as directed by your health care provider. Do not take aspirin. It can increase the chances of bleeding. °· Take showers instead of baths for 2 weeks or as directed by your health care provider. °· Do not drive for 24 hours or as directed. °· Do not drink alcohol while taking pain medicine. °· Do not use tampons, douche, or have sexual intercourse for 2 weeks or until your health care provider says it is okay. °· Take your temperature twice a day for 4-5 days. Write it down each time. °· Follow your health care provider's advice about diet, exercise, and lifting. °· If you develop constipation, you may: °¨ Take a mild laxative if your health care provider approves. °¨ Add bran foods to your diet. °¨ Drink enough fluids to keep your urine clear or pale yellow. °· Try to have someone with you or available to you for the first 24-48 hours, especially if you were given a general anesthetic. °· Follow up with your health care provider as directed. °SEEK MEDICAL CARE IF: °· You feel dizzy or lightheaded. °· You feel sick to your stomach (nauseous). °· You have abnormal vaginal discharge. °· You  have a rash. °· You have pain that is not controlled with medicine. °SEEK IMMEDIATE MEDICAL CARE IF: °· You have bleeding that is heavier than a normal menstrual period. °· You have a fever. °· You have increasing cramps or pain, not controlled with medicine. °· You have new belly (abdominal) pain. °· You pass out. °· You have pain in the tops of your shoulders (shoulder strap areas). °· You have shortness of breath. °Document Released: 08/17/2013 Document Reviewed: 08/17/2013 °[employer]® Patient Information ©2015 [employer]. This information is not intended to replace advice given to you by your health care provider. Make sure you discuss any questions you have with your health care provider. ° °

## 2014-06-06 NOTE — Brief Op Note (Addendum)
06/06/2014  11:09 AM  PATIENT:  Kristina Rodriguez  73 y.o. female  PRE-OPERATIVE DIAGNOSIS:  polyp endometrial, hematometra  POST-OPERATIVE DIAGNOSIS:  endometrial polyp, rule out endometrial cancer, hematometria  PROCEDURE:  Procedure(s): DILATATION AND CURETTAGE /HYSTEROSCOPY (N/A) POLYPECTOMY (N/A)  SURGEON:  Surgeon(s) and Role:    * Jonnie Kind, MD - Primary  PHYSICIAN ASSISTANT:   ASSISTANTS: None   ANESTHESIA:   general  EBL:  Total I/O In: 1100 [I.V.:1100] Out: -  all the fluid used for uterine irrigation was accounted for. There was almost 200 cc extra fluid attributable to the hematometra  BLOOD ADMINISTERED:none  DRAINS: none   LOCAL MEDICATIONS USED:  NONE  SPECIMEN:  Source of Specimen:  Endometrial curettings and polyp fragments  DISPOSITION OF SPECIMEN:  PATHOLOGY  COUNTS:  YES  TOURNIQUET:  * No tourniquets in log *  DICTATION: .Dragon Dictation  PLAN OF CARE: Discharge to home after PACU  PATIENT DISPOSITION:  PACU - hemodynamically stable.. The patient experienced some tachycardia to 119 Intra-Op which will be monitored   Delay start of Pharmacological VTE agent (>24hrs) due to surgical blood loss or risk of bleeding: not applicable

## 2014-06-06 NOTE — Op Note (Signed)
06/06/2014  11:09 AM  PATIENT:  Kristina Rodriguez  73 y.o. female  PRE-OPERATIVE DIAGNOSIS:  polyp endometrial, hematometra  POST-OPERATIVE DIAGNOSIS:  endometrial polyp, rule out endometrial cancer, hematometria  PROCEDURE:  Procedure(s): DILATATION AND CURETTAGE /HYSTEROSCOPY (N/A) POLYPECTOMY (N/A)  SURGEON:  Surgeon(s) and Role:    * Jonnie Kind, MD - Primary  PHYSICIAN ASSISTANT:   ASSISTANTS: None   ANESTHESIA:   general  EBL:  Total I/O In: 1100 [I.V.:1100] Out: -  all the fluid used for uterine irrigation was accounted for. There was almost 200 cc extra fluid attributable to the hematometra  BLOOD ADMINISTERED:none  DRAINS: none   LOCAL MEDICATIONS USED:  NONE  SPECIMEN:  Source of Specimen:  Endometrial curettings and polyp fragments  DISPOSITION OF SPECIMEN:  PATHOLOGY  COUNTS:  YES  TOURNIQUET:  * No tourniquets in log *  DICTATION: .Dragon Dictation  PLAN OF CARE: Discharge to home after PACU  PATIENT DISPOSITION:  PACU - hemodynamically stable.. The patient experienced some tachycardia to 119 Intra-Op which will be monitored   Delay start of Pharmacological VTE agent (>24hrs) due to surgical blood loss or risk of bleeding: not applicable  Patient was taken operating room, prepped and draped for vaginal procedure legs in low lithotomy support, yellowfin, and timeout conducted and procedure confirmed by surgical team as hysteroscopy D&C with removal of endometrial polyp the speculum was inserted, small Peterson speculum, the cervix grasped, single-tooth tenaculum, and efforts to dilate cervix attempted. The the uterine sound would not pass through the stenotic cervix so lachrymal duct was were used to identify the endometrial canal which then allowed drainage of the dark debris-filled fluid Ancef was administered as a precaution. Cervix was dilated to 25 [place] allowing introduction of the 30 rigid operative hysteroscope which showed enlargement vascular  mass in the fundus of the uterus. The specimen could not be biopsied at its pedicle due to ongoing bleeding from the mass itself. The stone forceps were used to grasp portions of the mass support. Uterine curettage with a smooth sharp curet obtained additional generous tissue fragments. Additional probing was then performed with Randall stone forceps and a very large amount of endometrial curettings were extracted as a specimen. Repeat hysteroscopy was attempted to inspect the uterus. Visibility was poor. Hysteroscopy was therefore discontinued. Had required rather significant amount of pressure to try to avoid the bleeding but obscuring visibility. We could see the left tubal ostia. At no time was there suspicion of uterine rupture or perforation. We discontinued any hysteroscopy when the patient developed a slightly unexpectedly high tachycardia to 119. Randall stone forceps were used repeatedly to extract tissue fragments. Measurement of the fluid utilized to irrigate the uterus, 4000 cc showed 4200 cc in the suction device indicating approximately 200 cc obtained from the hematometra. After multiple efforts removed a large amount of tissue from the endometrial cavity, procedure was discontinued and patient allowed to awaken and go recovery in stable condition and the specimen will be sent for pathology and needle counts were correct. Patient appeared stable in recovery

## 2014-06-06 NOTE — H&P (Signed)
Preoperative History and Physical  Kristina Rodriguez is a 73 y.o. No obstetric history on file. here for surgical management of postmenopausal bleeding with preop u/s showing hematometria with a 2.6 cm fundal polyp in the uterus..  Pt states that she has a lot of pain in the evening and at night. Pt here today for pre op exam.  Proposed surgery: hysteroscopy, dilation and curettage, removal of endometrial polyp  Past Medical History   Diagnosis  Date   .  Diabetes mellitus, type II    .  Hypertension    .  Hyperlipidemia    .  Osteoarthritis    .  Iron deficiency anemia      attributed to long-term treatment with a PPI   .  Anxiety and depression    .  Hepatic steatosis  2006     mild   .  Cholelithiasis  2006     Acute and chronic cholecystitis; laparoscopic cholecystectomy in 2006   .  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  Chronic pain    .  Collagen vascular disease     Past Surgical History   Procedure  Laterality  Date   .  Tubal ligation   1970s   .  Tonsillectomy     .  Carpal tunnel release   1994     Right   .  Cholecystectomy, laparoscopic   2006     Dr. Romona Curls; cholelithiasis   .  Colonoscopy   2003     Najeeb Rehman;iron deficiency anemia; normal study; hiatal hernia, gastritis, Schatzki's ring on EGD   .  Breast biopsy   10/2012     Left; fibroadenoma    OB History    Grav  Para  Term  Preterm  Abortions  TAB  SAB  Ect  Mult  Living                 Patient denies any cervical dysplasia or STIs.  (Not in a hospital admission)  Allergies   Allergen  Reactions   .  Codeine  Nausea Only   Social History: reports that she has never smoked. She has never used smokeless tobacco. She reports that she does not drink alcohol or use illicit drugs.  Family History   Problem  Relation  Age of Onset   .  Cancer  Mother    .  Diabetes  Brother    .  Congestive Heart Failure  Father    .  Hemochromatosis  Brother    Review of  Systems: Noncontributory  PHYSICAL EXAM:  Blood pressure 130/76, height 5\' 3"  (1.6 m), weight 156 lb (70.761 kg).  General appearance - alert, well appearing, and in no distress  Chest - clear to auscultation, no wheezes, rales or rhonchi, symmetric air entry  Heart - normal rate and regular rhythm  Abdomen - soft, nontender, nondistended, no masses or organomegaly  Pelvic Physical Examination: Pelvic - VULVA: normal appearing vulva with no masses, tenderness or lesions, vulvar hypopigmentation atrophic  , VAGI normal appearing vagina with normal color and discharge, no lesions, atrophic, PELVIC FLOOR EXAM: no cystocele, rectocele or prolapse noted, uterine descensus grade 1  , CERVIX: normal appearing cervix without discharge or lesions, cervical stenosis present, UTERUS: uterus is normal size, shape, consistency and nontender, tenderness Mild to bimanual, anteverted, enlarged to 4-6 week's size, mobile,  ADNEXA: normal adnexa in size, nontender and  no masses  Extremities - peripheral pulses normal, no pedal edema, no clubbing or cyanosis  Labs:  Results for orders placed in visit on 05/18/14 (from the past 336 hour(s))   CYTOLOGY - PAP    Collection Time    05/18/14 12:00 AM   Result  Value  Ref Range    CYTOLOGY - PAP  PAP RESULT    Imaging Studies:  US Transvaginal Non-ob  05/23/2014 GYNECOLOGIC SONOGRAM Kristina Rodriguez is a 73 y.o. for a pelvic sonogram for uterine mass noted on CT. Pt c/o severe pelvic pain,no vaginal bleeding noted. Uterus 10.7 x 7.9 x 6.7 cm, anteverted Endometrium Endometrial mass noted with hematometra, mass with +Doppler flow noted = 4.5cm noted in fundal region of cavity Right ovary 1.4 x 1.2 x 1.0 cm, Left ovary Unable to positively identify ovarian tissue No free fluid or adnexal masses noted within the pelvis Technician Comments: Anteverted uterus noted with fluid filled endometrial cavity with 4.5cm fundal solid mass (+Doppler flow noted within) noted within the  fundal region of the cavity, Rt ovary appears WNL, Lt adnexa appears WNL [name] 05/22/2014 11:14 AM Clinical Impression and recommendations: I have reviewed the sonogram results above. Combined with the patient's current clinical course, below are my impressions and any appropriate recommendations for management based on the sonographic findings: Large hematometrium, with a mass that is generating the production of blood or fluid. IMP endometrrium polyp, exccision indicated. Will schedule hysteroscopic rese ction. [name] V  Ct Abdomen Pelvis W Contrast  05/09/2014 CLINICAL DATA: Generalized abdominal pain EXAM: CT ABDOMEN AND PELVIS WITH CONTRAST TECHNIQUE: Multidetector CT imaging of the abdomen and pelvis was performed using the standard protocol following bolus administration of intravenous contrast. CONTRAST: 80mL OMNIPAQUE IOHEXOL 300 MG/ML SOLN COMPARISON: None. FINDINGS: The uterus is enlarged with the myometrium thinned. The endometrium or endometrial cavity is distended measuring 9.4 cm in length by 5.5 cm x 7.9 cm. There is a heterogeneous area of enhancement along the dome of the uterus which measures approximately 3.5 cm by 2.4 cm x 4.5 cm. The thin endometrium and overlying serosa is intact. No adnexal masses. No adenopathy. There is no ascites. No peritoneal or serosal implants are seen. No lung base nodules. Heart is normal in size. Liver, spleen and pancreas are unremarkable. Gallbladder surgically absent. No bile duct dilation. Two small right renal cyst. Kidneys are otherwise unremarkable. Ureters and bladder are normal. Few small colonic diverticula. The colon is otherwise unremarkable. Normal small bowel. Normal appendix. There are degenerative changes of the visualized spine mostly at L4-L5. No osteoblastic or osteolytic lesions. IMPRESSION: 1. The endometrium and/or the endometrial cavity is significantly distended, enlarging the overall size of the uterus. There is an area of  abnormal enhancement along the superior aspect of the endometrium/endometrial canal that may reflect endometrial neoplasm. There is no evidence of neoplastic disease extending beyond the confines of the uterus. 2. No evidence of metastatic disease. 3. No acute findings. 4. Few scattered colonic diverticula. No diverticulitis. 5. Status post cholecystectomy. Degenerative changes of the visualized spine. Two small right renal cysts. Electronically Signed By: Lajean Manes M.D. On: 05/09/2014 16:05  Assessment:  Patient Active Problem List    Diagnosis  Date Noted   .  Screening for malignant neoplasm of the cervix  05/18/2014   .  Uterine mass  05/18/2014   .  Other chronic pain  05/17/2014   .  Depressive disorder, not elsewhere classified  05/17/2014   .  Diabetes  mellitus, type II    .  Hypertension    .  Hyperlipidemia    .  Osteoarthritis    .  Iron deficiency anemia    Plan:  Patient will undergo surgical management with Hysteroscopy , dilation and curettage, removal of endometriial polyp    . The risks of surgery were discussed in detail with the patient including but not limited to: bleeding which may require transfusion or reoperation; infection which may require antibiotics; injury to surrounding organs which may involve bowel, bladder, ureters ; need for additional procedures including laparoscopy or laparotomy; thromboembolic phenomenon, surgical site problems and other postoperative/anesthesia complications. Likelihood of success in alleviating the patient's condition was discussed. Routine postoperative instructions will be reviewed with the patient and her family in detail after surgery. The patient concurred with the proposed plan, giving informed written consent for the surgery. Patient has been NPO since last night she will remain NPO for procedure. Anesthesia and OR aware. Preoperative prophylactic antibiotics and SCDs ordered for on call to the OR. To OR 7/28  Jonnie Kind

## 2014-06-06 NOTE — Anesthesia Procedure Notes (Signed)
Procedure Name: LMA Insertion Date/Time: 06/06/2014 9:48 AM Performed by: Andree Elk, Sheika Coutts A Pre-anesthesia Checklist: Patient identified, Timeout performed, Emergency Drugs available, Suction available and Patient being monitored Patient Re-evaluated:Patient Re-evaluated prior to inductionOxygen Delivery Method: Circle system utilized Preoxygenation: Pre-oxygenation with 100% oxygen Intubation Type: IV induction Ventilation: Mask ventilation without difficulty LMA Size: 4.0 Number of attempts: 2 Placement Confirmation: positive ETCO2 and breath sounds checked- equal and bilateral Tube secured with: Tape Dental Injury: Teeth and Oropharynx as per pre-operative assessment  Comments: Unable to place LMA 3; LMA 4 placed without difficulty; VSS throughout

## 2014-06-06 NOTE — Transfer of Care (Addendum)
Immediate Anesthesia Transfer of Care Note  Patient: Kristina Rodriguez  Procedure(s) Performed: Procedure(s): DILATATION AND CURETTAGE /HYSTEROSCOPY (N/A) POLYPECTOMY (N/A)  Patient Location: PACU  Anesthesia Type:General  Level of Consciousness: sedated and cooperative  Airway & Oxygen Therapy: Patient Spontanous Breathing and Patient connected to face mask oxygen  Post-op Assessment: Report given to PACU RN and Post -op Vital signs reviewed and stable  Post vital signs: Reviewed and stable  Complications: No apparent anesthesia complications

## 2014-06-06 NOTE — Anesthesia Postprocedure Evaluation (Signed)
  Anesthesia Post-op Note  Patient: Kristina Rodriguez  Procedure(s) Performed: Procedure(s): DILATATION AND CURETTAGE /HYSTEROSCOPY (N/A) POLYPECTOMY (N/A)  Patient Location: PACU  Anesthesia Type:General  Level of Consciousness: Sedated  Airway and Oxygen Therapy: Patient Spontanous Breathing and Patient connected to face mask oxygen  Post-op Pain: mild  Post-op Assessment: Post-op Vital signs reviewed, Patient's Cardiovascular Status Stable, Respiratory Function Stable, Patent Airway, No signs of Nausea or vomiting and Pain level controlled  Post-op Vital Signs: Reviewed and stable  Last Vitals:  Filed Vitals:   06/06/14 0935  BP: 91/66  Pulse:   Temp:   Resp: 19    Complications: No apparent anesthesia complications

## 2014-06-06 NOTE — Anesthesia Preprocedure Evaluation (Addendum)
Anesthesia Evaluation  Patient identified by MRN, date of birth, ID band Patient awake    Reviewed: Allergy & Precautions, H&P , NPO status , Patient's Chart, lab work & pertinent test results  Airway Mallampati: III TM Distance: <3 FB   Mouth opening: Limited Mouth Opening  Dental  (+) Edentulous Upper, Edentulous Lower   Pulmonary neg pulmonary ROS,  breath sounds clear to auscultation        Cardiovascular hypertension, Pt. on medications Rhythm:Regular Rate:Normal     Neuro/Psych PSYCHIATRIC DISORDERS Anxiety Depression    GI/Hepatic negative GI ROS,   Endo/Other  diabetes, Type 2, Oral Hypoglycemic Agents  Renal/GU      Musculoskeletal   Abdominal   Peds  Hematology  (+) anemia ,   Anesthesia Other Findings   Reproductive/Obstetrics                          Anesthesia Physical Anesthesia Plan  ASA: III  Anesthesia Plan: General   Post-op Pain Management:    Induction: Intravenous  Airway Management Planned: LMA  Additional Equipment:   Intra-op Plan:   Post-operative Plan: Extubation in OR  Informed Consent: I have reviewed the patients History and Physical, chart, labs and discussed the procedure including the risks, benefits and alternatives for the proposed anesthesia with the patient or authorized representative who has indicated his/her understanding and acceptance.     Plan Discussed with:   Anesthesia Plan Comments:         Anesthesia Quick Evaluation

## 2014-06-08 ENCOUNTER — Encounter (HOSPITAL_COMMUNITY): Payer: Self-pay | Admitting: Obstetrics and Gynecology

## 2014-06-09 ENCOUNTER — Telehealth: Payer: Self-pay | Admitting: Obstetrics and Gynecology

## 2014-06-09 ENCOUNTER — Telehealth: Payer: Self-pay | Admitting: *Deleted

## 2014-06-09 NOTE — Telephone Encounter (Signed)
Pt was referred by Dr. Glo Herring. Pt called to obtain New pt appointment information. Pt was advised that her appointment is scheduled for 06/12/2014 2:30  With Dr. Everitt Amber. Pt was asked to arrive at 2:00 to register. Pt agreed with appointment time and date.

## 2014-06-09 NOTE — Telephone Encounter (Signed)
Pt requesting results from procedure this past Tuesday, July, 28,2015. Pt informed Dr. Glo Herring will call and discuss results.

## 2014-06-09 NOTE — Telephone Encounter (Signed)
Pt aware of Dx: Pt referred to [facility], given [employer] Onc has her # F/U prn here.

## 2014-06-12 ENCOUNTER — Ambulatory Visit: Payer: Medicare HMO | Attending: Gynecologic Oncology | Admitting: Gynecologic Oncology

## 2014-06-12 ENCOUNTER — Encounter: Payer: Self-pay | Admitting: Gynecologic Oncology

## 2014-06-12 VITALS — BP 131/64 | HR 82 | Temp 97.9°F | Resp 20 | Ht 64.37 in | Wt 155.9 lb

## 2014-06-12 DIAGNOSIS — C549 Malignant neoplasm of corpus uteri, unspecified: Secondary | ICD-10-CM | POA: Insufficient documentation

## 2014-06-12 DIAGNOSIS — C541 Malignant neoplasm of endometrium: Secondary | ICD-10-CM

## 2014-06-12 NOTE — Progress Notes (Signed)
Consult Note: Gyn-Onc  Consult was requested by Dr. Glo Herring for the evaluation of Kristina Rodriguez 73 y.o. female  CC:  Chief Complaint  Patient presents with  . Endometrial Cancer    Assessment/Plan:  Kristina Rodriguez  is a 73 y.o.  year old who is seen in consultation at the request of Dr. 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 CT scan on 05/09/2014 for generalized abdominal pain revealed no apparent metastatic disease.   A detailed discussion was held with the patient and her family with regard to to her endometrial cancer diagnosis. We discussed the standard management options for uterine cancer which includes surgery followed possibly by adjuvant therapy depending on the results of surgery. The options for surgical management include a hysterectomy and removal of the tubes and ovaries possibly with removal of pelvic and para-aortic lymph nodes. A minimally invasive approach including a robotic hysterectomy or laparoscopic hysterectomy have benefits including shorter hospital stay, recovery time and better wound healing. The alternative approach is an open hysterectomy. The patient has been counseled about these surgical options and the risks of surgery in general including infection, bleeding, damage to surrounding structures (including bowel, bladder, ureters, nerves or vessels), and the postoperative risks of PE/ DVT, and lymphedema. I extensively reviewed the additional risks of robotic hysterectomy including possible need for conversion to open laparotomy. I discussed positioning during surgery of trendelenberg and risks of minor facial swelling and care we take in preoperative positioning. After counseling and consideration of her options, she desires to proceed with robotic hysterectomy bilateral salpingo-oophorectomy and lymph node dissection as indicated.   All of her questions were answered to her satisfaction.   She will be seen by anesthesia for  preoperative clearance and discussion of postoperative pain management. She was given the opportunity to ask questions, which were answered to her satisfaction, and she is agreement with the above mentioned plan of care. Because my earliest available operative date is in 1 month from this time I have offered her to undergo surgery at [facility] in the care of my partner Dr. Nancy Marus. The patient is agreeable to this.  HPI: Kristina Rodriguez is a 73 year old diabetic woman who began having generalized lower abdominal pain in June 2015. She denied postmenopausal bleeding. She underwent CT scan evaluation of the abdomen from 05/09/2014. CT scan of the abdomen and pelvis revealed an enlarged uterus (9.4 x 5.5 x 7.9 cm) with heterogenous area of enhancement in the dome (measuring 3.5 x 2.4 x 4.5 cm) with apparent intact overlying serosa. No adnexal masses, no adenopathy, no ascites, no perineal or serosal implants were seen. Following this she was evaluated by Dr. Glo Herring who performed endometrial curettage with pathology revealing carcinosarcoma. This sampling was performed on 06/06/2014.  She denies abdominal bloating early satiety, vaginal bleeding, lower extremity edema, change in bladder or bowel habit.  Interval History: She's had mild vaginal spotting since her D&C procedure.  Current Meds:  Outpatient Encounter Prescriptions as of 06/12/2014  Medication Sig  . ALPRAZolam (XANAX) 1 MG tablet Take 1 mg by mouth 3 (three) times daily as needed for anxiety.   Marland Kitchen atorvastatin (LIPITOR) 40 MG tablet Take 40 mg by mouth daily.   . Benzocaine-Menthol 3-3 MG STRP   . Calcium Carbonate-Vitamin D (CALCIUM + D PO)   . Diphenhydramine-APAP 12.5-325 MG TABS   . Docusate Calcium (STOOL SOFTENER PO) Take 1 tablet by mouth at bedtime. Takes 1 - 6 at HS  .  esomeprazole (NEXIUM) 40 MG capsule Take 40 mg by mouth daily before breakfast.    . fenofibrate (TRICOR) 145 MG tablet   . glipiZIDE (GLUCOTROL) 5 MG tablet  Take by mouth 2 (two) times daily before a meal.  . HYDROcodone-acetaminophen (NORCO/VICODIN) 5-325 MG per tablet Take 1 tablet by mouth every 8 (eight) hours as needed for pain.  Marland Kitchen loratadine (CLARITIN) 10 MG tablet   . losartan (COZAAR) 100 MG tablet Take 100 mg by mouth daily.  . Menthol-Methyl Salicylate (MUSCLE RUB) 10-15 % CREA   . metFORMIN (GLUCOPHAGE) 1000 MG tablet Take 1,000 mg by mouth 2 (two) times daily with a meal.  . pioglitazone (ACTOS) 15 MG tablet Take 15 mg by mouth daily.    Allergy:  Allergies  Allergen Reactions  . Codeine Nausea Only    Social Hx:   History   Social History  . Marital Status: Married    Spouse Name: N/A    Number of Children: N/A  . Years of Education: N/A   Occupational History  . Not on file.   Social History Main Topics  . Smoking status: Never Smoker   . Smokeless tobacco: Never Used  . Alcohol Use: No     Comment: Minimal use in the past; none in recent years  . Drug Use: No  . Sexual Activity: Yes    Birth Control/ Protection: Post-menopausal   Other Topics Concern  . Not on file   Social History Narrative  . No narrative on file    Past Surgical Hx:  Past Surgical History  Procedure Laterality Date  . Tonsillectomy    . Carpal tunnel release  1994    Right  . Cholecystectomy, laparoscopic  2006    Dr. Romona Curls; cholelithiasis  . Colonoscopy  2003    Najeeb Rehman;iron deficiency anemia; normal study; hiatal hernia, gastritis, Schatzki's ring on EGD  . Breast biopsy  10/2012    Left; fibroadenoma  . Cholecystectomy    . Tubal ligation  1970s    removed  . Hysteroscopy w/d&c N/A 06/06/2014    Procedure: DILATATION AND CURETTAGE /HYSTEROSCOPY;  Surgeon: Jonnie Kind, MD;  Location: [facility];  Service: Gynecology;  Laterality: N/A;  . Polypectomy N/A 06/06/2014    Procedure: POLYPECTOMY;  Surgeon: Jonnie Kind, MD;  Location: [facility];  Service: Gynecology;  Laterality: N/A;    Past Medical Hx:  Past Medical  History  Diagnosis Date  . Diabetes mellitus, type II   . Hypertension   . Hyperlipidemia   . Osteoarthritis   . Iron deficiency anemia     attributed to long-term treatment with a PPI  . Anxiety and depression   . Hepatic steatosis 2006    mild  . Cholelithiasis 2006    Acute and chronic cholecystitis; laparoscopic cholecystectomy in 2006  . 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 Chronic pain   . Collagen vascular disease   . Anxiety   . Uterine cancer 7/15    carcinosarcoma    Past Gynecological History:  Menopause in her 47's. Hx of an ectopic pregnancy treated with laparotomy and bilateral salpingectomies. G1P0010  No LMP recorded. Patient is postmenopausal.  Family Hx:  Family History  Problem Relation Age of Onset  . Cancer Mother 70    pancreas and liver  . Diabetes Brother   . Congestive Heart Failure Father   . Hemochromatosis Brother   . Cancer Maternal Aunt   .  Cancer Maternal Uncle     Review of Systems:  Constitutional  Feels well,   ENT Normal appearing ears and nares bilaterally Skin/Breast  No rash, sores, jaundice, itching, dryness Cardiovascular  No chest pain, shortness of breath, or edema  Pulmonary  No cough or wheeze.  Gastro Intestinal  No nausea, vomitting, or diarrhoea. No bright red blood per rectum, no abdominal pain, change in bowel movement, or constipation.  Genito Urinary  No frequency, urgency, dysuria, see HPI Musculo Skeletal  No myalgia, arthralgia, joint swelling or pain  Neurologic  No weakness, numbness, change in gait,  Psychology  No depression, anxiety, insomnia.   Vitals:  Blood pressure 131/64, pulse 82, temperature 97.9 F (36.6 C), temperature source Oral, resp. rate 20, height 5' 4.37" (1.635 m), weight 155 lb 14.4 oz (70.716 kg).  Physical Exam: WD in NAD Neck  Supple NROM, without any enlargements.  Lymph Node Survey No cervical supraclavicular or inguinal  adenopathy Cardiovascular  Pulse normal rate, regularity and rhythm. S1 and S2 normal.  Lungs  Clear to auscultation bilateraly, without wheezes/crackles/rhonchi. Good air movement.  Skin  No rash/lesions/breakdown  Psychiatry  Alert and oriented to person, place, and time  Abdomen  Normoactive bowel sounds, abdomen soft, non-tender and overweight without evidence of hernia. Well healed vertical midline incision. Back No CVA tenderness Genito Urinary  Vulva/vagina: Normal external female genitalia.  No lesions. No discharge or bleeding.  Bladder/urethra:  No lesions or masses, well supported bladder  Vagina: normal, atrophic  Cervix: Normal appearing, no lesions.  Uterus: 10-12 week size, mobile, no parametrial involvement or nodularity.  Adnexa: no palpable masses. Rectal  Good tone, no masses no cul de sac nodularity.  Extremities  No bilateral cyanosis, clubbing or edema.   Donaciano Eva, MD   06/12/2014, 4:47 PM

## 2014-06-12 NOTE — Patient Instructions (Signed)
Your surgery is schedule at [facility] at [facility] on Friday August 7,2015  Pre surgical testing appointment will be Tuesday June 13, 2014 at 2:30pm at [facility] at [facility].   Hysterectomy Information  A hysterectomy is a surgery in which your uterus is removed. This surgery may be done to treat various medical problems. After the surgery, you will no longer have menstrual periods. The surgery will also make you unable to become pregnant (sterile). The fallopian tubes and ovaries can be removed (bilateral salpingo-oophorectomy) during this surgery as well.  REASONS FOR A HYSTERECTOMY  Persistent, abnormal bleeding.  Lasting (chronic) pelvic pain or infection.  The lining of the uterus (endometrium) starts growing outside the uterus (endometriosis).  The endometrium starts growing in the muscle of the uterus (adenomyosis).  The uterus falls down into the vagina (pelvic organ prolapse).  Noncancerous growths in the uterus (uterine fibroids) that cause symptoms.  Precancerous cells.  Cervical cancer or uterine cancer. TYPES OF HYSTERECTOMIES  Supracervical hysterectomy--In this type, the top part of the uterus is removed, but not the cervix.  Total hysterectomy--The uterus and cervix are removed.  Radical hysterectomy--The uterus, the cervix, and the fibrous tissue that holds the uterus in place in the pelvis (parametrium) are removed. WAYS A HYSTERECTOMY CAN BE PERFORMED  Abdominal hysterectomy--A large surgical cut (incision) is made in the abdomen. The uterus is removed through this incision.  Vaginal hysterectomy--An incision is made in the vagina. The uterus is removed through this incision. There are no abdominal incisions.  Conventional laparoscopic hysterectomy--Three or four small incisions are made in the abdomen. A thin, lighted tube with a camera (laparoscope) is inserted into one of the incisions. Other tools are put through the other incisions. The uterus is cut  into small pieces. The small pieces are removed through the incisions, or they are removed through the vagina.  Laparoscopically assisted vaginal hysterectomy (LAVH)--Three or four small incisions are made in the abdomen. Part of the surgery is performed laparoscopically and part vaginally. The uterus is removed through the vagina.  Robot-assisted laparoscopic hysterectomy--A laparoscope and other tools are inserted into 3 or 4 small incisions in the abdomen. A computer-controlled device is used to give the surgeon a 3D image and to help control the surgical instruments. This allows for more precise movements of surgical instruments. The uterus is cut into small pieces and removed through the incisions or removed through the vagina. RISKS AND COMPLICATIONS  Possible complications associated with this procedure include:  Bleeding and risk of blood transfusion. Tell your health care provider if you do not want to receive any blood products.  Blood clots in the legs or lung.  Infection.  Injury to surrounding organs.  Problems or side effects related to anesthesia.  Conversion to an abdominal hysterectomy from one of the other techniques. WHAT TO EXPECT AFTER A HYSTERECTOMY  You will be given pain medicine.  You will need to have someone with you for the first 3-5 days after you go home.  You will need to follow up with your surgeon in 2-4 weeks after surgery to evaluate your progress.  You may have early menopause symptoms such as hot flashes, night sweats, and insomnia.  If you had a hysterectomy for a problem that was not cancer or not a condition that could lead to cancer, then you no longer need Pap tests. However, even if you no longer need a Pap test, a regular exam is a good idea to make sure no  other problems are starting. Document Released: 04/22/2001 Document Revised: 08/17/2013 Document Reviewed: 07/04/2013 [facility] Patient Information 2015 [place], [place]. This information is  not intended to replace advice given to you by your health care provider. Make sure you discuss any questions you have with your health care provider.

## 2014-06-13 ENCOUNTER — Encounter: Payer: Commercial Managed Care - HMO | Admitting: Adult Health

## 2014-06-16 HISTORY — PX: ABDOMINAL HYSTERECTOMY: SHX81

## 2014-06-22 ENCOUNTER — Telehealth: Payer: Self-pay | Admitting: *Deleted

## 2014-06-22 NOTE — Telephone Encounter (Signed)
Message copied by Lucile Crater on Thu Jun 22, 2014  3:55 PM ------      Message from: [place], [place]: Thu Jun 22, 2014  1:14 PM       She unfortunately has stage IIIA disease with right adnexal involvement. She knows that she will need adjuvant therapy. She is happy to see either Terrence Dupont or me to discuss treatment recommendations (sandwich with either tax/carb or ifos/tax). My preference would be tax/carb. She has some left leg weakness post surgery that is getting better. I will follow up on this with her.       Suresh Audi, please call her and schedule her for an appt.      Thank you both      Imagene Gurney ------

## 2014-06-22 NOTE — Telephone Encounter (Signed)
Called pt per MD, she advised will need to arrange transportation for appt. Gave pt appt for Aug 20 with Dr. Denman George.  Pt agreed this should be okay if she is unable to come she will call. No other concerns at this time.

## 2014-06-27 ENCOUNTER — Telehealth: Payer: Self-pay | Admitting: *Deleted

## 2014-06-27 NOTE — Telephone Encounter (Signed)
Error

## 2014-06-29 ENCOUNTER — Encounter: Payer: Self-pay | Admitting: Gynecologic Oncology

## 2014-06-29 ENCOUNTER — Ambulatory Visit: Payer: Medicare HMO | Attending: Gynecologic Oncology | Admitting: Gynecologic Oncology

## 2014-06-29 VITALS — BP 160/61 | HR 94 | Temp 98.3°F | Resp 18 | Ht 64.0 in | Wt 155.6 lb

## 2014-06-29 DIAGNOSIS — C55 Malignant neoplasm of uterus, part unspecified: Secondary | ICD-10-CM | POA: Diagnosis not present

## 2014-06-29 DIAGNOSIS — C549 Malignant neoplasm of corpus uteri, unspecified: Secondary | ICD-10-CM | POA: Insufficient documentation

## 2014-06-29 DIAGNOSIS — R29898 Other symptoms and signs involving the musculoskeletal system: Secondary | ICD-10-CM | POA: Insufficient documentation

## 2014-06-29 DIAGNOSIS — Z90710 Acquired absence of both cervix and uterus: Secondary | ICD-10-CM | POA: Insufficient documentation

## 2014-06-29 NOTE — Progress Notes (Signed)
HPI:  Kristina Rodriguez is a 73 y.o. year old No obstetric history on file. initially seen in consultation requested by Dr Glo Herring on 06/12/14 for carcinosarcoma of the endometrium.  She then underwent a robotic assisted total laparoscopic hysterectomy and BSO with pelvic and para-aortic lymphadenectomy on 06/16/14 with Dr Nancy Marus at [facility] without complications.  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  She is seen today for a postoperative check and to discuss her pathology results and treatment plan.  Since discharge from the hospital, she is feeling overall well. She denies abdominal pain. Her major complaint is some left LE weakness. She states she has difficulty crossing her legs. She states it is a little difficult walking. She denies pain or paresthesia in the leg. It is gradually and spontaneously improving over time. She has been offered but declined physical therapy intervention.  She has improving appetite, normal bowel and bladder function, and pain controlled with minimal PO medication. She has no other complaints today.    Review of systems: Constitutional:  She has no weight gain or weight loss. She has no fever or chills. Eyes: No blurred vision Ears, Nose, Mouth, Throat: No dizziness, headaches or changes in hearing. No mouth sores. Cardiovascular: No chest pain, palpitations or edema. Respiratory:  No shortness of breath, wheezing or cough Gastrointestinal: She has normal bowel movements without diarrhea or constipation. She denies any nausea or vomiting. She denies blood in her stool or heart burn. Genitourinary:  She denies pelvic pain, pelvic pressure or changes in her urinary function. She has no  hematuria, dysuria, or incontinence. She has no irregular vaginal bleeding or vaginal discharge Musculoskeletal: Denies muscle weakness or joint pains.  Skin:  She has no skin changes, rashes or itching Neurological:  Denies dizziness or headaches. No neuropathy, no numbness or tingling. Psychiatric:  She denies depression or anxiety. Hematologic/Lymphatic:   No easy bruising or bleeding   Physical Exam: Blood pressure 160/61, pulse 94, temperature 98.3 F (36.8 C), temperature source Oral, resp. rate 18, height 5\' 4"  (1.626 m), weight 155 lb 9.6 oz (70.58 kg). General: Well dressed, well nourished in no apparent distress.   HEENT:  Normocephalic and atraumatic, no lesions.  Extraocular muscles intact. Sclerae anicteric. Pupils equal, round, reactive. No mouth sores or ulcers. Thyroid is normal size, not nodular, midline. Skin:  No lesions or rashes. Breasts:  Soft, symmetric.  No skin or nipple changes.  No palpable LN or masses. Lungs:  Clear to auscultation bilaterally.  No wheezes. Cardiovascular:  Regular rate and rhythm.  No murmurs or rubs. Abdomen:  Soft, nontender, nondistended.  No palpable masses.  No hepatosplenomegaly.  No ascites. Normal bowel sounds.  No hernias.  Incisions are in tact and healing well and free of signs of infection. Genitourinary: deferred Extremities: No cyanosis, clubbing or edema.  No calf tenderness or erythema. No palpable cords. Psychiatric: Mood and affect are appropriate. Neurological: Awake, alert and oriented x 3. Sensation is intact, no neuropathy.  Musculoskeletal: No pain, normal strength and range of motion.  Assessment:    73 y.o. year old with Stage IIIA Grade 3 uterine carcinosarcoma.   S/p robotic hysterectomy, BSO, pelvic and para-aortic lymphadenectomy on 06/16/14. Pathology revealed positive microscopic ovarian involvement.   Plan: 1) Pathology  reports reviewed today 2) Treatment counseling - I discussed the high risk for recurrence  with carcinosarcoma tumors,particularly locally advanced tumors. I discussed that at our multidisciplinary treatment planning conference we discussed that we recommend combination chemotherapy and radiation therapy to reduce this risk for both local and distant recurrence. She is a candidate for either paclitaxel with ifosphamide or carboplatin chemotherapy. I am recommending paclitaxel/carboplatin q 21 days due to its better toxicity profile in elderly patients, and the comparable efficacy in phase 2 trials. Because she demonstrated locally advanced disease, we are also recommending pelvic radiation therapy with external beam radiation. We recommend sandwiching this therapy (3 cycles chemotherapy, followed by radiation, followed by an additional 3 cycles of chemotherapy). She is electing for therapy to be administered locally Linna Hoff) at [facility] and [facility]. We will facilitate these referrals. I discussed anticipated treatment course with the patient. We will see her back in approximately 1 month to evaluate her vaginal cuff for healing prior to commencing radiation.   She was given the opportunity to ask questions, which were answered to her satisfaction, and she is agreement with the above mentioned plan of care.  3)  With respect to LE weakness- her pattern is most consistent with left obturator weakness. She continues to decline PT. I discussed with the patient that she should notify us if this does not improve over time as we will facilitate referrals to PT and possibly neurology. I discussed the expected natural history of this weakness is for gradual but usually complete improvement. If this improvement plateaus she should be re-evaluated.  4) Return to clinic in 1 month for cuff evaluation.   Donaciano Eva, MD

## 2014-06-29 NOTE — Patient Instructions (Signed)
Please call if your leg does not improve or you would like to proceed with physical therapy.  We will arrange appointments with Dr. Gery Pray for Radiation in [place] and with a medical oncologist in [facility].

## 2014-07-04 ENCOUNTER — Encounter (HOSPITAL_COMMUNITY): Payer: Medicare HMO | Attending: Hematology and Oncology

## 2014-07-04 ENCOUNTER — Other Ambulatory Visit (HOSPITAL_COMMUNITY): Payer: Self-pay | Admitting: Hematology and Oncology

## 2014-07-04 ENCOUNTER — Telehealth (HOSPITAL_COMMUNITY): Payer: Self-pay

## 2014-07-04 ENCOUNTER — Telehealth: Payer: Self-pay | Admitting: *Deleted

## 2014-07-04 ENCOUNTER — Other Ambulatory Visit (HOSPITAL_COMMUNITY): Payer: Self-pay | Admitting: Oncology

## 2014-07-04 ENCOUNTER — Encounter (HOSPITAL_COMMUNITY): Payer: Self-pay

## 2014-07-04 ENCOUNTER — Encounter (HOSPITAL_BASED_OUTPATIENT_CLINIC_OR_DEPARTMENT_OTHER): Payer: Medicare HMO

## 2014-07-04 VITALS — BP 147/56 | HR 88 | Temp 98.1°F | Resp 16 | Wt 154.5 lb

## 2014-07-04 DIAGNOSIS — Z9079 Acquired absence of other genital organ(s): Secondary | ICD-10-CM | POA: Insufficient documentation

## 2014-07-04 DIAGNOSIS — D509 Iron deficiency anemia, unspecified: Secondary | ICD-10-CM

## 2014-07-04 DIAGNOSIS — K7689 Other specified diseases of liver: Secondary | ICD-10-CM | POA: Insufficient documentation

## 2014-07-04 DIAGNOSIS — G8929 Other chronic pain: Secondary | ICD-10-CM | POA: Insufficient documentation

## 2014-07-04 DIAGNOSIS — Z833 Family history of diabetes mellitus: Secondary | ICD-10-CM | POA: Diagnosis not present

## 2014-07-04 DIAGNOSIS — Z90710 Acquired absence of both cervix and uterus: Secondary | ICD-10-CM | POA: Insufficient documentation

## 2014-07-04 DIAGNOSIS — I10 Essential (primary) hypertension: Secondary | ICD-10-CM | POA: Insufficient documentation

## 2014-07-04 DIAGNOSIS — D249 Benign neoplasm of unspecified breast: Secondary | ICD-10-CM | POA: Diagnosis not present

## 2014-07-04 DIAGNOSIS — K219 Gastro-esophageal reflux disease without esophagitis: Secondary | ICD-10-CM | POA: Insufficient documentation

## 2014-07-04 DIAGNOSIS — I999 Unspecified disorder of circulatory system: Secondary | ICD-10-CM | POA: Diagnosis not present

## 2014-07-04 DIAGNOSIS — K9089 Other intestinal malabsorption: Secondary | ICD-10-CM | POA: Insufficient documentation

## 2014-07-04 DIAGNOSIS — F329 Major depressive disorder, single episode, unspecified: Secondary | ICD-10-CM | POA: Insufficient documentation

## 2014-07-04 DIAGNOSIS — Z885 Allergy status to narcotic agent status: Secondary | ICD-10-CM | POA: Insufficient documentation

## 2014-07-04 DIAGNOSIS — K8020 Calculus of gallbladder without cholecystitis without obstruction: Secondary | ICD-10-CM | POA: Insufficient documentation

## 2014-07-04 DIAGNOSIS — E785 Hyperlipidemia, unspecified: Secondary | ICD-10-CM | POA: Insufficient documentation

## 2014-07-04 DIAGNOSIS — Z809 Family history of malignant neoplasm, unspecified: Secondary | ICD-10-CM | POA: Diagnosis not present

## 2014-07-04 DIAGNOSIS — C55 Malignant neoplasm of uterus, part unspecified: Secondary | ICD-10-CM | POA: Diagnosis present

## 2014-07-04 DIAGNOSIS — Z9089 Acquired absence of other organs: Secondary | ICD-10-CM | POA: Diagnosis not present

## 2014-07-04 DIAGNOSIS — K909 Intestinal malabsorption, unspecified: Secondary | ICD-10-CM

## 2014-07-04 DIAGNOSIS — F411 Generalized anxiety disorder: Secondary | ICD-10-CM | POA: Diagnosis not present

## 2014-07-04 DIAGNOSIS — E119 Type 2 diabetes mellitus without complications: Secondary | ICD-10-CM | POA: Insufficient documentation

## 2014-07-04 DIAGNOSIS — M1990 Unspecified osteoarthritis, unspecified site: Secondary | ICD-10-CM | POA: Insufficient documentation

## 2014-07-04 DIAGNOSIS — F3289 Other specified depressive episodes: Secondary | ICD-10-CM | POA: Insufficient documentation

## 2014-07-04 DIAGNOSIS — Z79899 Other long term (current) drug therapy: Secondary | ICD-10-CM | POA: Insufficient documentation

## 2014-07-04 LAB — CBC WITH DIFFERENTIAL/PLATELET
Basophils Absolute: 0.1 10*3/uL (ref 0.0–0.1)
Basophils Relative: 1 % (ref 0–1)
EOS ABS: 0.2 10*3/uL (ref 0.0–0.7)
EOS PCT: 3 % (ref 0–5)
HEMATOCRIT: 35.7 % — AB (ref 36.0–46.0)
HEMOGLOBIN: 11.6 g/dL — AB (ref 12.0–15.0)
LYMPHS ABS: 2.1 10*3/uL (ref 0.7–4.0)
Lymphocytes Relative: 28 % (ref 12–46)
MCH: 29.1 pg (ref 26.0–34.0)
MCHC: 32.5 g/dL (ref 30.0–36.0)
MCV: 89.7 fL (ref 78.0–100.0)
MONO ABS: 0.4 10*3/uL (ref 0.1–1.0)
MONOS PCT: 6 % (ref 3–12)
Neutro Abs: 4.6 10*3/uL (ref 1.7–7.7)
Neutrophils Relative %: 62 % (ref 43–77)
Platelets: 422 10*3/uL — ABNORMAL HIGH (ref 150–400)
RBC: 3.98 MIL/uL (ref 3.87–5.11)
RDW: 13.2 % (ref 11.5–15.5)
WBC: 7.4 10*3/uL (ref 4.0–10.5)

## 2014-07-04 LAB — COMPREHENSIVE METABOLIC PANEL
ALT: 10 U/L (ref 0–35)
ANION GAP: 14 (ref 5–15)
AST: 17 U/L (ref 0–37)
Albumin: 3.8 g/dL (ref 3.5–5.2)
Alkaline Phosphatase: 51 U/L (ref 39–117)
BUN: 18 mg/dL (ref 6–23)
CALCIUM: 9.6 mg/dL (ref 8.4–10.5)
CO2: 26 mEq/L (ref 19–32)
CREATININE: 1.01 mg/dL (ref 0.50–1.10)
Chloride: 98 mEq/L (ref 96–112)
GFR calc non Af Amer: 54 mL/min — ABNORMAL LOW (ref >90)
GFR, EST AFRICAN AMERICAN: 62 mL/min — AB (ref >90)
GLUCOSE: 169 mg/dL — AB (ref 70–99)
Potassium: 4.6 mEq/L (ref 3.7–5.3)
Sodium: 138 mEq/L (ref 137–147)
TOTAL PROTEIN: 7.3 g/dL (ref 6.0–8.3)
Total Bilirubin: 0.4 mg/dL (ref 0.3–1.2)

## 2014-07-04 LAB — FERRITIN: Ferritin: 344 ng/mL — ABNORMAL HIGH (ref 10–291)

## 2014-07-04 NOTE — Telephone Encounter (Signed)
Message left for patient to call office to discuss port placement.

## 2014-07-04 NOTE — Progress Notes (Signed)
Labs drawn for cbcd,cmp,ferr

## 2014-07-04 NOTE — Telephone Encounter (Signed)
Call from [place] ,RN at [place] pt has an appt 8/28 0745 at [facility] w/ Dr. Pablo Ledger. Pt is aware. Appt with Med Onc at [facility] today at 11am

## 2014-07-04 NOTE — Progress Notes (Signed)
[place] A. Barnet Glasgow, M.D.  NEW PATIENT EVALUATION   Name: Kristina Rodriguez Date: 07/04/2014 MRN: [id number] DOB: May 07, 1941  PCP: [name], MD   REFERRING PHYSICIAN: [name], MD  REASON FOR REFERRAL: Carcinosarcoma of the uterus.      HISTORY OF PRESENT ILLNESS:Kristina Rodriguez is a 73 y.o. female who is referred by her gynecologic oncologist for adjuvant chemotherapy for stage III carcinosarcoma of the uterus, status post TAH, BSO, pelvic and para-aortic lymphadenectomy on 06/16/2014.l She has done well with no nausea, vomiting, vaginal bleeding, dysuria, hematuria, incontinence, cough, wheezing, bone pain, lower extremity swelling or redness, PND, orthopnea, palpitations, headache, or seizures.   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   Oncologic history: Robotic assisted total laparoscopic hysterectomy and BSO with pelvic and para-aortic lymphadenectomy on 06/16/14 with Dr Nancy Marus at [facility] without complications. 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 Her final pathologic diagnosis is a Stage IIIA Grade 3 carcinosarcoma of the uterus with Microscopic ovarian involvement, negative cervical stroma and negative lymph nodes.      PAST SURGICAL HISTORY: Past Surgical History  Procedure Laterality Date  . Tonsillectomy    . Carpal tunnel release  1994    Right  . Cholecystectomy, laparoscopic  2006    Dr. Romona Curls; cholelithiasis  . Colonoscopy  2003    Najeeb Rehman;iron deficiency anemia; normal study; hiatal hernia, gastritis,  Schatzki's ring on EGD  . Breast biopsy  10/2012    Left; fibroadenoma  . Cholecystectomy    . Tubal ligation  1970s    removed  . Hysteroscopy w/d&c N/A 06/06/2014    Procedure: DILATATION AND CURETTAGE /HYSTEROSCOPY;  Surgeon: Jonnie Kind, MD;  Location: [facility];  Service: Gynecology;  Laterality: N/A;  . Polypectomy N/A 06/06/2014    Procedure: POLYPECTOMY;  Surgeon: Jonnie Kind, MD;  Location: [facility];  Service: Gynecology;  Laterality: N/A;  . Abdominal hysterectomy  06/16/14    robotic hysterectomy, BSO, pelvic and para-aortic lymphadenectomy for uterine carcinosarcoma     CURRENT MEDICATIONS: has a current medication list which includes the following prescription(s): alprazolam, atorvastatin, diphenhydramine-apap, docusate calcium, esomeprazole, fenofibrate, glipizide, ibuprofen, losartan, muscle rub, metformin, oxycodone-acetaminophen, pioglitazone, polyethylene glycol, benzocaine-menthol, calcium carbonate-vitamin d, hydrocodone-acetaminophen, and loratadine.   ALLERGIES: Codeine   SOCIAL HISTORY:  reports that she has never smoked. She has never used smokeless tobacco. She reports that she does not drink alcohol or use illicit drugs.   FAMILY HISTORY: family history includes Cancer in her maternal aunt and maternal uncle; Cancer (age of onset: 4) in her mother; Congestive Heart Failure in her father; Diabetes in her brother; Hemochromatosis in her brother.    REVIEW OF SYSTEMS:  Other than that discussed above is noncontributory.    PHYSICAL EXAM:  weight is 154 lb 8 oz (70.081 kg). Her oral temperature is 98.1 F (36.7 C). Her blood pressure is 147/56 and her pulse is 88. Her respiration is 16.    GENERAL:alert, no distress and comfortable SKIN: skin color, texture, turgor are normal, no rashes or significant lesions EYES: normal, Conjunctiva are pink and non-injected, sclera clear OROPHARYNX:no exudate, no erythema and lips, buccal mucosa,  and tongue normal    NECK: supple, thyroid normal size, non-tender, without nodularity CHEST: Normal AP diameter with no breast masses. LYMPH:  no palpable lymphadenopathy in the cervical, axillary or inguinal LUNGS: clear to auscultation and percussion with normal breathing effort HEART: regular rate & rhythm and no murmurs ABDOMEN:abdomen soft, non-tender and normal bowel sounds. Lower midline surgical scar well healed. Laparoscopic port sites well healed with no, masses, hepatomegaly, ascites, or CVA tenderness. MUSCULOSKELETALl:no cyanosis of digits, no clubbing or edema  NEURO: alert & oriented x 3 with fluent speech, no focal motor/sensory deficits    LABORATORY DATA:  Lab on 07/04/2014  Component Date Value Ref Range Status  . WBC 07/04/2014 7.4  4.0 - 10.5 K/uL Final  . RBC 07/04/2014 3.98  3.87 - 5.11 MIL/uL Final  . Hemoglobin 07/04/2014 11.6* 12.0 - 15.0 g/dL Final  . HCT 07/04/2014 35.7* 36.0 - 46.0 % Final  . MCV 07/04/2014 89.7  78.0 - 100.0 fL Final  . MCH 07/04/2014 29.1  26.0 - 34.0 pg Final  . MCHC 07/04/2014 32.5  30.0 - 36.0 g/dL Final  . RDW 07/04/2014 13.2  11.5 - 15.5 % Final  . Platelets 07/04/2014 422* 150 - 400 K/uL Final  . Neutrophils Relative % 07/04/2014 62  43 - 77 % Final  . Neutro Abs 07/04/2014 4.6  1.7 - 7.7 K/uL Final  . Lymphocytes Relative 07/04/2014 28  12 - 46 % Final  . Lymphs Abs 07/04/2014 2.1  0.7 - 4.0 K/uL Final  . Monocytes Relative 07/04/2014 6  3 - 12 % Final  . Monocytes Absolute 07/04/2014 0.4  0.1 - 1.0 K/uL Final  . Eosinophils Relative 07/04/2014 3  0 - 5 % Final  . Eosinophils Absolute 07/04/2014 0.2  0.0 - 0.7 K/uL Final  . Basophils Relative 07/04/2014 1  0 - 1 % Final  . Basophils Absolute 07/04/2014 0.1  0.0 - 0.1 K/uL Final  . Sodium 07/04/2014 138  137 - 147 mEq/L Final  . Potassium 07/04/2014 4.6  3.7 - 5.3 mEq/L Final  . Chloride 07/04/2014 98  96 - 112 mEq/L Final  . CO2 07/04/2014 26  19 - 32 mEq/L Final  . Glucose, Bld 07/04/2014 169*  70 - 99 mg/dL Final  . BUN 07/04/2014 18  6 - 23 mg/dL Final  . Creatinine, Ser 07/04/2014 1.01  0.50 - 1.10 mg/dL Final  . Calcium 07/04/2014 9.6  8.4 - 10.5 mg/dL Final  . Total Protein 07/04/2014 7.3  6.0 - 8.3 g/dL Final  . Albumin 07/04/2014 3.8  3.5 - 5.2 g/dL Final  . AST 07/04/2014 17  0 - 37 U/L Final  . ALT 07/04/2014 10  0 - 35 U/L Final  . Alkaline Phosphatase 07/04/2014 51  39 - 117 U/L Final  . Total Bilirubin 07/04/2014 0.4  0.3 - 1.2 mg/dL Final  . GFR calc non Af Amer 07/04/2014 54* >90 mL/min Final  . GFR calc Af Amer 07/04/2014 62* >90 mL/min Final   Comment: (NOTE)                          The eGFR has been calculated using the CKD EPI equation.                          This calculation has not been validated in all clinical situations.  eGFR's persistently <90 mL/min signify possible Chronic Kidney                          Disease.  . Anion gap 07/04/2014 14  5 - 15 Final  Admission on 06/06/2014, Discharged on 06/06/2014  Component Date Value Ref Range Status  . Glucose-Capillary 06/06/2014 155* 70 - 99 mg/dL Final  . Glucose-Capillary 06/06/2014 166* 70 - 99 mg/dL Final    Urinalysis    Component Value Date/Time   COLORURINE YELLOW 06/01/2014 1130   APPEARANCEUR CLEAR 06/01/2014 1130   LABSPEC <1.005* 06/01/2014 1130   PHURINE 7.0 06/01/2014 1130   GLUCOSEU 100* 06/01/2014 1130   HGBUR NEGATIVE 06/01/2014 1130   BILIRUBINUR NEGATIVE 06/01/2014 1130   KETONESUR NEGATIVE 06/01/2014 1130   PROTEINUR NEGATIVE 06/01/2014 1130   UROBILINOGEN 0.2 06/01/2014 1130   NITRITE NEGATIVE 06/01/2014 1130   LEUKOCYTESUR NEGATIVE 06/01/2014 1130      '@RADIOGRAPHY'$ : CT Abdomen Pelvis W Contrast Status: Final result         PACS Images    Show images for CT Abdomen Pelvis W Contrast         Study Result    CLINICAL DATA: Generalized abdominal pain  EXAM:  CT ABDOMEN AND PELVIS WITH CONTRAST  TECHNIQUE:  Multidetector CT imaging of the  abdomen and pelvis was performed  using the standard protocol following bolus administration of  intravenous contrast.  CONTRAST: 40mL OMNIPAQUE IOHEXOL 300 MG/ML SOLN  COMPARISON: None.  FINDINGS:  The uterus is enlarged with the myometrium thinned. The endometrium  or endometrial cavity is distended measuring 9.4 cm in length by 5.5  cm x 7.9 cm. There is a heterogeneous area of enhancement along the  dome of the uterus which measures approximately 3.5 cm by 2.4 cm x  4.5 cm. The thin endometrium and overlying serosa is intact.  No adnexal masses. No adenopathy. There is no ascites. No peritoneal  or serosal implants are seen.  No lung base nodules. Heart is normal in size.  Liver, spleen and pancreas are unremarkable. Gallbladder surgically  absent. No bile duct dilation. Two small right renal cyst. Kidneys  are otherwise unremarkable. Ureters and bladder are normal.  Few small colonic diverticula. The colon is otherwise unremarkable.  Normal small bowel. Normal appendix.  There are degenerative changes of the visualized spine mostly at  L4-L5. No osteoblastic or osteolytic lesions.  IMPRESSION:  1. The endometrium and/or the endometrial cavity is significantly  distended, enlarging the overall size of the uterus. There is an  area of abnormal enhancement along the superior aspect of the  endometrium/endometrial canal that may reflect endometrial neoplasm.  There is no evidence of neoplastic disease extending beyond the  confines of the uterus.  2. No evidence of metastatic disease.  3. No acute findings.  4. Few scattered colonic diverticula. No diverticulitis.  5. Status post cholecystectomy. Degenerative changes of the  visualized spine. Two small right renal cysts.  Electronically Signed  By: Lajean Manes M.D.  On: 05/09/2014      PATHOLOGY:  for MARAL, LAMPE ([id number]) Patient: HAVAH, AMMON Collected: 06/06/2014 Client: [facility] Accession:  [id number] Received: 06/06/2014 Mallory Shirk DOB: 01/16/1941 Age: 69 Gender: F Reported: 06/07/2014 [address] Patient Ph: [phone] MRN #: [id number] Linna Hoff [place] [address] Visit #: [id number] Chart #: Phone: [phone] Fax: CC: REPORT OF SURGICAL PATHOLOGY FINAL DIAGNOSIS Diagnosis Endometrium, curettage, with polyp fragments - CARCINOSARCOMA. Microscopic Comment The  sections show high grade carcinoma component and sarcomatous component. The carcinoma component is predominantly composed of serous carcinoma with minor components of clear cell carcinoma and high grade endometrioid carcinoma. The sarcomatous component display markedly atypical spindle cell proliferation with significant nuclear pleomorphism, brisk mitotic activity and tumor necrosis. Angiolymphatic invasion is also present. 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 The case was discussed with Dr. Mallory Shirk on 06/07/2014. Gretel Acre LI MD Pathologist, Electronic Signature (Case signed 06/07/2014) Specimen Gross and Clinical Information    Report  Surgical pathology exam8/05/2014  [place]  Specimen  Tissue   Result Narrative  Patient:     AMEENAH, PROSSER MRN:         [id number] DOB (Age):   1941-05-28 (Age: 75) Collected:   06/16/2014 Received:    06/16/2014 Completed:   06/21/2014  Surgical Pathology Report   * Amended * Addenda Present  Accession #:   [id number]  Amendments: Amended:  06/21/2014 by Barrie Lyme Reason: Typographical Error Comment: Stage Previous Signout Date:  06/19/2014  Diagnosis: A:  Uterus, cervix, right fallopian tube and ovary, total hysterectomy and right salpingo-oophorectomy  Histologic type:     carcinosarcoma (serous carcinomatous component and homologous sarcomatous component)            Histologic grade:     FIGO grade 3   Tumor site:     anterior and posterior endometrium involving anterior  and posterior lower uterine segments   Tumor size (gross):     4.5 cm        Myometrial invasion: Inner half Depth:     2 mm           Wall thickness:     1.4 cm           Percent:     14% (A4)       Serosal involvement:     not identified             Lower uterine segment involvement:     present; non-invasive serous carcinoma involves anterior and posterior lower uterine segments             Cervical involvement:     not identified        Adnexal involvement:     present, involving periadnexal soft tissue (see comment)         Other involved sites:     not applicable        Cervical/vaginal margin and distance:     widely negative             Lymphovascular space invasion:     not identified        Regional lymph nodes (see other specimens):      Total number involved: 0                Total number examined: 17            Additional pathologic findings: 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; no malignancy identified  Right fallopian tube  not definitively identified; predominantly necrotic tumor nodule in periadnexal soft tissue (see comment) Cervix  areas of erosion  Endometrium  background inactive with erosion  Myometrium  small intramural leiomyomata  Serosal adhesions with a small calcified nodule Vascular medial calcifications        AJCC Pathologic Stage: pT3a pN0 pMx FIGO (2009 classification) Stage Grouping: IIIA Note:  This pathologic stage assessment is based  on information available at the time of this report, and is subject to change pending clinical review and additional information.    B:  Lymph nodes, right pelvic, regional resection - Six lymph nodes, negative for malignancy (0/6)   C:  Lymph nodes, left pelvic, regional resection - Ten lymph nodes, negative for malignancy (0/10) - One lymph node with endosalpingiosis (see comment)   D:  Lymph nodes, right periaortic, biopsy - One lymph node, negative for malignancy  (0/1) - All tissue submitted for light microscopic examination   Comment: The tumor is a biphasic malignant neoplasm with a serous carcinomatous component and an undifferentiated/homologous sarcomatous component.  The right fallopian tube is not grossly identified; therefore, the entire adnexal soft tissue is submitted for light microscopic examination.  This shows a small microscopic tumor nodule of almost entirely necrotic carcinosarcoma in the periadnexal soft tissue.  Definite fallopian tube tissue is not identified.  This finding is interpreted as pelvic extension of the tumor with involvement of  the adnexal soft tissue/fallopian tube.  The differential diagnosis includes replacement of fallopian tube tissue by tumor vs. tumor implant in periadnexal soft tissue.  Clinical correlation with operative procedure performed to determine if definitive fallopian tube tissue is removed is required.   In specimen C, one lymph node shows a bland appearing epithelial inclusion in the capsule.  No high grade nuclei are identified. The finding is consistent with endosalpingiosis involving a lymph node and no diagnostic metastatic tumor is identified. Immunostains (P53, P16, and Ki-67) are pending on block C3 for further evaluation and will be issued in an addendum.      Clinical History: Uterine carcinosarcoma.  Gross Description: Received are four formalin-filled containers.  Container A is additionally labeled "uterus, cervix, right tube and ovary".  Adnexa:          Attached ovary and right fallopian tube is absent Weight:          80 grams      Shape:          Pyriform  Dimensions:           height:               8.0 cm      anterior to posterior width:     3.5 cm      breadth at fundus:          5.5 cm      Serosa:                    Erythematous with focal dusky purple membranous adhesions as well as a 1.5 x 1.0 x 1.0 cm fibrofatty adhesion at the fundus.  Within the  fibrofatty adhesion is a 0.7 x 0.7 x 0.7 cm calcified nodule. Cervix:           ectocervix:               Pink and smooth            endocervix:               Tan and trabeculated and erythematous      length of endocervical canal:     2.6 cm            Endomyometrium:           length of endometrial cavity:          4.5 cm       width of endometrial cavity  at fundus:     3.6 cm      tumor findings:                                         dimensions:          4.5 x 4.0 x 0.3 cm           appearance:          Soft friable red/brown ill-defined and irregular                location and extent:          Involves approximately 75% of the endometrium and is 0.3 cm from the lower uterine segment/endocervical canal junction.      myometrial invasion:               Inner one-half                thickness of myometrial wall at deepest gross invasion:     1.0 cm      other findings or comments:     The myometrium is tan, slightly trabeculated. Adnexa:           Right ovary:                dimensions:     3.1 x 1.5 x 0.7 cm           external surface:     Unremarkable                 cut surface:          Unremarkable       Right fallopian tube:                dimensions:     Grossly absent                other findings:            Lymph nodes:          Received in separate containers  Other comments:          Anterior cervix is inked green for orientation purposes.  Digital photograph taken:       No  Tissue submitted for special investigations:     Tumor given to the tumor procurement foundation.   Block Summary: A1     - Full length longitudinal section of anterior cervix with anterior lower uterine segment, one piece bisected  A2     - Full length longitudinal section of posterior cervix and posterior lower uterine segment, one piece bisected A3     - Anterior upper corpus, full thickness A4     - Anterior corpus, full thickness A5     - Posterior upper corpus, full  thickness A6     - Posterior corpus, full thickness A7     - Right ovary A8     - Serosal adhesion with calcified nodule A9-A13     - All of right mesovarium soft tissue for potential FT candidate tissue  Container B is additionally labeled "right pelvic nodes" and consists of a 4.7 x 2.5 x 1.5 cm portion of fatty tissue that contains three lymph nodes, ranging from 0.3 cm in greatest dimension up to 2.6 cm in greatest dimension.    Block Summary: B1     - One lymph node  B2     - One lymph node, serially sectioned B3     - One lymph node, serially sectioned B4-B5     - Remainder of adipose tissue for potential lymph node candidates, NTR  Container C is additionally labeled "left pelvic nodes" and consists of a 5.7 x 2.5 x 1.5 cm aggregate of adipose tissue that contains three fatty lymph node candidates up to 3.2 cm in greatest dimension.    Block Summary: C1     - One lymph node candidate, serially sectioned C2-C3     - One lymph node candidate, serially sectioned C4-C5     - One lymph node candidate, serially sectioned C6-C8     - Remainder of adipose tissue for potential lymph node candidates, NTR  Container D is additionally labeled "right periaortic" and consists of a 2.1 x 1.0 x 0.9 cm pink/tan lymph node with surrounding adipose tissue, serially sectioned and submitted in D1, NTR.  Vertell Limber)   P.A.: Janine Ores, PA (ASCP) lmf//06/16/2014  [place] Microscopy: Light microscopic examination performed by Dr. Ruthann Cancer.   Signature Attending Pathologist: Glenice Laine, M.D., PH.D. abk(06/19/2014) I have personally conducted the evaluation of the above specimens and have rendered the above diagnosis(es).  Electronically Signed by: Glenice Laine, M.D., Ph.D. Date 06/21/2014  Procedures/Addenda: Addendum   Addendum For immunostains.   Addendum Comment Immunostains are performed on the lymph node with endosalpingiosis (C3).  P16, P53 and Ki-67 are negative in  this focus.  The morphology and immunostaining supports endosalpingiosis and no diagnostic metastatic carcinoma is identified.  The previous diagnoses are unchanged.     Electronically Signed:   Glenice Laine, M.D., Ph.D.      Date:  06/20/2014  Billing Fee Code(s)/CPT Code(s): A; 38882 B; 80034 C; 91791, P16, -Add, KI67- Add D; 50569     IMPRESSION:  #1 stage III carcinosarcoma of the uterus (pT3 N0 MX), status post TAH, BSO, pelvic and para-aortic lymphadenectomy by robotic-assisted laparoscopic technique. #2. Iron deficiency anemia secondary to malabsorption, stable. #3. Gastroesophageal reflux disease on long-term proton pump inhibitor therapy. #4. Diabetes mellitus, type II, non-insulin requiring, controlled. #5. Hypertension, controlled. #6. Hyperlipidemia, on treatment.   PLAN:  #1. Referral back to Dr. Alycia Rossetti for life port insertion. #2. Chemotherapy with carboplatin/Taxol for 2 cycles with Neulasta support followed by sandwich radiotherapy followed by 2 additional cycles of chemotherapy with radiation to be delivered at [facility]. #3. Chemotherapy teaching by nurse navigator. Information about the chemotherapy drugs plus Neulasta was given to the patient today. #4. Tentative starting treatment on 07/16/2014. Radiotherapy appointment scheduled for 07/07/2014 at [facility].  I appreciate the opportunity of sharing in her care.   Doroteo Bradford, MD 07/04/2014 3:59 PM   DISCLAIMER:  This note was dictated with voice recognition softwre.  Similar sounding words can inadvertently be transcribed inaccurately and may not be corrected upon review.

## 2014-07-04 NOTE — Patient Instructions (Signed)
[place] Discharge Instructions  RECOMMENDATIONS MADE BY THE CONSULTANT AND ANY TEST RESULTS WILL BE SENT TO YOUR REFERRING PHYSICIAN.  EXAM FINDINGS BY THE PHYSICIAN TODAY AND SIGNS OR SYMPTOMS TO REPORT TO CLINIC OR PRIMARY PHYSICIAN: Exam and findings as discussed by Dr. Barnet Glasgow.  Will need chemotherapy.  Will make a referral for port a catheter placement and plan to start treatment in early September.  Plans to treat you with Carboplatin and Taxol every 3 weeks for 2 cycles, radiation therapy then 2 more cycles of chemotherapy.  You have an appointment with radiation at [facility] in [place] on 07/07/14.  MEDICATIONS PRESCRIBED:  none  INSTRUCTIONS/FOLLOW-UP: To be arranged.  Thank you for choosing [facility] to provide your oncology and hematology care.  To afford each patient quality time with our providers, please arrive at least 15 minutes before your scheduled appointment time.  With your help, our goal is to use those 15 minutes to complete the necessary work-up to ensure our physicians have the information they need to help with your evaluation and healthcare recommendations.    Effective January 1st, 2014, we ask that you re-schedule your appointment with our physicians should you arrive 10 or more minutes late for your appointment.  We strive to give you quality time with our providers, and arriving late affects you and other patients whose appointments are after yours.    Again, thank you for choosing [facility].  Our hope is that these requests will decrease the amount of time that you wait before being seen by our physicians.       _____________________________________________________________  Should you have questions after your visit to [facility], please contact our office at [phone] between the hours of 8:30 a.m. and 4:30 p.m.  Voicemails left after 4:30 p.m. will not be returned until the following  business day.  For prescription refill requests, have your pharmacy contact our office with your prescription refill request.    _______________________________________________________________  We hope that we have given you very good care.  You may receive a patient satisfaction survey in the mail, please complete it and return it as soon as possible.  We value your feedback!  _______________________________________________________________  Have you asked about our STAR program?  STAR stands for Survivorship Training and Rehabilitation, and this is a nationally recognized cancer care program that focuses on survivorship and rehabilitation.  Cancer and cancer treatments may cause problems, such as, pain, making you feel tired and keeping you from doing the things that you need or want to do. Cancer rehabilitation can help. Our goal is to reduce these troubling effects and help you have the best quality of life possible.  You may receive a survey from a nurse that asks questions about your current state of health.  Based on the survey results, all eligible patients will be referred to the [facility] program for an evaluation so we can better serve you!  A frequently asked questions sheet is available upon request.

## 2014-07-05 ENCOUNTER — Other Ambulatory Visit (HOSPITAL_COMMUNITY): Payer: Self-pay | Admitting: Oncology

## 2014-07-05 MED ORDER — LIDOCAINE-PRILOCAINE 2.5-2.5 % EX CREA: TOPICAL_CREAM | CUTANEOUS | Status: DC

## 2014-07-05 MED ORDER — METOCLOPRAMIDE HCL 5 MG PO TABS: ORAL_TABLET | ORAL | Status: DC

## 2014-07-05 MED ORDER — PROCHLORPERAZINE MALEATE 10 MG PO TABS: ORAL_TABLET | ORAL | Status: DC

## 2014-07-05 NOTE — Patient Instructions (Addendum)
[facility]   CHEMOTHERAPY INSTRUCTIONS   Premeds: Prior to receiving chemotherapy you will receive 5 drugs: Benadryl IV(histamine blocker -to reduce your risk of having an allergic reaction to the Taxol), Dexamethasone IV (steroid-given also to reduce your risk of having an allergic reaction to the Taxol), Pepcid IV(histamine blocker - given to reduce your risk of having an allergic reaction to the Taxol), Emend (anti-nausea medication), Zofran (anti-nausea medication).  Carboplatin - this medication can potentially be hard on your kidneys - this is why we need you to drink 64 oz of fluid (preferably water/decaff fluids) daily. Drink more if you can. This will help to keep your kidneys flushed. This can cause mild hair loss, lower your platelets (which make your blood cells clot), lower your white blood cells (fight infection), and cause nausea/vomiting. (this medication will take 30 60 minutes to infuse)  Taxol - the first time you receive this drug we will titrate it very slowly to ensure that you do not have or are not having an allergic reaction to the chemo.   Side Effects: hair loss, lowers your white blood cells (fight infection), muscle aches, nausea/vomiting, irritation to the mouth (mouth sores, pain in your mouth) *neuropathy - numbness/tingling/burning in hands/fingers/feet/toes. We need to know as soon as this begins to happen so that we can monitor it and treat if necessary. The numbness generally begins in the fingertips of tips of toes and then begins to travel up the finger/toe/hand/foot. We never want you getting to where you can't pick up a pen, coin, zip a zipper, button a button, or have trouble walking. You must tell us immediately if you are experiencing peripheral neuropathy!    (The first time you receive this medication it will take approximately 4-5 hours to run in). (Subsequent infusions will take approximately 3 hours).  Neulasta - this  medication is not chemo but being given because you have had chemo. It is usually given 20-24 hours after the completion of chemotherapy. This medication works by boosting your bone marrow's supply of white blood cells. White blood cells are what protect our bodies against infection. The medication is given in the form of a subcutaneous injection. It is given in the fatty tissue of your abdomen. It is a short needle. The major side effect of this medication is bone or muscle pain. The drug of choice to relieve or lessen the pain is Aleve or Ibuprofen. If a physician has ever told you not to take Aleve or Ibuprofen - then don't take it. You should then take Tylenol/acetaminophen. Take either medication as the bottle directs you to.  The level of pain you experience as a result of this injection can range from none, to mild or moderate, or severe. Please let us know if you develop moderate or severe bone pain.   (this shot is given the day after chemotherapy - this visit will take approximately 10 minutes)   POTENTIAL SIDE EFFECTS OF TREATMENT: Increased Susceptibility to Infection, Vomiting, Constipation, Hair Thinning, Changes in Character of Skin and Nails (brittleness, dryness,etc.), Bone Marrow Suppression, Abdominal Cramping, Complete Hair Loss, Nausea, Diarrhea, Sun Sensitivity and Mouth Sores   SELF IMAGE NEEDS AND REFERRALS MADE: Obtain hair accessories as soon as possible (wigs, scarves, turbans,caps,etc.) Referral to Look Good, Feel Better consultant   EDUCATIONAL MATERIALS GIVEN AND REVIEWED: Chemotherapy and You booklet Specific Instructions Sheets: Carboplatin, Taxol, Dexamethasone, Emend, Zofran, Pepcid, Benadryl, Neulasta, Reglan/Metoclopramide, Compazine/Prochlorperazine, EMLA cream    SELF  CARE ACTIVITIES WHILE ON CHEMOTHERAPY: Increase your fluid intake 48 hours prior to treatment and drink at least 2 quarts per day after treatment., No alcohol intake., No aspirin or other  medications unless approved by your oncologist., Eat foods that are light and easy to digest., Eat foods at cold or room temperature., No fried, fatty, or spicy foods immediately before or after treatment., Have teeth cleaned professionally before starting treatment. Keep dentures and partial plates clean., Use soft toothbrush and do not use mouthwashes that contain alcohol. Biotene is a good mouthwash that is available at most pharmacies. Use warm salt water gargles (1 teaspoon salt per 1 quart warm water) before and after meals and at bedtime. Or you may rinse with 2 tablespoons of three -percent hydrogen peroxide mixed in eight ounces of water., Always use sunscreen with SPF (Sun Protection Factor) of 30 or higher. and Use your nausea medication as directed to prevent nausea.   MEDICATIONS: You have been given prescriptions for the following medications:  Reglan/Metoclopramide 5mg  tablet. Starting the day after chemo, take 1 tablet four times a day x 2 days. Then may take 1 tablet four times a day IF needed for nausea/vomiting.   Compazine/Prochlorperazine 10mg  tablet. Starting the day after chemo, take 1 tablet four times a day x 2 days. Then may take 1 tablet four times a day IF needed for nausea/vomiting.   EMLA cream. Apply a quarter size amount to port site 1 hour prior to chemo. Do not rub in. Cover with plastic wrap.    Over-the-Counter Meds:  Colace - this is a stool softener. Take 100mg  capsule 2-6 times a day as needed. If you have to take more than 6 capsules of Colace a day call the [facility].  Senna - this is a mild laxative used to treat mild constipation. May take 2 tabs by mouth daily or up to twice a day as needed for mild constipation.   Milk of Magnesia - this is a laxative used to treat moderate to severe constipation. May take 2-4 tablespoons every 8 hours as needed. May increase to 8 tablespoons x 1 dose and if no bowel movement call the [facility].  Imodium -  this is for diarrhea. Take 2 tabs after 1st loose stool and then 1 tab after each loose stool until you go a total of 12 hours without a loose stool. Call [facility] if loose stools continue.  Please wash your hands for at least 30 seconds using warm soapy water. Handwashing is the #1 way to prevent the spread of germs. Stay away from sick people or people who are getting over a cold. If you develop respiratory systems such as green/yellow mucus production or productive cough or persistent cough let us know and we will see if you need an antibiotic. It is a good idea to keep a pair of gloves on when going into grocery stores/[employer] to decrease your risk of coming into contact with germs on the carts, etc. Carry alcohol hand gel with you at all times and use it frequently if out in public. All foods need to be cooked thoroughly. No raw foods. No medium or undercooked meats, eggs. If your food is cooked medium well, it does not need to be hot pink or saturated with bloody liquid at all. Vegetables and fruits need to be washed/rinsed under the faucet with a dish detergent before being consumed. You can eat raw fruits and vegetables unless we tell you otherwise but it  would be best if you cooked them or bought frozen. Do not eat off of salad bars or hot bars unless you really trust the cleanliness of the restaurant. If you need dental work, please let us know before you go for your appointment so that we can coordinate the best possible time for you in regards to your chemo regimen. You need to also let your dentist know that you are actively taking chemo. We may need to do labs prior to your dental appointment. We also want your bowels moving at least every other day. If this is not happening, we need to know so that we can get you on a bowel regimen to help you go.    SYMPTOMS TO REPORT AS SOON AS POSSIBLE AFTER TREATMENT:  FEVER GREATER THAN 100.5 F  CHILLS WITH OR WITHOUT FEVER  NAUSEA AND VOMITING  THAT IS NOT CONTROLLED WITH YOUR NAUSEA MEDICATION  UNUSUAL SHORTNESS OF BREATH  UNUSUAL BRUISING OR BLEEDING  TENDERNESS IN MOUTH AND THROAT WITH OR WITHOUT PRESENCE OF ULCERS  URINARY PROBLEMS  BOWEL PROBLEMS  UNUSUAL RASH    Wear comfortable clothing and clothing appropriate for easy access to any Portacath or PICC line. Let us know if there is anything that we can do to make your therapy better!      I have been informed and understand all of the instructions given to me and have received a copy. I have been instructed to call the clinic [phone] or my family physician as soon as possible for continued medical care, if indicated. I do not have any more questions at this time but understand that I may call the [facility] or the Patient Navigator at [phone] during office hours should I have questions or need assistance in obtaining follow-up care.           Paclitaxel injection What is this medicine? PACLITAXEL (PAK li TAX el) is a chemotherapy drug. It targets fast dividing cells, like cancer cells, and causes these cells to die. This medicine is used to treat ovarian cancer, breast cancer, and other cancers. This medicine may be used for other purposes; ask your health care provider or pharmacist if you have questions. COMMON BRAND NAME(S): Onxol, Taxol What should I tell my health care provider before I take this medicine? They need to know if you have any of these conditions: -blood disorders -irregular heartbeat -infection (especially a virus infection such as chickenpox, cold sores, or herpes) -liver disease -previous or ongoing radiation therapy -an unusual or allergic reaction to paclitaxel, alcohol, polyoxyethylated castor oil, other chemotherapy agents, other medicines, foods, dyes, or preservatives -pregnant or trying to get pregnant -breast-feeding How should I use this medicine? This drug is given as an infusion into a vein. It is  administered in a hospital or clinic by a specially trained health care professional. Talk to your pediatrician regarding the use of this medicine in children. Special care may be needed. Overdosage: If you think you have taken too much of this medicine contact a poison control center or emergency room at once. NOTE: This medicine is only for you. Do not share this medicine with others. What if I miss a dose? It is important not to miss your dose. Call your doctor or health care professional if you are unable to keep an appointment. What may interact with this medicine? Do not take this medicine with any of the following medications: -disulfiram -metronidazole This medicine may also interact with the following  medications: -cyclosporine -diazepam -ketoconazole -medicines to increase blood counts like filgrastim, pegfilgrastim, sargramostim -other chemotherapy drugs like cisplatin, doxorubicin, epirubicin, etoposide, teniposide, vincristine -quinidine -testosterone -vaccines -verapamil Talk to your doctor or health care professional before taking any of these medicines: -acetaminophen -aspirin -ibuprofen -ketoprofen -naproxen This list may not describe all possible interactions. Give your health care provider a list of all the medicines, herbs, non-prescription drugs, or dietary supplements you use. Also tell them if you smoke, drink alcohol, or use illegal drugs. Some items may interact with your medicine. What should I watch for while using this medicine? Your condition will be monitored carefully while you are receiving this medicine. You will need important blood work done while you are taking this medicine. This drug may make you feel generally unwell. This is not uncommon, as chemotherapy can affect healthy cells as well as cancer cells. Report any side effects. Continue your course of treatment even though you feel ill unless your doctor tells you to stop. In some cases, you may  be given additional medicines to help with side effects. Follow all directions for their use. Call your doctor or health care professional for advice if you get a fever, chills or sore throat, or other symptoms of a cold or flu. Do not treat yourself. This drug decreases your body's ability to fight infections. Try to avoid being around people who are sick. This medicine may increase your risk to bruise or bleed. Call your doctor or health care professional if you notice any unusual bleeding. Be careful brushing and flossing your teeth or using a toothpick because you may get an infection or bleed more easily. If you have any dental work done, tell your dentist you are receiving this medicine. Avoid taking products that contain aspirin, acetaminophen, ibuprofen, naproxen, or ketoprofen unless instructed by your doctor. These medicines may hide a fever. Do not become pregnant while taking this medicine. Women should inform their doctor if they wish to become pregnant or think they might be pregnant. There is a potential for serious side effects to an unborn child. Talk to your health care professional or pharmacist for more information. Do not breast-feed an infant while taking this medicine. Men are advised not to father a child while receiving this medicine. What side effects may I notice from receiving this medicine? Side effects that you should report to your doctor or health care professional as soon as possible: -allergic reactions like skin rash, itching or hives, swelling of the face, lips, or tongue -low blood counts - This drug may decrease the number of white blood cells, red blood cells and platelets. You may be at increased risk for infections and bleeding. -signs of infection - fever or chills, cough, sore throat, pain or difficulty passing urine -signs of decreased platelets or bleeding - bruising, pinpoint red spots on the skin, black, tarry stools, nosebleeds -signs of decreased red  blood cells - unusually weak or tired, fainting spells, lightheadedness -breathing problems -chest pain -high or low blood pressure -mouth sores -nausea and vomiting -pain, swelling, redness or irritation at the injection site -pain, tingling, numbness in the hands or feet -slow or irregular heartbeat -swelling of the ankle, feet, hands Side effects that usually do not require medical attention (report to your doctor or health care professional if they continue or are bothersome): -bone pain -complete hair loss including hair on your head, underarms, pubic hair, eyebrows, and eyelashes -changes in the color of fingernails -diarrhea -loosening of the fingernails -loss  of appetite -muscle or joint pain -red flush to skin -sweating This list may not describe all possible side effects. Call your doctor for medical advice about side effects. You may report side effects to FDA at 1-800-FDA-1088. Where should I keep my medicine? This drug is given in a hospital or clinic and will not be stored at home. NOTE: This sheet is a summary. It may not cover all possible information. If you have questions about this medicine, talk to your doctor, pharmacist, or health care provider.  2015, Elsevier/Gold Standard. (2012-12-20 16:41:21) Carboplatin injection What is this medicine? CARBOPLATIN (KAR boe pla tin) is a chemotherapy drug. It targets fast dividing cells, like cancer cells, and causes these cells to die. This medicine is used to treat ovarian cancer and many other cancers. This medicine may be used for other purposes; ask your health care provider or pharmacist if you have questions. COMMON BRAND NAME(S): Paraplatin What should I tell my health care provider before I take this medicine? They need to know if you have any of these conditions: -blood disorders -hearing problems -kidney disease -recent or ongoing radiation therapy -an unusual or allergic reaction to carboplatin, cisplatin,  other chemotherapy, other medicines, foods, dyes, or preservatives -pregnant or trying to get pregnant -breast-feeding How should I use this medicine? This drug is usually given as an infusion into a vein. It is administered in a hospital or clinic by a specially trained health care professional. Talk to your pediatrician regarding the use of this medicine in children. Special care may be needed. Overdosage: If you think you have taken too much of this medicine contact a poison control center or emergency room at once. NOTE: This medicine is only for you. Do not share this medicine with others. What if I miss a dose? It is important not to miss a dose. Call your doctor or health care professional if you are unable to keep an appointment. What may interact with this medicine? -medicines for seizures -medicines to increase blood counts like filgrastim, pegfilgrastim, sargramostim -some antibiotics like amikacin, gentamicin, neomycin, streptomycin, tobramycin -vaccines Talk to your doctor or health care professional before taking any of these medicines: -acetaminophen -aspirin -ibuprofen -ketoprofen -naproxen This list may not describe all possible interactions. Give your health care provider a list of all the medicines, herbs, non-prescription drugs, or dietary supplements you use. Also tell them if you smoke, drink alcohol, or use illegal drugs. Some items may interact with your medicine. What should I watch for while using this medicine? Your condition will be monitored carefully while you are receiving this medicine. You will need important blood work done while you are taking this medicine. This drug may make you feel generally unwell. This is not uncommon, as chemotherapy can affect healthy cells as well as cancer cells. Report any side effects. Continue your course of treatment even though you feel ill unless your doctor tells you to stop. In some cases, you may be given additional  medicines to help with side effects. Follow all directions for their use. Call your doctor or health care professional for advice if you get a fever, chills or sore throat, or other symptoms of a cold or flu. Do not treat yourself. This drug decreases your body's ability to fight infections. Try to avoid being around people who are sick. This medicine may increase your risk to bruise or bleed. Call your doctor or health care professional if you notice any unusual bleeding. Be careful brushing and flossing your  teeth or using a toothpick because you may get an infection or bleed more easily. If you have any dental work done, tell your dentist you are receiving this medicine. Avoid taking products that contain aspirin, acetaminophen, ibuprofen, naproxen, or ketoprofen unless instructed by your doctor. These medicines may hide a fever. Do not become pregnant while taking this medicine. Women should inform their doctor if they wish to become pregnant or think they might be pregnant. There is a potential for serious side effects to an unborn child. Talk to your health care professional or pharmacist for more information. Do not breast-feed an infant while taking this medicine. What side effects may I notice from receiving this medicine? Side effects that you should report to your doctor or health care professional as soon as possible: -allergic reactions like skin rash, itching or hives, swelling of the face, lips, or tongue -signs of infection - fever or chills, cough, sore throat, pain or difficulty passing urine -signs of decreased platelets or bleeding - bruising, pinpoint red spots on the skin, black, tarry stools, nosebleeds -signs of decreased red blood cells - unusually weak or tired, fainting spells, lightheadedness -breathing problems -changes in hearing -changes in vision -chest pain -high blood pressure -low blood counts - This drug may decrease the number of white blood cells, red blood  cells and platelets. You may be at increased risk for infections and bleeding. -nausea and vomiting -pain, swelling, redness or irritation at the injection site -pain, tingling, numbness in the hands or feet -problems with balance, talking, walking -trouble passing urine or change in the amount of urine Side effects that usually do not require medical attention (report to your doctor or health care professional if they continue or are bothersome): -hair loss -loss of appetite -metallic taste in the mouth or changes in taste This list may not describe all possible side effects. Call your doctor for medical advice about side effects. You may report side effects to FDA at 1-800-FDA-1088. Where should I keep my medicine? This drug is given in a hospital or clinic and will not be stored at home. NOTE: This sheet is a summary. It may not cover all possible information. If you have questions about this medicine, talk to your doctor, pharmacist, or health care provider.  2015, [employer]vier/Gold Standard. (2008-02-01 14:38:05) Pegfilgrastim injection What is this medicine? PEGFILGRASTIM (peg fil GRA stim) is a long-acting granulocyte colony-stimulating factor that stimulates the growth of neutrophils, a type of white blood cell important in the body's fight against infection. It is used to reduce the incidence of fever and infection in patients with certain types of cancer who are receiving chemotherapy that affects the bone marrow. This medicine may be used for other purposes; ask your health care provider or pharmacist if you have questions. COMMON BRAND NAME(S): Neulasta What should I tell my health care provider before I take this medicine? They need to know if you have any of these conditions: -latex allergy -ongoing radiation therapy -sickle cell disease -skin reactions to acrylic adhesives (On-Body Injector only) -an unusual or allergic reaction to pegfilgrastim, filgrastim, other medicines,  foods, dyes, or preservatives -pregnant or trying to get pregnant -breast-feeding How should I use this medicine? This medicine is for injection under the skin. If you get this medicine at home, you will be taught how to prepare and give the pre-filled syringe or how to use the On-body Injector. Refer to the patient Instructions for Use for detailed instructions. Use exactly as directed. Take  your medicine at regular intervals. Do not take your medicine more often than directed. It is important that you put your used needles and syringes in a special sharps container. Do not put them in a trash can. If you do not have a sharps container, call your pharmacist or healthcare provider to get one. Talk to your pediatrician regarding the use of this medicine in children. Special care may be needed. Overdosage: If you think you have taken too much of this medicine contact a poison control center or emergency room at once. NOTE: This medicine is only for you. Do not share this medicine with others. What if I miss a dose? It is important not to miss your dose. Call your doctor or health care professional if you miss your dose. If you miss a dose due to an On-body Injector failure or leakage, a new dose should be administered as soon as possible using a single prefilled syringe for manual use. What may interact with this medicine? Interactions have not been studied. Give your health care provider a list of all the medicines, herbs, non-prescription drugs, or dietary supplements you use. Also tell them if you smoke, drink alcohol, or use illegal drugs. Some items may interact with your medicine. This list may not describe all possible interactions. Give your health care provider a list of all the medicines, herbs, non-prescription drugs, or dietary supplements you use. Also tell them if you smoke, drink alcohol, or use illegal drugs. Some items may interact with your medicine. What should I watch for while using  this medicine? You may need blood work done while you are taking this medicine. If you are going to need a MRI, CT scan, or other procedure, tell your doctor that you are using this medicine (On-Body Injector only). What side effects may I notice from receiving this medicine? Side effects that you should report to your doctor or health care professional as soon as possible: -allergic reactions like skin rash, itching or hives, swelling of the face, lips, or tongue -dizziness -fever -pain, redness, or irritation at site where injected -pinpoint red spots on the skin -shortness of breath or breathing problems -stomach or side pain, or pain at the shoulder -swelling -tiredness -trouble passing urine Side effects that usually do not require medical attention (report to your doctor or health care professional if they continue or are bothersome): -bone pain -muscle pain This list may not describe all possible side effects. Call your doctor for medical advice about side effects. You may report side effects to FDA at 1-800-FDA-1088. Where should I keep my medicine? Keep out of the reach of children. Store pre-filled syringes in a refrigerator between 2 and 8 degrees C (36 and 46 degrees F). Do not freeze. Keep in carton to protect from light. Throw away this medicine if it is left out of the refrigerator for more than 48 hours. Throw away any unused medicine after the expiration date. NOTE: This sheet is a summary. It may not cover all possible information. If you have questions about this medicine, talk to your doctor, pharmacist, or health care provider.  2015, [employer]. (2014-01-26 16:14:05) Dexamethasone injection What is this medicine? DEXAMETHASONE (dex a METH a sone) is a corticosteroid. It is used to treat inflammation of the skin, joints, lungs, and other organs. Common conditions treated include asthma, allergies, and arthritis. It is also used for other conditions, like  blood disorders and diseases of the adrenal glands. This medicine may be used for  other purposes; ask your health care provider or pharmacist if you have questions. COMMON BRAND NAME(S): Decadron, Solurex What should I tell my health care provider before I take this medicine? They need to know if you have any of these conditions: -blood clotting problems -Cushing's syndrome -diabetes -glaucoma -heart problems or disease -high blood pressure -infection like herpes, measles, tuberculosis, or chickenpox -kidney disease -liver disease -mental problems -myasthenia gravis -osteoporosis -previous heart attack -seizures -stomach, ulcer or intestine disease including colitis and diverticulitis -thyroid problem -an unusual or allergic reaction to dexamethasone, corticosteroids, other medicines, lactose, foods, dyes, or preservatives -pregnant or trying to get pregnant -breast-feeding How should I use this medicine? This medicine is for injection into a muscle, joint, lesion, soft tissue, or vein. It is given by a health care professional in a hospital or clinic setting. Talk to your pediatrician regarding the use of this medicine in children. Special care may be needed. Overdosage: If you think you have taken too much of this medicine contact a poison control center or emergency room at once. NOTE: This medicine is only for you. Do not share this medicine with others. What if I miss a dose? This may not apply. If you are having a series of injections over a prolonged period, try not to miss an appointment. Call your doctor or health care professional to reschedule if you are unable to keep an appointment. What may interact with this medicine? Do not take this medicine with any of the following medications: -mifepristone, RU-486 -vaccines This medicine may also interact with the following medications: -amphotericin B -antibiotics like clarithromycin, erythromycin, and  troleandomycin -aspirin and aspirin-like drugs -barbiturates like phenobarbital -carbamazepine -cholestyramine -cholinesterase inhibitors like donepezil, galantamine, rivastigmine, and tacrine -cyclosporine -digoxin -diuretics -ephedrine -female hormones, like estrogens or progestins and birth control pills -indinavir -isoniazid -ketoconazole -medicines for diabetes -medicines that improve muscle tone or strength for conditions like myasthenia gravis -NSAIDs, medicines for pain and inflammation, like ibuprofen or naproxen -phenytoin -rifampin -thalidomide -warfarin This list may not describe all possible interactions. Give your health care provider a list of all the medicines, herbs, non-prescription drugs, or dietary supplements you use. Also tell them if you smoke, drink alcohol, or use illegal drugs. Some items may interact with your medicine. What should I watch for while using this medicine? Your condition will be monitored carefully while you are receiving this medicine. If you are taking this medicine for a long time, carry an identification card with your name and address, the type and dose of your medicine, and your doctor's name and address. This medicine may increase your risk of getting an infection. Stay away from people who are sick. Tell your doctor or health care professional if you are around anyone with measles or chickenpox. Talk to your health care provider before you get any vaccines that you take this medicine. If you are going to have surgery, tell your doctor or health care professional that you have taken this medicine within the last twelve months. Ask your doctor or health care professional about your diet. You may need to lower the amount of salt you eat. The medicine can increase your blood sugar. If you are a diabetic check with your doctor if you need help adjusting the dose of your diabetic medicine. What side effects may I notice from receiving this  medicine? Side effects that you should report to your doctor or health care professional as soon as possible: -allergic reactions like skin rash, itching or hives, swelling  of the face, lips, or tongue -black or tarry stools -change in the amount of urine -changes in vision -confusion, excitement, restlessness, a false sense of well-being -fever, sore throat, sneezing, cough, or other signs of infection, wounds that will not heal -hallucinations -increased thirst -mental depression, mood swings, mistaken feelings of self importance or of being mistreated -pain in hips, back, ribs, arms, shoulders, or legs -pain, redness, or irritation at the injection site -redness, blistering, peeling or loosening of the skin, including inside the mouth -rounding out of face -swelling of feet or lower legs -unusual bleeding or bruising -unusual tired or weak -wounds that do not heal Side effects that usually do not require medical attention (report to your doctor or health care professional if they continue or are bothersome): -diarrhea or constipation -change in taste -headache -nausea, vomiting -skin problems, acne, thin and shiny skin -touble sleeping -unusual growth of hair on the face or body -weight gain This list may not describe all possible side effects. Call your doctor for medical advice about side effects. You may report side effects to FDA at 1-800-FDA-1088. Where should I keep my medicine? This drug is given in a hospital or clinic and will not be stored at home. NOTE: This sheet is a summary. It may not cover all possible information. If you have questions about this medicine, talk to your doctor, pharmacist, or health care provider.  2015, [employer] Standard. (2008-02-17 14:04:12) Diphenhydramine injection What is this medicine? DIPHENHYDRAMINE (dye fen HYE dra meen) is an antihistamine. It is used to treat the symptoms of an allergic reaction and motion sickness. It is also  used to treat Parkinson's disease. This medicine may be used for other purposes; ask your health care provider or pharmacist if you have questions. COMMON BRAND NAME(S): Benadryl What should I tell my health care provider before I take this medicine? They need to know if you have any of these conditions: -asthma or lung disease -glaucoma -high blood pressure or heart disease -liver disease -pain or difficulty passing urine -prostate trouble -ulcers or other stomach problems -an unusual or allergic reaction to diphenhydramine, antihistamines, other medicines foods, dyes, or preservatives -pregnant or trying to get pregnant -breast-feeding How should I use this medicine? This medicine is for injection into a vein or a muscle. It is usually given by a health care professional in a hospital or clinic setting. If you get this medicine at home, you will be taught how to prepare and give this medicine. Use exactly as directed. Take your medicine at regular intervals. Do not take your medicine more often than directed. It is important that you put your used needles and syringes in a special sharps container. Do not put them in a trash can. If you do not have a sharps container, call your pharmacist or healthcare provider to get one. Talk to your pediatrician regarding the use of this medicine in children. While this drug may be prescribed for selected conditions, precautions do apply. This medicine is not approved for use in newborns and premature babies. Patients over 7 years old may have a stronger reaction and need a smaller dose. Overdosage: If you think you have taken too much of this medicine contact a poison control center or emergency room at once. NOTE: This medicine is only for you. Do not share this medicine with others. What if I miss a dose? If you miss a dose, take it as soon as you can. If it is almost time for your next  dose, take only that dose. Do not take double or extra  doses. What may interact with this medicine? Do not take this medicine with any of the following medications: -MAOIs like Carbex, Eldepryl, Marplan, Nardil, and Parnate This medicine may also interact with the following medications: -alcohol -barbiturates, like phenobarbital -medicines for bladder spasm like oxybutynin, tolterodine -medicines for blood pressure -medicines for depression, anxiety, or psychotic disturbances -medicines for movement abnormalities or Parkinson's disease -medicines for sleep -other medicines for cold, cough or allergy -some medicines for the stomach like chlordiazepoxide, dicyclomine This list may not describe all possible interactions. Give your health care provider a list of all the medicines, herbs, non-prescription drugs, or dietary supplements you use. Also tell them if you smoke, drink alcohol, or use illegal drugs. Some items may interact with your medicine. What should I watch for while using this medicine? Your condition will be monitored carefully while you are receiving this medicine. Tell your doctor or healthcare professional if your symptoms do not start to get better or if they get worse. You may get drowsy or dizzy. Do not drive, use machinery, or do anything that needs mental alertness until you know how this medicine affects you. Do not stand or sit up quickly, especially if you are an older patient. This reduces the risk of dizzy or fainting spells. Alcohol may interfere with the effect of this medicine. Avoid alcoholic drinks. Your mouth may get dry. Chewing sugarless gum or sucking hard candy, and drinking plenty of water may help. Contact your doctor if the problem does not go away or is severe. What side effects may I notice from receiving this medicine? Side effects that you should report to your doctor or health care professional as soon as possible: -allergic reactions like skin rash, itching or hives, swelling of the face, lips, or  tongue -breathing problems -changes in vision -chills -confused, agitated, nervous -irregular or fast heartbeat -low blood pressure -seizures -tremor -trouble passing urine -unusual bleeding or bruising -unusually weak or tired Side effects that usually do not require medical attention (report to your doctor or health care professional if they continue or are bothersome): -constipation, diarrhea -drowsy -headache -loss of appetite -stomach upset, vomiting -sweating -thick mucous This list may not describe all possible side effects. Call your doctor for medical advice about side effects. You may report side effects to FDA at 1-800-FDA-1088. Where should I keep my medicine? Keep out of the reach of children. If you are using this medicine at home, you will be instructed on how to store this medicine. Throw away any unused medicine after the expiration date on the label. NOTE: This sheet is a summary. It may not cover all possible information. If you have questions about this medicine, talk to your doctor, pharmacist, or health care provider.  2015, [employer] Standard. (2008-02-15 14:28:35) Famotidine injection What is this medicine? FAMOTIDINE (fa MOE ti deen) is a type of antihistamine that blocks the release of stomach acid. It is used to treat stomach or intestinal ulcers. It can relieve ulcer pain and discomfort, and the heartburn from acid reflux. This medicine may be used for other purposes; ask your health care provider or pharmacist if you have questions. COMMON BRAND NAME(S): Pepcid What should I tell my health care provider before I take this medicine? They need to know if you have any of these conditions: -kidney or liver disease -an unusual or allergic reaction to famotidine, other medicines, foods, dyes, or preservatives -pregnant or trying to  get pregnant -breast-feeding How should I use this medicine? This medicine is for infusion into a vein. It is given by a  health care professional in a hospital or clinic setting. Talk to your pediatrician regarding the use of this medicine in children. Special care may be needed. Overdosage: If you think you have taken too much of this medicine contact a poison control center or emergency room at once. NOTE: This medicine is only for you. Do not share this medicine with others. What if I miss a dose? This does not apply. What may interact with this medicine? -delavirdine -itraconazole -ketoconazole This list may not describe all possible interactions. Give your health care provider a list of all the medicines, herbs, non-prescription drugs, or dietary supplements you use. Also tell them if you smoke, drink alcohol, or use illegal drugs. Some items may interact with your medicine. What should I watch for while using this medicine? Tell your doctor or health care professional if your condition does not start to get better or gets worse. Do not take with aspirin, ibuprofen, or other antiinflammatory medicines. These can aggravate your condition. Do not smoke cigarettes or drink alcohol. These increase irritation in your stomach and can increase the time it will take for ulcers to heal. Cigarettes and alcohol can also worsen acid reflux or heartburn. If you get black, tarry stools or vomit up what looks like coffee grounds, call your doctor or health care professional at once. You may have a bleeding ulcer. What side effects may I notice from receiving this medicine? Side effects that you should report to your doctor or health care professional as soon as possible: -allergic reactions like skin rash, itching or hives, swelling of the face, lips, or tongue -agitation, nervousness -confusion -hallucinations Side effects that usually do not require medical attention (report to your doctor or health care professional if they continue or are bothersome): -constipation -diarrhea -dizziness -headache This list may not  describe all possible side effects. Call your doctor for medical advice about side effects. You may report side effects to FDA at 1-800-FDA-1088. Where should I keep my medicine? This medicine is given in a hospital or clinic. You will not be given this medicine to store at home. NOTE: This sheet is a summary. It may not cover all possible information. If you have questions about this medicine, talk to your doctor, pharmacist, or health care provider.  2015, [employer] Standard. (2008-03-01 13:24:51) Fosaprepitant injection What is this medicine? FOSAPREPITANT (fos ap RE pi tant) is used together with other medicines to prevent nausea and vomiting caused by cancer treatment (chemotherapy). This medicine may be used for other purposes; ask your health care provider or pharmacist if you have questions. COMMON BRAND NAME(S): Emend What should I tell my health care provider before I take this medicine? They need to know if you have any of these conditions: -liver disease -an unusual or allergic reaction to fosaprepitant, aprepitant, medicines, foods, dyes, or preservatives -pregnant or trying to get pregnant -breast-feeding How should I use this medicine? This medicine is for injection into a vein. It is given by a health care professional in a hospital or clinic setting. Talk to your pediatrician regarding the use of this medicine in children. Special care may be needed. Overdosage: If you think you have taken too much of this medicine contact a poison control center or emergency room at once. NOTE: This medicine is only for you. Do not share this medicine with others. What if I  miss a dose? This does not apply. What may interact with this medicine? Do not take this medicine with any of these medicines: -cisapride -pimozide -ranolazine This medicine may also interact with the following medications: -diltiazem -female hormones, like estrogens or progestins and birth control  pills -medicines for fungal infections like ketoconazole and itraconazole -medicines for HIV -medicines for seizures or to control epilepsy like carbamazepine or phenytoin -medicines used for sleep or anxiety disorders like alprazolam, diazepam, or midazolam -nefazodone -paroxetine -rifampin -some chemotherapy medications like etoposide, ifosfamide, vinblastine, vincristine -some antibiotics like clarithromycin, erythromycin, troleandomycin -steroid medicines like dexamethasone or methylprednisolone -tolbutamide -warfarin This list may not describe all possible interactions. Give your health care provider a list of all the medicines, herbs, non-prescription drugs, or dietary supplements you use. Also tell them if you smoke, drink alcohol, or use illegal drugs. Some items may interact with your medicine. What should I watch for while using this medicine? Do not take this medicine if you already have nausea and vomiting. Ask your health care provider what to do if you already have nausea. Birth control pills may not work properly while you are taking this medicine. Talk to your doctor about using an extra method of birth control. This medicine should not be used continuously for a long time. Visit your doctor or health care professional for regular check-ups. This medicine may change your liver function blood test results. What side effects may I notice from receiving this medicine? Side effects that you should report to your doctor or health care professional as soon as possible: -allergic reactions like skin rash, itching or hives, swelling of the face, lips, or tongue -breathing problems -changes in heart rhythm -high or low blood pressure -pain, redness, or irritation at site where injected -rectal bleeding -serious dizziness or disorientation, confusion -sharp or severe stomach pain -sharp pain in your leg Side effects that usually do not require medical attention (report to your  doctor or health care professional if they continue or are bothersome): -constipation or diarrhea -hair loss -headache -hiccups -loss of appetite -nausea -upset stomach -tiredness This list may not describe all possible side effects. Call your doctor for medical advice about side effects. You may report side effects to FDA at 1-800-FDA-1088. Where should I keep my medicine? This drug is given in a hospital or clinic and will not be stored at home. NOTE: This sheet is a summary. It may not cover all possible information. If you have questions about this medicine, talk to your doctor, pharmacist, or health care provider.  2015, [employer] Standard. (2009-10-09 12:46:13) Ondansetron injection What is this medicine? ONDANSETRON (on DAN se tron) is used to treat nausea and vomiting caused by chemotherapy. It is also used to prevent or treat nausea and vomiting after surgery. This medicine may be used for other purposes; ask your health care provider or pharmacist if you have questions. COMMON BRAND NAME(S): Zofran What should I tell my health care provider before I take this medicine? They need to know if you have any of these conditions: -heart disease -history of irregular heartbeat -liver disease -low levels of magnesium or potassium in the blood -an unusual or allergic reaction to ondansetron, granisetron, other medicines, foods, dyes, or preservatives -pregnant or trying to get pregnant -breast-feeding How should I use this medicine? This medicine is for infusion into a vein. It is given by a health care professional in a hospital or clinic setting. Talk to your pediatrician regarding the use of this medicine in  children. Special care may be needed. Overdosage: If you think you have taken too much of this medicine contact a poison control center or emergency room at once. NOTE: This medicine is only for you. Do not share this medicine with others. What if I miss a dose? This  does not apply. What may interact with this medicine? Do not take this medicine with any of the following medications: -apomorphine -certain medicines for fungal infections like fluconazole, itraconazole, ketoconazole, posaconazole, voriconazole -cisapride -dofetilide -dronedarone -pimozide -thioridazine -ziprasidone This medicine may also interact with the following medications: -carbamazepine -certain medicines for depression, anxiety, or psychotic disturbances -fentanyl -linezolid -MAOIs like Carbex, Eldepryl, Marplan, Nardil, and Parnate -methylene blue (injected into a vein) -other medicines that prolong the QT interval (cause an abnormal heart rhythm) -phenytoin -rifampicin -tramadol This list may not describe all possible interactions. Give your health care provider a list of all the medicines, herbs, non-prescription drugs, or dietary supplements you use. Also tell them if you smoke, drink alcohol, or use illegal drugs. Some items may interact with your medicine. What should I watch for while using this medicine? Your condition will be monitored carefully while you are receiving this medicine. What side effects may I notice from receiving this medicine? Side effects that you should report to your doctor or health care professional as soon as possible: -allergic reactions like skin rash, itching or hives, swelling of the face, lips, or tongue -breathing problems -confusion -dizziness -fast or irregular heartbeat -feeling faint or lightheaded, falls -fever and chills -loss of balance or coordination -seizures -sweating -swelling of the hands and feet -tightness in the chest -tremors -unusually weak or tired Side effects that usually do not require medical attention (report to your doctor or health care professional if they continue or are bothersome): -constipation or diarrhea -headache This list may not describe all possible side effects. Call your doctor for  medical advice about side effects. You may report side effects to FDA at 1-800-FDA-1088. Where should I keep my medicine? This drug is given in a hospital or clinic and will not be stored at home. NOTE: This sheet is a summary. It may not cover all possible information. If you have questions about this medicine, talk to your doctor, pharmacist, or health care provider.  2015, [employer] Standard. (2013-08-03 16:18:28) Metoclopramide tablets What is this medicine? METOCLOPRAMIDE (met oh kloe PRA mide) is used to treat the symptoms of gastroesophageal reflux disease (GERD) like heartburn. It is also used to treat people with slow emptying of the stomach and intestinal tract. This medicine may be used for other purposes; ask your health care provider or pharmacist if you have questions. COMMON BRAND NAME(S): Reglan What should I tell my health care provider before I take this medicine? They need to know if you have any of these conditions: -breast cancer -depression -diabetes -heart failure -high blood pressure -kidney disease -liver disease -Parkinson's disease or a movement disorder -pheochromocytoma -seizures -stomach obstruction, bleeding, or perforation -an unusual or allergic reaction to metoclopramide, procainamide, sulfites, other medicines, foods, dyes, or preservatives -pregnant or trying to get pregnant -breast-feeding How should I use this medicine? Take this medicine by mouth with a glass of water. Follow the directions on the prescription label. Take this medicine on an empty stomach, about 30 minutes before eating. Take your doses at regular intervals. Do not take your medicine more often than directed. Do not stop taking except on the advice of your doctor or health care professional. A special MedGuide  will be given to you by the pharmacist with each prescription and refill. Be sure to read this information carefully each time. Talk to your pediatrician regarding the  use of this medicine in children. Special care may be needed. Overdosage: If you think you have taken too much of this medicine contact a poison control center or emergency room at once. NOTE: This medicine is only for you. Do not share this medicine with others. What if I miss a dose? If you miss a dose, take it as soon as you can. If it is almost time for your next dose, take only that dose. Do not take double or extra doses. What may interact with this medicine? -acetaminophen -cyclosporine -digoxin -medicines for blood pressure -medicines for diabetes, including insulin -medicines for hay fever and other allergies -medicines for depression, especially an Monoamine Oxidase Inhibitor (MAOI) -medicines for Parkinson's disease, like levodopa -medicines for sleep or for pain -tetracycline This list may not describe all possible interactions. Give your health care provider a list of all the medicines, herbs, non-prescription drugs, or dietary supplements you use. Also tell them if you smoke, drink alcohol, or use illegal drugs. Some items may interact with your medicine. What should I watch for while using this medicine? It may take a few weeks for your stomach condition to start to get better. However, do not take this medicine for longer than 12 weeks. The longer you take this medicine, and the more you take it, the greater your chances are of developing serious side effects. If you are an elderly patient, a female patient, or you have diabetes, you may be at an increased risk for side effects from this medicine. Contact your doctor immediately if you start having movements you cannot control such as lip smacking, rapid movements of the tongue, involuntary or uncontrollable movements of the eyes, head, arms and legs, or muscle twitches and spasms. Patients and their families should watch out for worsening depression or thoughts of suicide. Also watch out for any sudden or severe changes in  feelings such as feeling anxious, agitated, panicky, irritable, hostile, aggressive, impulsive, severely restless, overly excited and hyperactive, or not being able to sleep. If this happens, especially at the beginning of treatment or after a change in dose, call your doctor. Do not treat yourself for high fever. Ask your doctor or health care professional for advice. You may get drowsy or dizzy. Do not drive, use machinery, or do anything that needs mental alertness until you know how this drug affects you. Do not stand or sit up quickly, especially if you are an older patient. This reduces the risk of dizzy or fainting spells. Alcohol can make you more drowsy and dizzy. Avoid alcoholic drinks. What side effects may I notice from receiving this medicine? Side effects that you should report to your doctor or health care professional as soon as possible: -allergic reactions like skin rash, itching or hives, swelling of the face, lips, or tongue -abnormal production of milk in females -breast enlargement in both males and females -change in the way you walk -difficulty moving, speaking or swallowing -drooling, lip smacking, or rapid movements of the tongue -excessive sweating -fever -involuntary or uncontrollable movements of the eyes, head, arms and legs -irregular heartbeat or palpitations -muscle twitches and spasms -unusually weak or tired Side effects that usually do not require medical attention (report to your doctor or health care professional if they continue or are bothersome): -change in sex drive or performance -depressed  mood -diarrhea -difficulty sleeping -headache -menstrual changes -restless or nervous This list may not describe all possible side effects. Call your doctor for medical advice about side effects. You may report side effects to FDA at 1-800-FDA-1088. Where should I keep my medicine? Keep out of the reach of children. Store at room temperature between 20 and 25  degrees C (68 and 77 degrees F). Protect from light. Keep container tightly closed. Throw away any unused medicine after the expiration date. NOTE: This sheet is a summary. It may not cover all possible information. If you have questions about this medicine, talk to your doctor, pharmacist, or health care provider.  2015, [employer]. (2012-02-24 13:04:38) Prochlorperazine tablets What is this medicine? PROCHLORPERAZINE (proe klor PER a zeen) helps to control severe nausea and vomiting. This medicine is also used to treat schizophrenia. It can also help patients who experience anxiety that is not due to psychological illness. This medicine may be used for other purposes; ask your health care provider or pharmacist if you have questions. COMMON BRAND NAME(S): Compazine What should I tell my health care provider before I take this medicine? They need to know if you have any of these conditions: -blood disorders or disease -dementia -liver disease or jaundice -Parkinson's disease -uncontrollable movement disorder -an unusual or allergic reaction to prochlorperazine, other medicines, foods, dyes, or preservatives -pregnant or trying to get pregnant -breast-feeding How should I use this medicine? Take this medicine by mouth with a glass of water. Follow the directions on the prescription label. Take your doses at regular intervals. Do not take your medicine more often than directed. Do not stop taking this medicine suddenly. This can cause nausea, vomiting, and dizziness. Ask your doctor or health care professional for advice. Talk to your pediatrician regarding the use of this medicine in children. Special care may be needed. While this drug may be prescribed for children as young as 2 years for selected conditions, precautions do apply. Overdosage: If you think you have taken too much of this medicine contact a poison control center or emergency room at once. NOTE: This medicine is  only for you. Do not share this medicine with others. What if I miss a dose? If you miss a dose, take it as soon as you can. If it is almost time for your next dose, take only that dose. Do not take double or extra doses. What may interact with this medicine? Do not take this medicine with any of the following medications: -amoxapine -antidepressants like citalopram, escitalopram, fluoxetine, paroxetine, and sertraline -deferoxamine -dofetilide -maprotiline -tricyclic antidepressants like amitriptyline, clomipramine, imipramine, nortiptyline and others This medicine may also interact with the following medications: -lithium -medicines for pain -phenytoin -propranolol -warfarin This list may not describe all possible interactions. Give your health care provider a list of all the medicines, herbs, non-prescription drugs, or dietary supplements you use. Also tell them if you smoke, drink alcohol, or use illegal drugs. Some items may interact with your medicine. What should I watch for while using this medicine? Visit your doctor or health care professional for regular checks on your progress. You may get drowsy or dizzy. Do not drive, use machinery, or do anything that needs mental alertness until you know how this medicine affects you. Do not stand or sit up quickly, especially if you are an older patient. This reduces the risk of dizzy or fainting spells. Alcohol may interfere with the effect of this medicine. Avoid alcoholic drinks. This medicine can reduce the  response of your body to heat or cold. Dress warm in cold weather and stay hydrated in hot weather. If possible, avoid extreme temperatures like saunas, hot tubs, very hot or cold showers, or activities that can cause dehydration such as vigorous exercise. This medicine can make you more sensitive to the sun. Keep out of the sun. If you cannot avoid being in the sun, wear protective clothing and use sunscreen. Do not use sun lamps or  tanning beds/booths. Your mouth may get dry. Chewing sugarless gum or sucking hard candy, and drinking plenty of water may help. Contact your doctor if the problem does not go away or is severe. What side effects may I notice from receiving this medicine? Side effects that you should report to your doctor or health care professional as soon as possible: -blurred vision -breast enlargement in men or women -breast milk in women who are not breast-feeding -chest pain, fast or irregular heartbeat -confusion, restlessness -dark yellow or brown urine -difficulty breathing or swallowing -dizziness or fainting spells -drooling, shaking, movement difficulty (shuffling walk) or rigidity -fever, chills, sore throat -involuntary or uncontrollable movements of the eyes, mouth, head, arms, and legs -seizures -stomach area pain -unusually weak or tired -unusual bleeding or bruising -yellowing of skin or eyes Side effects that usually do not require medical attention (report to your doctor or health care professional if they continue or are bothersome): -difficulty passing urine -difficulty sleeping -headache -sexual dysfunction -skin rash, or itching This list may not describe all possible side effects. Call your doctor for medical advice about side effects. You may report side effects to FDA at 1-800-FDA-1088. Where should I keep my medicine? Keep out of the reach of children. Store at room temperature between 15 and 30 degrees C (59 and 86 degrees F). Protect from light. Throw away any unused medicine after the expiration date. NOTE: This sheet is a summary. It may not cover all possible information. If you have questions about this medicine, talk to your doctor, pharmacist, or health care provider.  2015, [employer] Standard. (2012-03-16 16:59:39) Lidocaine; Prilocaine cream What is this medicine? LIDOCAINE; PRILOCAINE (LYE doe kane; PRIL oh kane) is a topical anesthetic that causes loss of  feeling in the skin and surrounding tissues. It is used to numb the skin before procedures or injections. This medicine may be used for other purposes; ask your health care provider or pharmacist if you have questions. COMMON BRAND NAME(S): EMLA What should I tell my health care provider before I take this medicine? They need to know if you have any of these conditions: -glucose-6-phosphate deficiencies -heart disease -kidney or liver disease -methemoglobinemia -an unusual or allergic reaction to lidocaine, prilocaine, other medicines, foods, dyes, or preservatives -pregnant or trying to get pregnant -breast-feeding How should I use this medicine? This medicine is for external use only on the skin. Do not take by mouth. Follow the directions on the prescription label. Wash hands before and after use. Do not use more or leave in contact with the skin longer than directed. Do not apply to eyes or open wounds. It can cause irritation and blurred or temporary loss of vision. If this medicine comes in contact with your eyes, immediately rinse the eye with water. Do not touch or rub the eye. Contact your health care provider right away. Talk to your pediatrician regarding the use of this medicine in children. While this medicine may be prescribed for children for selected conditions, precautions do apply. Overdosage: If you think  you have taken too much of this medicine contact a poison control center or emergency room at once. NOTE: This medicine is only for you. Do not share this medicine with others. What if I miss a dose? This medicine is usually only applied once prior to each procedure. It must be in contact with the skin for a period of time for it to work. If you applied this medicine later than directed, tell your health care professional before starting the procedure. What may interact with this medicine? -acetaminophen -chloroquine -dapsone -medicines to control heart rhythm -nitrates  like nitroglycerin and nitroprusside -other ointments, creams, or sprays that may contain anesthetic medicine -phenobarbital -phenytoin -quinine -sulfonamides like sulfacetamide, sulfamethoxazole, sulfasalazine and others This list may not describe all possible interactions. Give your health care provider a list of all the medicines, herbs, non-prescription drugs, or dietary supplements you use. Also tell them if you smoke, drink alcohol, or use illegal drugs. Some items may interact with your medicine. What should I watch for while using this medicine? Be careful to avoid injury to the treated area while it is numb and you are not aware of pain. Avoid scratching, rubbing, or exposing the treated area to hot or cold temperatures until complete sensation has returned. The numb feeling will wear off a few hours after applying the cream. What side effects may I notice from receiving this medicine? Side effects that you should report to your doctor or health care professional as soon as possible: -blurred vision -chest pain -difficulty breathing -dizziness -drowsiness -fast or irregular heartbeat -skin rash or itching -swelling of your throat, lips, or face -trembling Side effects that usually do not require medical attention (report to your doctor or health care professional if they continue or are bothersome): -changes in ability to feel hot or cold -redness and swelling at the application site This list may not describe all possible side effects. Call your doctor for medical advice about side effects. You may report side effects to FDA at 1-800-FDA-1088. Where should I keep my medicine? Keep out of reach of children. Store at room temperature between 15 and 30 degrees C (59 and 86 degrees F). Keep container tightly closed. Throw away any unused medicine after the expiration date. NOTE: This sheet is a summary. It may not cover all possible information. If you have questions about this  medicine, talk to your doctor, pharmacist, or health care provider.  2015, [employer] Standard. (2008-05-01 17:14:35) Implanted Port Insertion An implanted port is a central line that has a round shape and is placed under the skin. It is used as a long-term IV access for:   Medicines, such as chemotherapy.   Fluids.   Liquid nutrition, such as total parenteral nutrition (TPN).   Blood samples.  LET [facility] CARE PROVIDER KNOW ABOUT:  Allergies to food or medicine.   Medicines taken, including vitamins, herbs, eye drops, creams, and over-the-counter medicines.   Any allergies to heparin.  Use of steroids (by mouth or creams).   Previous problems with anesthetics or numbing medicines.   History of bleeding problems or blood clots.   Previous surgery.   Other health problems, including diabetes and kidney problems.   Possibility of pregnancy, if this applies. RISKS AND COMPLICATIONS Generally, this is a safe procedure. However, as with any procedure, problems can occur. Possible problems include:  Damage to the blood vessel, bruising, or bleeding at the puncture site.   Infection.  Blood clot in the vessel that the port  is in.  Breakdown of the skin over your port.  Very rarely a person may develop a condition called a pneumothorax, a collection of air in the chest that may cause one of the lungs to collapse. The placement of these catheters with the appropriate imaging guidance significantly decreases the risk of a pneumothorax.  BEFORE THE PROCEDURE   Your health care provider may want you to have blood tests. These tests can help tell how well your kidneys and liver are working. They can also show how well your blood clots.   If you take blood thinners (anticoagulant medicines), ask your health care provider when you should stop taking them.   Make arrangements for someone to drive you home. This is necessary if you have been sedated for your  procedure.  PROCEDURE  Port insertion usually takes about 30-45 minutes.   An IV needle will be inserted in your arm. Medicine for pain and medicine to help relax you (sedative) will flow directly into your body through this needle.   You will lie on an exam table, and you will be connected to monitors to keep track of your heart rate, blood pressure, and breathing throughout the procedure.  An oxygen monitoring device may be attached to your finger. Oxygen will be given.   Everything will be kept as germ free (sterile) as possible during the procedure. The skin near the point of the incision will be cleansed with antiseptic, and the area will be draped with sterile towels. The skin and deeper tissues over the port area will be made numb with a local anesthetic.  Two small cuts (incisions) will be made in the skin to insert the port. One will be made in the neck to obtain access to the vein where the catheter will lie.   Because the port reservoir will be placed under the skin, a small skin incision will be made in the upper chest, and a small pocket for the port will be made under the skin. The catheter that will be connected to the port tunnels to a large central vein in the chest. A small, raised area will remain on your body at the site of the reservoir when the procedure is complete.  The port placement will be done under imaging guidance to ensure the proper placement.  The reservoir has a silicone covering that can be punctured with a special needle.   The port will be flushed with normal saline, and blood will be drawn to make sure it is working properly.  There will be nothing remaining outside the skin when the procedure is finished.   Incisions will be held together by stitches, surgical glue, or a special tape. AFTER THE PROCEDURE  You will stay in a recovery area until the anesthesia has worn off. Your blood pressure and pulse will be checked.  A final chest X-ray  will be taken to check the placement of the port and to ensure that there is no injury to your lung. Document Released: 08/17/2013 Document Revised: 03/13/2014 Document Reviewed: 08/17/2013 [facility] Patient Information 2015 [place], [place]. This information is not intended to replace advice given to you by your health care provider. Make sure you discuss any questions you have with your health care provider. Chemotherapy Many people are apprehensive about chemotherapy due to concerns over uncomfortable side effects. However, managements for side effects have come a long way. Many side effects once associated with chemotherapy can be prevented and/or controlled. WHAT IS CHEMOTHERAPY? Chemotherapy is  the general term for any treatment involving the use of chemical agents. Chemotherapy can be given through a vein, most commonly through an implanted port* or PICC line.* It can also be delivered by mouth (orally) in the form of a pill. The main goal of chemotherapy is to kill cancer cells and stop them from growing. It can destroy and eliminate cancer cells where the cancer started (primary tumor location) and throughout the body, often far away from the original cancer. It is a treatment that not only targets the original cancer location, but also the entire body (systemic treatment) for full effect and results. Chemotherapy works by destroying cancer cells. Unfortunately, it cannot tell the difference between a cancer cell and some healthy cells. This results in the death of noncancerous cells, such as hair and blood cells. Harm to healthy cells is what causes side effects. These cells usually repair themselves after chemotherapy. Because some drugs work better together rather than alone, 2 or more drugs are often given at the same time. This is called combination chemotherapy. Depending on the type of cancer and how advanced it is, chemotherapy can be used for different goals:  Cure the cancer.  Keep the  cancer from spreading.  Slow the cancer's growth.  Kill cancer cells that may have spread to other parts of the body from the original tumor.  Relieve symptoms caused by cancer. You and your caregiver will decide what drug or combination of drugs you will get. Your caregiver will choose the doses, how the drugs will be given, how often, and how long you will get treatment. All of these decisions will depend on the type of cancer, where it is, how big it is, and how it is affecting your normal body functions and overall health. *Implanted port - A device that is implanted under your skin so that medicines may be delivered directly into your blood system. *PICC line (peripherally inserted central catheter) - A long, slender, flexible tube. This tube is often inserted into a vein, typically in the upper arm. The tip stops in the large central vein that leads to your heart. Document Released: 08/24/2007 Document Revised: 01/19/2012 Document Reviewed: 02/08/2009 [facility] Patient Information 2015 [place], [place]. This information is not intended to replace advice given to you by your health care provider. Make sure you discuss any questions you have with your health care provider. External Beam Radiation Therapy External beam radiation therapy is a radiation treatment. It may be done to:  Treat cancer. The radiation may be used to:  Destroy cancer cells. Radiation delivered during the treatment damages cancer cells. It also damages normal cells, but normal cells have the DNA to repair themselves while cancer cells do not.  Help with symptoms of your cancer.  Stop the growth of any remaining cancer cells after surgery.  Prevent cancer cells from growing in areas that do not have evidence of cancer (prophylactic radiation therapy).  Treat or shrink a tumor.  Reduce pain (palliative therapy). The therapy delivers higher doses of radiation than X-rays, CT scans, and most other imaging tests.  Compared with internal radiation therapy, external beam radiation therapy can deliver radiation to a fairly large area.  The amount of radiation you will receive and the length of therapy depends on your medical condition. You should not feel the radiation being delivered or any pain during your therapy. RISKS AND COMPLICATIONS Most people experience side effects from the therapy. Side effects depend on the amount of radiation and the  part of your body exposed to radiation. For example:  Hair loss may occur if the radiation therapy is directed to your head.  Coughing or difficulty swallowing may occur if the radiation therapy is directed to your head, neck, or chest.  Nausea, vomiting, or diarrhea may occur if the radiation therapy is directed to your abdomen or pelvis.  Bladder problems, frequent urination, or sexual dysfunction may occur if the radiation therapy is directed to your bladder, kidney, or prostate. Regardless of the amount or location of the radiation, you will probably have fatigue. Other side effects may include:  Red, flaking skin in the affected area.  Hair loss in the affected area.  Itching in the affected area. Side effects may take 2-3 weeks to develop. Most side effects are temporary and can be controlled. Once the therapy is complete, side effects will not stop right away. It can take up to 3-4 weeks for you to regain your energy or for side effects to lessen. Your body does heal from the radiation. BEFORE THE PROCEDURE There will be a planning session (simulation). During the session:  Your health care provider will plan exactly where the radiation will be delivered (treatment field).  You will be positioned for your therapy. The goal is to have a position that can be reproduced for each therapy session.  Temporary marks may be drawn on your body. Permanent marks may also be drawn on your body in order for you to be positioned the same way for each therapy  session. PROCEDURE  You will either lie on a table or sit in a chair in the position determined for your therapy.  The radiation machine (linear accelerator) will move around you to deliver the radiation in exact doses from many angles. AFTER THE PROCEDURE You may return to your normal schedule including diet, activities, and medicines, unless your health care provider tells you otherwise. Document Released: 03/15/2009 Document Revised: 03/13/2014 Document Reviewed: 10/05/2013 [facility] Patient Information 2015 [place], [place]. This information is not intended to replace advice given to you by your health care provider. Make sure you discuss any questions you have with your health care provider.

## 2014-07-06 ENCOUNTER — Telehealth (HOSPITAL_COMMUNITY): Payer: Self-pay | Admitting: Hematology and Oncology

## 2014-07-06 ENCOUNTER — Encounter (HOSPITAL_COMMUNITY): Payer: Medicare HMO

## 2014-07-06 ENCOUNTER — Encounter (HOSPITAL_COMMUNITY): Payer: Self-pay | Admitting: Pharmacy Technician

## 2014-07-06 DIAGNOSIS — C549 Malignant neoplasm of corpus uteri, unspecified: Secondary | ICD-10-CM

## 2014-07-06 NOTE — Progress Notes (Unsigned)
Chemo teaching done and consent signed for Carboplatin/Taxol. Chemo/med calendar given to patient. Patient states that her XRT appt is on Wed 07/12/14. Distress screening done.

## 2014-07-06 NOTE — Telephone Encounter (Signed)
Requested a ref for pt for [employer]. spk with Andee Poles

## 2014-07-07 ENCOUNTER — Telehealth (HOSPITAL_COMMUNITY): Payer: Self-pay | Admitting: *Deleted

## 2014-07-07 ENCOUNTER — Other Ambulatory Visit: Payer: Self-pay | Admitting: Radiology

## 2014-07-10 ENCOUNTER — Other Ambulatory Visit: Payer: Self-pay | Admitting: Radiology

## 2014-07-11 ENCOUNTER — Other Ambulatory Visit (HOSPITAL_COMMUNITY): Payer: Self-pay | Admitting: Oncology

## 2014-07-11 ENCOUNTER — Encounter (HOSPITAL_COMMUNITY): Payer: Self-pay

## 2014-07-11 ENCOUNTER — Encounter: Payer: Self-pay | Admitting: *Deleted

## 2014-07-11 ENCOUNTER — Ambulatory Visit (HOSPITAL_COMMUNITY)
Admission: RE | Admit: 2014-07-11 | Discharge: 2014-07-11 | Disposition: A | Discharge status: Home / Self Care | Payer: Medicare HMO | Source: Ambulatory Visit | Attending: Oncology | Admitting: Oncology

## 2014-07-11 DIAGNOSIS — R52 Pain, unspecified: Secondary | ICD-10-CM | POA: Insufficient documentation

## 2014-07-11 DIAGNOSIS — Z9089 Acquired absence of other organs: Secondary | ICD-10-CM | POA: Diagnosis not present

## 2014-07-11 DIAGNOSIS — Z79899 Other long term (current) drug therapy: Secondary | ICD-10-CM | POA: Diagnosis not present

## 2014-07-11 DIAGNOSIS — E119 Type 2 diabetes mellitus without complications: Secondary | ICD-10-CM | POA: Insufficient documentation

## 2014-07-11 DIAGNOSIS — Z452 Encounter for adjustment and management of vascular access device: Secondary | ICD-10-CM | POA: Insufficient documentation

## 2014-07-11 DIAGNOSIS — F411 Generalized anxiety disorder: Secondary | ICD-10-CM | POA: Insufficient documentation

## 2014-07-11 DIAGNOSIS — C55 Malignant neoplasm of uterus, part unspecified: Secondary | ICD-10-CM | POA: Diagnosis not present

## 2014-07-11 DIAGNOSIS — I10 Essential (primary) hypertension: Secondary | ICD-10-CM | POA: Insufficient documentation

## 2014-07-11 DIAGNOSIS — M1990 Unspecified osteoarthritis, unspecified site: Secondary | ICD-10-CM | POA: Insufficient documentation

## 2014-07-11 DIAGNOSIS — E785 Hyperlipidemia, unspecified: Secondary | ICD-10-CM | POA: Diagnosis not present

## 2014-07-11 DIAGNOSIS — Z791 Long term (current) use of non-steroidal anti-inflammatories (NSAID): Secondary | ICD-10-CM | POA: Insufficient documentation

## 2014-07-11 DIAGNOSIS — K7689 Other specified diseases of liver: Secondary | ICD-10-CM | POA: Diagnosis not present

## 2014-07-11 LAB — CBC WITH DIFFERENTIAL/PLATELET
Basophils Absolute: 0 10*3/uL (ref 0.0–0.1)
Basophils Relative: 0 % (ref 0–1)
Eosinophils Absolute: 0.2 10*3/uL (ref 0.0–0.7)
Eosinophils Relative: 3 % (ref 0–5)
HCT: 36.9 % (ref 36.0–46.0)
Hemoglobin: 12.1 g/dL (ref 12.0–15.0)
LYMPHS PCT: 33 % (ref 12–46)
Lymphs Abs: 2.4 10*3/uL (ref 0.7–4.0)
MCH: 29.2 pg (ref 26.0–34.0)
MCHC: 32.8 g/dL (ref 30.0–36.0)
MCV: 89.1 fL (ref 78.0–100.0)
Monocytes Absolute: 0.5 10*3/uL (ref 0.1–1.0)
Monocytes Relative: 7 % (ref 3–12)
Neutro Abs: 4.1 10*3/uL (ref 1.7–7.7)
Neutrophils Relative %: 57 % (ref 43–77)
PLATELETS: 292 10*3/uL (ref 150–400)
RBC: 4.14 MIL/uL (ref 3.87–5.11)
RDW: 13.4 % (ref 11.5–15.5)
WBC: 7.2 10*3/uL (ref 4.0–10.5)

## 2014-07-11 LAB — PROTIME-INR
INR: 1.03 (ref 0.00–1.49)
PROTHROMBIN TIME: 13.5 s (ref 11.6–15.2)

## 2014-07-11 LAB — GLUCOSE, CAPILLARY: Glucose-Capillary: 153 mg/dL — ABNORMAL HIGH (ref 70–99)

## 2014-07-11 LAB — APTT: aPTT: 29 seconds (ref 24–37)

## 2014-07-11 MED ORDER — LIDOCAINE HCL 1 % IJ SOLN
INTRAMUSCULAR | Status: AC
  Filled 2014-07-11: qty 20

## 2014-07-11 MED ORDER — CEFAZOLIN SODIUM-DEXTROSE 2-3 GM-% IV SOLR
2.0000 g | Freq: Once | INTRAVENOUS | Status: AC
  Administered 2014-07-11: 2 g via INTRAVENOUS

## 2014-07-11 MED ORDER — HEPARIN SOD (PORK) LOCK FLUSH 100 UNIT/ML IV SOLN: 500.0000 [IU] | Freq: Once | INTRAVENOUS | Status: DC

## 2014-07-11 MED ORDER — HEPARIN SOD (PORK) LOCK FLUSH 100 UNIT/ML IV SOLN
INTRAVENOUS | Status: AC
  Filled 2014-07-11: qty 5

## 2014-07-11 MED ORDER — CEFAZOLIN SODIUM-DEXTROSE 2-3 GM-% IV SOLR
INTRAVENOUS | Status: AC
  Filled 2014-07-11: qty 50

## 2014-07-11 MED ORDER — ONDANSETRON HCL 4 MG/2ML IJ SOLN
4.0000 mg | INTRAMUSCULAR | Status: DC
  Administered 2014-07-11: 4 mg via INTRAVENOUS
  Filled 2014-07-11: qty 2

## 2014-07-11 MED ORDER — FENTANYL CITRATE 0.05 MG/ML IJ SOLN
INTRAMUSCULAR | Status: AC
  Filled 2014-07-11: qty 4

## 2014-07-11 MED ORDER — MIDAZOLAM HCL 2 MG/2ML IJ SOLN
INTRAMUSCULAR | Status: AC | PRN
  Administered 2014-07-11: 2 mg via INTRAVENOUS

## 2014-07-11 MED ORDER — MIDAZOLAM HCL 2 MG/2ML IJ SOLN
INTRAMUSCULAR | Status: DC
Start: 2014-07-11 — End: 2014-07-12
  Filled 2014-07-11: qty 4

## 2014-07-11 MED ORDER — FENTANYL CITRATE 0.05 MG/ML IJ SOLN
INTRAMUSCULAR | Status: AC | PRN
Start: 2014-07-11 — End: 2014-07-11
  Administered 2014-07-11: 50 ug via INTRAVENOUS
  Administered 2014-07-11: 100 ug via INTRAVENOUS
  Administered 2014-07-11: 50 ug via INTRAVENOUS

## 2014-07-11 MED ORDER — SODIUM CHLORIDE 0.9 % IV SOLN
INTRAVENOUS | Status: DC
  Administered 2014-07-11: 13:00:00 via INTRAVENOUS

## 2014-07-11 NOTE — Procedures (Signed)
Procedure: US/Fluoro guided Rt IJ port catheter placement.   Findings: Tip at cavo-atrial junction.  Ok to use catheter No Sig blood loss No complication

## 2014-07-11 NOTE — Discharge Instructions (Signed)

## 2014-07-11 NOTE — Progress Notes (Signed)
[facility] Psychosocial Distress Screening Clinical Social Work  Clinical Social Work was referred by distress screening protocol.  The patient scored a 5 on the Psychosocial Distress Thermometer which indicates mild distress. Clinical Social Worker phoned pt at home to assess for distress and other psychosocial needs. Pt reports to have a good support network to assist her with coping and practical concerns related to her illness. She reports she lives with her husband, who is supportive and has a daughter, as well as a grandson to assist her. Pt reports her grandson will help get her to her appointments and is taking her to [facility] to get her port placed today. Pt reports to have concerns about her finances and how she will cover her copays for her cancer care. She reports to have had medicaid in the past prior to getting medicare. She has contacted [facility] DSS to arrange an appointment and is waiting to hear back from them. She plans to call them again this am. She is aware Lendell Caprice will meet with her as well. CSW explained to pt there are several different types of medicaid and the requirements change often. Her best resource to get current medicaid information is DSS.   CSW discussed common emotions when adjusting to cancer diagnosis and coping techniques. Pt is aware of Look Good Feel Better class and plans to attend when this is available. Pt reports she has made the medical team aware of her pain and sleep concerns. CSW encouraged pt to continue talking with medical team about these issues as she starts treatment. Pt reports to have some issues with anxiety and takes xanax for this concern, she feels it is helping currently. Pt denies other concerns currently. CSW educated pt about CSW availability at [facility]. Pt agrees to reach out to CSW as needed.   ONCBCN DISTRESS SCREENING 07/06/2014  Screening Type Initial Screening  Elta Guadeloupe the number that describes how much distress you have been experiencing  in the past week 5  Emotional problem type Adjusting to illness;Adjusting to appearance changes  Physical Problem type Pain;Sleep/insomnia  Physician notified of physical symptoms (No Data)  Referral to clinical social work Yes  Referral to financial advocate Yes    Clinical Social Worker follow up needed: No.  If yes, follow up plan:  Loren Racer, [place] Tuesdays 8:30-1pm Wednesdays 8:30-12pm  [phone]

## 2014-07-11 NOTE — H&P (Signed)
Kristina Rodriguez is an 73 y.o. female.   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 She presents today for port a cath placement for chemotherapy.   Past Medical History  Diagnosis Date  . Diabetes mellitus, type II   . Hypertension   . Hyperlipidemia   . Osteoarthritis   . Iron deficiency anemia     attributed to long-term treatment with a PPI  . Anxiety and depression   . Hepatic steatosis 2006    mild  . Cholelithiasis 2006    Acute and chronic cholecystitis; laparoscopic cholecystectomy in 2006  . 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 Chronic pain   . Collagen vascular disease   . Anxiety   . Uterine cancer 7/15    carcinosarcoma    Past Surgical History  Procedure Laterality Date  . Tonsillectomy    . Carpal tunnel release  1994    Right  . Cholecystectomy, laparoscopic  2006    Dr. Romona Curls; cholelithiasis  . Colonoscopy  2003    Najeeb Rehman;iron deficiency anemia; normal study; hiatal hernia, gastritis, Schatzki's ring on EGD  . Breast biopsy  10/2012    Left; fibroadenoma  . Cholecystectomy    . Tubal ligation  1970s    removed  . Hysteroscopy w/d&c N/A 06/06/2014    Procedure: DILATATION AND CURETTAGE /HYSTEROSCOPY;  Surgeon: Jonnie Kind, MD;  Location: [facility];  Service: Gynecology;  Laterality: N/A;  . Polypectomy N/A 06/06/2014    Procedure: POLYPECTOMY;  Surgeon: Jonnie Kind, MD;  Location: [facility];  Service: Gynecology;  Laterality: N/A;  . Abdominal hysterectomy  06/16/14    robotic hysterectomy, BSO, pelvic and para-aortic lymphadenectomy for uterine carcinosarcoma    Family History  Problem Relation Age of Onset  . Cancer Mother 67    pancreas and liver  . Diabetes Brother   . Congestive Heart Failure Father   .  Hemochromatosis Brother   . Cancer Maternal Aunt   . Cancer Maternal Uncle    Social History:  reports that she has never smoked. She has never used smokeless tobacco. She reports that she does not drink alcohol or use illicit drugs.  Allergies:  Allergies  Allergen Reactions  . Codeine Nausea Only    Current outpatient prescriptions:ALPRAZolam (XANAX) 1 MG tablet, Take 1 mg by mouth 3 (three) times daily as needed for anxiety. , Disp: , Rfl: ;  atorvastatin (LIPITOR) 40 MG tablet, Take 40 mg by mouth daily. , Disp: , Rfl: ;  Docusate Calcium (STOOL SOFTENER PO), Take 1 tablet by mouth at bedtime. , Disp: , Rfl: ;  esomeprazole (NEXIUM) 40 MG capsule, Take 40 mg by mouth daily before breakfast.  , Disp: , Rfl:  fenofibrate (TRICOR) 145 MG tablet, Take 145 mg by mouth daily. , Disp: , Rfl: ;  glipiZIDE (GLUCOTROL) 5 MG tablet, Take 5 mg by mouth 2 (two) times daily before a meal. , Disp: , Rfl: ;  losartan (COZAAR) 100 MG tablet, Take 100 mg by mouth every morning. , Disp: , Rfl: ;  Menthol-Methyl Salicylate (MUSCLE RUB) 10-15 % CREA, Apply 1 application topically as needed for muscle pain (apply to hip)., Disp: , Rfl:  metFORMIN (GLUCOPHAGE) 1000 MG tablet, Take 1,000 mg by mouth 2 (two) times daily with a meal.,  Disp: , Rfl: ;  oxyCODONE-acetaminophen (PERCOCET/ROXICET) 5-325 MG per tablet, Take 1 tablet by mouth every 6 (six) hours as needed for moderate pain. , Disp: , Rfl: ;  pioglitazone (ACTOS) 15 MG tablet, Take 15 mg by mouth daily., Disp: , Rfl: ;  polyethylene glycol (MIRALAX / GLYCOLAX) packet, Take 17 g by mouth daily as needed., Disp: , Rfl:  ibuprofen (ADVIL,MOTRIN) 800 MG tablet, Take 800 mg by mouth every 8 (eight) hours as needed for mild pain or moderate pain. , Disp: , Rfl:  Current facility-administered medications:0.9 %  sodium chloride infusion, , Intravenous, Continuous, Ascencion Dike, PA-C, Last Rate: 50 mL/hr at 07/11/14 1255;  ceFAZolin (ANCEF) IVPB 2 g/50 mL premix, 2 g,  Intravenous, Once, Ascencion Dike, PA-C   Results for orders placed during the hospital encounter of 07/11/14 (from the past 48 hour(s))  APTT     Status: None   Collection Time    07/11/14 12:47 PM      Result Value Ref Range   aPTT 29  24 - 37 seconds  CBC WITH DIFFERENTIAL     Status: None   Collection Time    07/11/14 12:47 PM      Result Value Ref Range   WBC 7.2  4.0 - 10.5 K/uL   RBC 4.14  3.87 - 5.11 MIL/uL   Hemoglobin 12.1  12.0 - 15.0 g/dL   HCT 36.9  36.0 - 46.0 %   MCV 89.1  78.0 - 100.0 fL   MCH 29.2  26.0 - 34.0 pg   MCHC 32.8  30.0 - 36.0 g/dL   RDW 13.4  11.5 - 15.5 %   Platelets 292  150 - 400 K/uL   Neutrophils Relative % 57  43 - 77 %   Neutro Abs 4.1  1.7 - 7.7 K/uL   Lymphocytes Relative 33  12 - 46 %   Lymphs Abs 2.4  0.7 - 4.0 K/uL   Monocytes Relative 7  3 - 12 %   Monocytes Absolute 0.5  0.1 - 1.0 K/uL   Eosinophils Relative 3  0 - 5 %   Eosinophils Absolute 0.2  0.0 - 0.7 K/uL   Basophils Relative 0  0 - 1 %   Basophils Absolute 0.0  0.0 - 0.1 K/uL  PROTIME-INR     Status: None   Collection Time    07/11/14 12:47 PM      Result Value Ref Range   Prothrombin Time 13.5  11.6 - 15.2 seconds   INR 1.03  0.00 - 1.49   No results found.  Review of Systems  Constitutional: Negative for fever and chills.  HENT:       Occ HA's  Respiratory: Negative for cough, hemoptysis and shortness of breath.   Cardiovascular: Negative for chest pain.  Gastrointestinal: Positive for abdominal pain. Negative for nausea, vomiting and blood in stool.  Genitourinary: Negative for hematuria.  Musculoskeletal: Positive for back pain.  Endo/Heme/Allergies: Does not bruise/bleed easily.    Blood pressure 149/64, pulse 87, temperature 98 F (36.7 C), temperature source Oral, resp. rate 16, SpO2 99.00%. Physical Exam  Constitutional: She is oriented to person, place, and time. She appears well-developed and well-nourished.  Cardiovascular: Normal rate and regular  rhythm.   Respiratory: Effort normal and breath sounds normal.  GI: Soft. Bowel sounds are normal. There is no tenderness.  Musculoskeletal: Normal range of motion.  Trace LLE edema  Neurological: She is alert and oriented to person, place, and time.  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 She presents today for port a cath placement for chemotherapy. Details/risks of procedure d/w pt/family with their understanding and consent.  ALLRED,D KEVIN 07/11/2014, 1:14 PM

## 2014-07-11 NOTE — H&P (Signed)
Agree.  For port placement today. 

## 2014-07-12 ENCOUNTER — Inpatient Hospital Stay (HOSPITAL_COMMUNITY): Payer: Medicare HMO

## 2014-07-12 ENCOUNTER — Ambulatory Visit (HOSPITAL_COMMUNITY): Payer: Medicare HMO

## 2014-07-13 ENCOUNTER — Ambulatory Visit (HOSPITAL_COMMUNITY): Payer: Medicare HMO

## 2014-07-13 ENCOUNTER — Encounter (HOSPITAL_COMMUNITY): Payer: Medicare HMO | Attending: Hematology and Oncology

## 2014-07-13 VITALS — BP 126/61 | HR 93 | Temp 98.2°F | Resp 16 | Wt 154.8 lb

## 2014-07-13 DIAGNOSIS — Z90710 Acquired absence of both cervix and uterus: Secondary | ICD-10-CM | POA: Insufficient documentation

## 2014-07-13 DIAGNOSIS — E119 Type 2 diabetes mellitus without complications: Secondary | ICD-10-CM | POA: Insufficient documentation

## 2014-07-13 DIAGNOSIS — M1990 Unspecified osteoarthritis, unspecified site: Secondary | ICD-10-CM | POA: Insufficient documentation

## 2014-07-13 DIAGNOSIS — F411 Generalized anxiety disorder: Secondary | ICD-10-CM | POA: Insufficient documentation

## 2014-07-13 DIAGNOSIS — Z833 Family history of diabetes mellitus: Secondary | ICD-10-CM | POA: Insufficient documentation

## 2014-07-13 DIAGNOSIS — I10 Essential (primary) hypertension: Secondary | ICD-10-CM | POA: Insufficient documentation

## 2014-07-13 DIAGNOSIS — Z5111 Encounter for antineoplastic chemotherapy: Secondary | ICD-10-CM

## 2014-07-13 DIAGNOSIS — G8929 Other chronic pain: Secondary | ICD-10-CM | POA: Insufficient documentation

## 2014-07-13 DIAGNOSIS — C549 Malignant neoplasm of corpus uteri, unspecified: Secondary | ICD-10-CM

## 2014-07-13 DIAGNOSIS — Z9079 Acquired absence of other genital organ(s): Secondary | ICD-10-CM | POA: Insufficient documentation

## 2014-07-13 DIAGNOSIS — D509 Iron deficiency anemia, unspecified: Secondary | ICD-10-CM | POA: Insufficient documentation

## 2014-07-13 DIAGNOSIS — C55 Malignant neoplasm of uterus, part unspecified: Secondary | ICD-10-CM | POA: Insufficient documentation

## 2014-07-13 DIAGNOSIS — Z9089 Acquired absence of other organs: Secondary | ICD-10-CM | POA: Insufficient documentation

## 2014-07-13 DIAGNOSIS — K8020 Calculus of gallbladder without cholecystitis without obstruction: Secondary | ICD-10-CM | POA: Insufficient documentation

## 2014-07-13 DIAGNOSIS — K9089 Other intestinal malabsorption: Secondary | ICD-10-CM | POA: Insufficient documentation

## 2014-07-13 DIAGNOSIS — K219 Gastro-esophageal reflux disease without esophagitis: Secondary | ICD-10-CM | POA: Insufficient documentation

## 2014-07-13 DIAGNOSIS — Z885 Allergy status to narcotic agent status: Secondary | ICD-10-CM | POA: Insufficient documentation

## 2014-07-13 DIAGNOSIS — D249 Benign neoplasm of unspecified breast: Secondary | ICD-10-CM | POA: Insufficient documentation

## 2014-07-13 DIAGNOSIS — E785 Hyperlipidemia, unspecified: Secondary | ICD-10-CM | POA: Insufficient documentation

## 2014-07-13 DIAGNOSIS — Z809 Family history of malignant neoplasm, unspecified: Secondary | ICD-10-CM | POA: Insufficient documentation

## 2014-07-13 DIAGNOSIS — I999 Unspecified disorder of circulatory system: Secondary | ICD-10-CM | POA: Insufficient documentation

## 2014-07-13 DIAGNOSIS — F329 Major depressive disorder, single episode, unspecified: Secondary | ICD-10-CM | POA: Insufficient documentation

## 2014-07-13 DIAGNOSIS — Z79899 Other long term (current) drug therapy: Secondary | ICD-10-CM | POA: Insufficient documentation

## 2014-07-13 DIAGNOSIS — K7689 Other specified diseases of liver: Secondary | ICD-10-CM | POA: Insufficient documentation

## 2014-07-13 DIAGNOSIS — F3289 Other specified depressive episodes: Secondary | ICD-10-CM | POA: Insufficient documentation

## 2014-07-13 MED ORDER — FOSAPREPITANT DIMEGLUMINE INJECTION 150 MG: 150.0000 mg | Freq: Once | INTRAVENOUS | Status: DC

## 2014-07-13 MED ORDER — FAMOTIDINE IN NACL 20-0.9 MG/50ML-% IV SOLN
20.0000 mg | Freq: Once | INTRAVENOUS | Status: AC
  Administered 2014-07-13: 20 mg via INTRAVENOUS

## 2014-07-13 MED ORDER — FAMOTIDINE IN NACL 20-0.9 MG/50ML-% IV SOLN
INTRAVENOUS | Status: AC
  Filled 2014-07-13: qty 50

## 2014-07-13 MED ORDER — SODIUM CHLORIDE 0.9 % IV SOLN
Freq: Once | INTRAVENOUS | Status: AC
  Administered 2014-07-13: 10:00:00 via INTRAVENOUS

## 2014-07-13 MED ORDER — DIPHENHYDRAMINE HCL 50 MG/ML IJ SOLN
INTRAMUSCULAR | Status: AC
  Filled 2014-07-13: qty 1

## 2014-07-13 MED ORDER — SODIUM CHLORIDE 0.9 % IV SOLN
481.8000 mg | Freq: Once | INTRAVENOUS | Status: AC
  Administered 2014-07-13: 480 mg via INTRAVENOUS
  Filled 2014-07-13: qty 48

## 2014-07-13 MED ORDER — HEPARIN SOD (PORK) LOCK FLUSH 100 UNIT/ML IV SOLN
500.0000 [IU] | Freq: Once | INTRAVENOUS | Status: AC | PRN
  Administered 2014-07-13: 500 [IU]
  Filled 2014-07-13: qty 5

## 2014-07-13 MED ORDER — PACLITAXEL CHEMO INJECTION 300 MG/50ML
175.0000 mg/m2 | Freq: Once | INTRAVENOUS | Status: AC
  Administered 2014-07-13: 312 mg via INTRAVENOUS
  Filled 2014-07-13: qty 52

## 2014-07-13 MED ORDER — DIPHENHYDRAMINE HCL 50 MG/ML IJ SOLN
50.0000 mg | Freq: Once | INTRAMUSCULAR | Status: AC
Start: 2014-07-13 — End: 2014-07-13
  Administered 2014-07-13: 50 mg via INTRAVENOUS

## 2014-07-13 MED ORDER — DEXAMETHASONE SODIUM PHOSPHATE 10 MG/ML IJ SOLN: 20.0000 mg | Freq: Once | INTRAMUSCULAR | Status: DC

## 2014-07-13 MED ORDER — SODIUM CHLORIDE 0.9 % IV SOLN
Freq: Once | INTRAVENOUS | Status: AC
Start: 2014-07-13 — End: 2014-07-13
  Administered 2014-07-13: 10:00:00 via INTRAVENOUS
  Filled 2014-07-13: qty 5

## 2014-07-13 MED ORDER — SODIUM CHLORIDE 0.9 % IJ SOLN
10.0000 mL | INTRAMUSCULAR | Status: DC | PRN
  Administered 2014-07-13: 10 mL

## 2014-07-13 MED ORDER — ONDANSETRON HCL 40 MG/20ML IJ SOLN
16.0000 mg | Freq: Once | INTRAMUSCULAR | Status: AC
  Administered 2014-07-13: 16 mg via INTRAVENOUS
  Filled 2014-07-13: qty 8

## 2014-07-13 NOTE — Patient Instructions (Signed)
[facility] Discharge Instructions for Patients Receiving Chemotherapy  Today you received the following chemotherapy agents Taxol and Carboplatin. Return to clinic tomorrow as scheduled. You do not need to see the doctor tomorrow, so disregard that appointment. You will receive the Neulasta injection tomorrow. Follow up appointment with doctor next week as scheduled. Report any issues/concerns to clinic as needed prior to appointments. To help prevent nausea and vomiting after your treatment, we encourage you to take your nausea medication as instructed.   If you develop nausea and vomiting that is not controlled by your nausea medication, call the clinic. If it is after clinic hours your family physician or the after hours number for the clinic or go to the Emergency Department.   BELOW ARE SYMPTOMS THAT SHOULD BE REPORTED IMMEDIATELY:  *FEVER GREATER THAN 101.0 F  *CHILLS WITH OR WITHOUT FEVER  NAUSEA AND VOMITING THAT IS NOT CONTROLLED WITH YOUR NAUSEA MEDICATION  *UNUSUAL SHORTNESS OF BREATH  *UNUSUAL BRUISING OR BLEEDING  TENDERNESS IN MOUTH AND THROAT WITH OR WITHOUT PRESENCE OF ULCERS  *URINARY PROBLEMS  *BOWEL PROBLEMS  UNUSUAL RASH Items with * indicate a potential emergency and should be followed up as soon as possible.  One of the nurses will contact you 24 hours after your treatment. Please let the nurse know about any problems that you may have experienced. Feel free to call the clinic you have any questions or concerns. The clinic phone number is [phone].   I have been informed and understand all the instructions given to me. I know to contact the clinic, my physician, or go to the Emergency Department if any problems should occur. I do not have any questions at this time, but understand that I may call the clinic during office hours or the Patient Navigator at [phone] should I have any questions or need assistance in obtaining follow up  care.    __________________________________________  _____________  __________ Signature of Patient or Authorized Representative            Date                   Time    __________________________________________ Nurse's Signature

## 2014-07-13 NOTE — Progress Notes (Signed)
Tolerated chemotherapy well. No complaints voiced at discharge.

## 2014-07-14 ENCOUNTER — Ambulatory Visit (HOSPITAL_COMMUNITY): Payer: Medicare HMO

## 2014-07-14 ENCOUNTER — Encounter (HOSPITAL_BASED_OUTPATIENT_CLINIC_OR_DEPARTMENT_OTHER): Payer: Medicare HMO

## 2014-07-14 VITALS — BP 120/53 | HR 92 | Temp 97.9°F | Resp 18

## 2014-07-14 DIAGNOSIS — Z5189 Encounter for other specified aftercare: Secondary | ICD-10-CM

## 2014-07-14 DIAGNOSIS — C549 Malignant neoplasm of corpus uteri, unspecified: Secondary | ICD-10-CM

## 2014-07-14 MED ORDER — PEGFILGRASTIM INJECTION 6 MG/0.6ML
SUBCUTANEOUS | Status: AC
  Filled 2014-07-14: qty 0.6

## 2014-07-14 MED ORDER — PEGFILGRASTIM INJECTION 6 MG/0.6ML
6.0000 mg | Freq: Once | SUBCUTANEOUS | Status: AC
  Administered 2014-07-14: 6 mg via SUBCUTANEOUS

## 2014-07-14 NOTE — Progress Notes (Signed)
Kristina Rodriguez presents today for injection per the provider's orders.  Neulasta administration without incident; see MAR for injection details.  Patient tolerated procedure well and without incident.  No questions or complaints noted at this time. Denies any complaints after having initial chemotherapy treatment yesterday, 07/13/14.

## 2014-07-18 ENCOUNTER — Telehealth (HOSPITAL_COMMUNITY): Payer: Self-pay

## 2014-07-18 ENCOUNTER — Other Ambulatory Visit (HOSPITAL_COMMUNITY): Payer: Self-pay | Admitting: Hematology and Oncology

## 2014-07-18 MED ORDER — OXYCODONE HCL 5 MG PO TABS: ORAL_TABLET | ORAL | Status: DC

## 2014-07-18 NOTE — Telephone Encounter (Signed)
Patient notified and husband will come to pick up the prescription.  Encourage to increase fluid intake.  Has appointment to be seen tomorrow in follow-up after chemotherapy.

## 2014-07-18 NOTE — Telephone Encounter (Signed)
Message copied by Mellissa Kohut on Tue Jul 18, 2014 10:25 AM ------      Message from: Farrel Gobble A      Created: Tue Jul 18, 2014 10:11 AM       Have ordered more oxycodone. However, it will the diarrhea as well. ------

## 2014-07-18 NOTE — Telephone Encounter (Signed)
Call from patient with complaints of severe bone pain since receiving Neulasta on Friday.  States "every bone in my body hurts, even my skin hurts.  I took the Ibuprofen 800mg  and it did not touch the pain at all.  The oxycodone helped but I didn't have enough to take it on a regular basis and only have 2 pills left.  I've been having diarrhea since yesterday.  Went 3 times yesterday and have been on the commode all morning.  I've never been in such a shape and I don't know what to do.  My husband has gone to get me some imodium now."

## 2014-07-19 ENCOUNTER — Encounter (HOSPITAL_COMMUNITY): Payer: Self-pay

## 2014-07-19 ENCOUNTER — Encounter (HOSPITAL_BASED_OUTPATIENT_CLINIC_OR_DEPARTMENT_OTHER): Payer: Medicare HMO

## 2014-07-19 ENCOUNTER — Ambulatory Visit (HOSPITAL_COMMUNITY): Payer: Medicare HMO

## 2014-07-19 VITALS — BP 149/55 | HR 106 | Temp 98.7°F | Resp 20 | Wt 149.0 lb

## 2014-07-19 DIAGNOSIS — Z809 Family history of malignant neoplasm, unspecified: Secondary | ICD-10-CM | POA: Diagnosis not present

## 2014-07-19 DIAGNOSIS — Z885 Allergy status to narcotic agent status: Secondary | ICD-10-CM | POA: Diagnosis not present

## 2014-07-19 DIAGNOSIS — E785 Hyperlipidemia, unspecified: Secondary | ICD-10-CM | POA: Diagnosis not present

## 2014-07-19 DIAGNOSIS — Z79899 Other long term (current) drug therapy: Secondary | ICD-10-CM | POA: Diagnosis not present

## 2014-07-19 DIAGNOSIS — K9089 Other intestinal malabsorption: Secondary | ICD-10-CM | POA: Diagnosis not present

## 2014-07-19 DIAGNOSIS — I999 Unspecified disorder of circulatory system: Secondary | ICD-10-CM | POA: Diagnosis not present

## 2014-07-19 DIAGNOSIS — K8020 Calculus of gallbladder without cholecystitis without obstruction: Secondary | ICD-10-CM | POA: Diagnosis not present

## 2014-07-19 DIAGNOSIS — Z9079 Acquired absence of other genital organ(s): Secondary | ICD-10-CM | POA: Diagnosis not present

## 2014-07-19 DIAGNOSIS — C55 Malignant neoplasm of uterus, part unspecified: Secondary | ICD-10-CM | POA: Diagnosis not present

## 2014-07-19 DIAGNOSIS — F3289 Other specified depressive episodes: Secondary | ICD-10-CM | POA: Diagnosis not present

## 2014-07-19 DIAGNOSIS — I10 Essential (primary) hypertension: Secondary | ICD-10-CM | POA: Diagnosis not present

## 2014-07-19 DIAGNOSIS — K219 Gastro-esophageal reflux disease without esophagitis: Secondary | ICD-10-CM | POA: Diagnosis not present

## 2014-07-19 DIAGNOSIS — K909 Intestinal malabsorption, unspecified: Secondary | ICD-10-CM

## 2014-07-19 DIAGNOSIS — D509 Iron deficiency anemia, unspecified: Secondary | ICD-10-CM

## 2014-07-19 DIAGNOSIS — F411 Generalized anxiety disorder: Secondary | ICD-10-CM | POA: Diagnosis not present

## 2014-07-19 DIAGNOSIS — Z833 Family history of diabetes mellitus: Secondary | ICD-10-CM | POA: Diagnosis not present

## 2014-07-19 DIAGNOSIS — K7689 Other specified diseases of liver: Secondary | ICD-10-CM | POA: Diagnosis not present

## 2014-07-19 DIAGNOSIS — E119 Type 2 diabetes mellitus without complications: Secondary | ICD-10-CM | POA: Diagnosis not present

## 2014-07-19 DIAGNOSIS — D249 Benign neoplasm of unspecified breast: Secondary | ICD-10-CM | POA: Diagnosis not present

## 2014-07-19 DIAGNOSIS — M1990 Unspecified osteoarthritis, unspecified site: Secondary | ICD-10-CM | POA: Diagnosis not present

## 2014-07-19 DIAGNOSIS — Z9089 Acquired absence of other organs: Secondary | ICD-10-CM | POA: Diagnosis not present

## 2014-07-19 DIAGNOSIS — G8929 Other chronic pain: Secondary | ICD-10-CM | POA: Diagnosis not present

## 2014-07-19 DIAGNOSIS — F329 Major depressive disorder, single episode, unspecified: Secondary | ICD-10-CM | POA: Diagnosis not present

## 2014-07-19 DIAGNOSIS — Z90710 Acquired absence of both cervix and uterus: Secondary | ICD-10-CM | POA: Diagnosis not present

## 2014-07-19 LAB — CBC WITH DIFFERENTIAL/PLATELET
Basophils Absolute: 0 10*3/uL (ref 0.0–0.1)
Basophils Relative: 0 % (ref 0–1)
EOS PCT: 5 % (ref 0–5)
Eosinophils Absolute: 0.2 10*3/uL (ref 0.0–0.7)
HEMATOCRIT: 36.6 % (ref 36.0–46.0)
HEMOGLOBIN: 12.6 g/dL (ref 12.0–15.0)
LYMPHS ABS: 1 10*3/uL (ref 0.7–4.0)
LYMPHS PCT: 20 % (ref 12–46)
MCH: 29.5 pg (ref 26.0–34.0)
MCHC: 34.4 g/dL (ref 30.0–36.0)
MCV: 85.7 fL (ref 78.0–100.0)
MONO ABS: 0.4 10*3/uL (ref 0.1–1.0)
MONOS PCT: 8 % (ref 3–12)
Neutro Abs: 3.4 10*3/uL (ref 1.7–7.7)
Neutrophils Relative %: 67 % (ref 43–77)
Platelets: 155 10*3/uL (ref 150–400)
RBC: 4.27 MIL/uL (ref 3.87–5.11)
RDW: 13.4 % (ref 11.5–15.5)
WBC: 5 10*3/uL (ref 4.0–10.5)

## 2014-07-19 NOTE — Progress Notes (Signed)
[place]  OFFICE PROGRESS Kristina Miner, MD [address] [place] [address]  DIAGNOSIS: Uterine carcinosarcoma - Plan: CBC with Differential, CBC with Differential  Iron deficiency anemia  Malabsorption of iron  Chief Complaint  Patient presents with  . Carcinosarcoma of the uterus    CURRENT THERAPY: Carboplatin, Taxol with Neulasta support begun on 07/13/2014.  INTERVAL HISTORY: Kristina Rodriguez 73 y.o. female returns for followup one week after initiation of adjuvant chemotherapy with carboplatin/Taxol with Neulasta support with first dose given on 07/13/2014 for carcinosarcoma of the uterus, stage III. On the evening of the day she received Neulasta, she developed diffuse body aches not relieved by ibuprofen. Yesterday she was given prescription for oxycodone with better relief now with a pain intensity score of 6/10. She was 10 over 10. She's also developed peripheral paresthesias involving both upper extremities particularly the fingertips. She is unable to sleep because of pain. She's also had diarrhea which was relieved by Imodium and has not had a loose stool since yesterday. She denies any lower extremity swelling or redness, I, wheezing, PND, orthopnea, palpitations, skin rash, headache, or seizures.  MEDICAL HISTORY: Past Medical History  Diagnosis Date  . Diabetes mellitus, type II   . Hypertension   . Hyperlipidemia   . Osteoarthritis   . Iron deficiency anemia     attributed to long-term treatment with a PPI  . Anxiety and depression   . Hepatic steatosis 2006    mild  . Cholelithiasis 2006    Acute and chronic cholecystitis; laparoscopic cholecystectomy in 2006  . 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 Chronic pain   . Collagen vascular disease   . Anxiety   . Uterine cancer 7/15    carcinosarcoma    INTERIM HISTORY: has Diabetes mellitus, type II;  Hypertension; Hyperlipidemia; Osteoarthritis; Iron deficiency anemia; Other chronic pain; Depressive disorder, not elsewhere classified; Screening for malignant neoplasm of the cervix; Uterine mass; and Uterine endometrial cancer, sarcoma on her problem list.    ALLERGIES:  is allergic to codeine.  MEDICATIONS: has a current medication list which includes the following prescription(s): alprazolam, atorvastatin, docusate calcium, esomeprazole, fenofibrate, glipizide, ibuprofen, loperamide, losartan, muscle rub, metformin, oxycodone, oxycodone-acetaminophen, pioglitazone, and polyethylene glycol.  SURGICAL HISTORY:  Past Surgical History  Procedure Laterality Date  . Tonsillectomy    . Carpal tunnel release  1994    Right  . Cholecystectomy, laparoscopic  2006    Dr. Romona Curls; cholelithiasis  . Colonoscopy  2003    Najeeb Rehman;iron deficiency anemia; normal study; hiatal hernia, gastritis, Schatzki's ring on EGD  . Breast biopsy  10/2012    Left; fibroadenoma  . Cholecystectomy    . Tubal ligation  1970s    removed  . Hysteroscopy w/d&c N/A 06/06/2014    Procedure: DILATATION AND CURETTAGE /HYSTEROSCOPY;  Surgeon: Jonnie Kind, MD;  Location: [facility];  Service: Gynecology;  Laterality: N/A;  . Polypectomy N/A 06/06/2014    Procedure: POLYPECTOMY;  Surgeon: Jonnie Kind, MD;  Location: [facility];  Service: Gynecology;  Laterality: N/A;  . Abdominal hysterectomy  06/16/14    robotic hysterectomy, BSO, pelvic and para-aortic lymphadenectomy for uterine carcinosarcoma    FAMILY HISTORY: family history includes Cancer in her maternal aunt and maternal uncle; Cancer (age of onset: 59) in her mother; Congestive Heart Failure in her father; Diabetes in her brother;  Hemochromatosis in her brother.  SOCIAL HISTORY:  reports that she has never smoked. She has never used smokeless tobacco. She reports that she does not drink alcohol or use illicit drugs.  REVIEW OF SYSTEMS:  Other than that  discussed above is noncontributory.  PHYSICAL EXAMINATION: ECOG PERFORMANCE STATUS: 1 - Symptomatic but completely ambulatory  Blood pressure 149/55, pulse 106, temperature 98.7 F (37.1 C), temperature source Oral, resp. rate 20, weight 149 lb (67.586 kg), SpO2 98.00%.  GENERAL:alert, no distress and comfortable SKIN: skin color, texture, turgor are normal, no rashes or significant lesions EYES: PERLA; Conjunctiva are pink and non-injected, sclera clear SINUSES: No redness or tenderness over maxillary or ethmoid sinuses OROPHARYNX:no exudate, no erythema on lips, buccal mucosa, or tongue. NECK: supple, thyroid normal size, non-tender, without nodularity. No masses CHEST: No breast masses. LifePort in place. LYMPH:  no palpable lymphadenopathy in the cervical, axillary or inguinal LUNGS: clear to auscultation and percussion with normal breathing effort HEART: regular rate & rhythm and no murmurs. ABDOMEN:abdomen soft, non-tender and normal bowel sounds. Surgical wounds well healed. MUSCULOSKELETAL:no cyanosis of digits and no clubbing. Range of motion normal.  NEURO: alert & oriented x 3 with fluent speech, no focal motor/sensory deficits. Diminished deep tendon reflexes.   LABORATORY DATA: Office Visit on 07/19/2014  Component Date Value Ref Range Status  . WBC 07/19/2014 5.0  4.0 - 10.5 K/uL Final  . RBC 07/19/2014 4.27  3.87 - 5.11 MIL/uL Final  . Hemoglobin 07/19/2014 12.6  12.0 - 15.0 g/dL Final  . HCT 07/19/2014 36.6  36.0 - 46.0 % Final  . MCV 07/19/2014 85.7  78.0 - 100.0 fL Final  . MCH 07/19/2014 29.5  26.0 - 34.0 pg Final  . MCHC 07/19/2014 34.4  30.0 - 36.0 g/dL Final  . RDW 07/19/2014 13.4  11.5 - 15.5 % Final  . Platelets 07/19/2014 155  150 - 400 K/uL Final  . Neutrophils Relative % 07/19/2014 67  43 - 77 % Final  . Neutro Abs 07/19/2014 3.4  1.7 - 7.7 K/uL Final  . Lymphocytes Relative 07/19/2014 20  12 - 46 % Final  . Lymphs Abs 07/19/2014 1.0  0.7 - 4.0 K/uL  Final  . Monocytes Relative 07/19/2014 8  3 - 12 % Final  . Monocytes Absolute 07/19/2014 0.4  0.1 - 1.0 K/uL Final  . Eosinophils Relative 07/19/2014 5  0 - 5 % Final  . Eosinophils Absolute 07/19/2014 0.2  0.0 - 0.7 K/uL Final  . Basophils Relative 07/19/2014 0  0 - 1 % Final  . Basophils Absolute 07/19/2014 0.0  0.0 - 0.1 K/uL Final  Hospital Outpatient Visit on 07/11/2014  Component Date Value Ref Range Status  . Glucose-Capillary 07/11/2014 153* 70 - 99 mg/dL Final  Hospital Outpatient Visit on 07/11/2014  Component Date Value Ref Range Status  . aPTT 07/11/2014 29  24 - 37 seconds Final  . WBC 07/11/2014 7.2  4.0 - 10.5 K/uL Final  . RBC 07/11/2014 4.14  3.87 - 5.11 MIL/uL Final  . Hemoglobin 07/11/2014 12.1  12.0 - 15.0 g/dL Final  . HCT 07/11/2014 36.9  36.0 - 46.0 % Final  . MCV 07/11/2014 89.1  78.0 - 100.0 fL Final  . MCH 07/11/2014 29.2  26.0 - 34.0 pg Final  . MCHC 07/11/2014 32.8  30.0 - 36.0 g/dL Final  . RDW 07/11/2014 13.4  11.5 - 15.5 % Final  . Platelets 07/11/2014 292  150 - 400 K/uL Final  . Neutrophils Relative %  07/11/2014 57  43 - 77 % Final  . Neutro Abs 07/11/2014 4.1  1.7 - 7.7 K/uL Final  . Lymphocytes Relative 07/11/2014 33  12 - 46 % Final  . Lymphs Abs 07/11/2014 2.4  0.7 - 4.0 K/uL Final  . Monocytes Relative 07/11/2014 7  3 - 12 % Final  . Monocytes Absolute 07/11/2014 0.5  0.1 - 1.0 K/uL Final  . Eosinophils Relative 07/11/2014 3  0 - 5 % Final  . Eosinophils Absolute 07/11/2014 0.2  0.0 - 0.7 K/uL Final  . Basophils Relative 07/11/2014 0  0 - 1 % Final  . Basophils Absolute 07/11/2014 0.0  0.0 - 0.1 K/uL Final  . Prothrombin Time 07/11/2014 13.5  11.6 - 15.2 seconds Final  . INR 07/11/2014 1.03  0.00 - 1.49 Final  Lab on 07/04/2014  Component Date Value Ref Range Status  . WBC 07/04/2014 7.4  4.0 - 10.5 K/uL Final  . RBC 07/04/2014 3.98  3.87 - 5.11 MIL/uL Final  . Hemoglobin 07/04/2014 11.6* 12.0 - 15.0 g/dL Final  . HCT 07/04/2014 35.7* 36.0  - 46.0 % Final  . MCV 07/04/2014 89.7  78.0 - 100.0 fL Final  . MCH 07/04/2014 29.1  26.0 - 34.0 pg Final  . MCHC 07/04/2014 32.5  30.0 - 36.0 g/dL Final  . RDW 07/04/2014 13.2  11.5 - 15.5 % Final  . Platelets 07/04/2014 422* 150 - 400 K/uL Final  . Neutrophils Relative % 07/04/2014 62  43 - 77 % Final  . Neutro Abs 07/04/2014 4.6  1.7 - 7.7 K/uL Final  . Lymphocytes Relative 07/04/2014 28  12 - 46 % Final  . Lymphs Abs 07/04/2014 2.1  0.7 - 4.0 K/uL Final  . Monocytes Relative 07/04/2014 6  3 - 12 % Final  . Monocytes Absolute 07/04/2014 0.4  0.1 - 1.0 K/uL Final  . Eosinophils Relative 07/04/2014 3  0 - 5 % Final  . Eosinophils Absolute 07/04/2014 0.2  0.0 - 0.7 K/uL Final  . Basophils Relative 07/04/2014 1  0 - 1 % Final  . Basophils Absolute 07/04/2014 0.1  0.0 - 0.1 K/uL Final  . Sodium 07/04/2014 138  137 - 147 mEq/L Final  . Potassium 07/04/2014 4.6  3.7 - 5.3 mEq/L Final  . Chloride 07/04/2014 98  96 - 112 mEq/L Final  . CO2 07/04/2014 26  19 - 32 mEq/L Final  . Glucose, Bld 07/04/2014 169* 70 - 99 mg/dL Final  . BUN 07/04/2014 18  6 - 23 mg/dL Final  . Creatinine, Ser 07/04/2014 1.01  0.50 - 1.10 mg/dL Final  . Calcium 07/04/2014 9.6  8.4 - 10.5 mg/dL Final  . Total Protein 07/04/2014 7.3  6.0 - 8.3 g/dL Final  . Albumin 07/04/2014 3.8  3.5 - 5.2 g/dL Final  . AST 07/04/2014 17  0 - 37 U/L Final  . ALT 07/04/2014 10  0 - 35 U/L Final  . Alkaline Phosphatase 07/04/2014 51  39 - 117 U/L Final  . Total Bilirubin 07/04/2014 0.4  0.3 - 1.2 mg/dL Final  . GFR calc non Af Amer 07/04/2014 54* >90 mL/min Final  . GFR calc Af Amer 07/04/2014 62* >90 mL/min Final   Comment: (NOTE)                          The eGFR has been calculated using the CKD EPI equation.  This calculation has not been validated in all clinical situations.                          eGFR's persistently <90 mL/min signify possible Chronic Kidney                          Disease.  . Anion  gap 07/04/2014 14  5 - 15 Final  . Ferritin 07/04/2014 344* 10 - 291 ng/mL Final   Performed at [facility]: Carcinosarcoma  Urinalysis    Component Value Date/Time   COLORURINE YELLOW 06/01/2014 1130   APPEARANCEUR CLEAR 06/01/2014 1130   LABSPEC <1.005* 06/01/2014 1130   PHURINE 7.0 06/01/2014 1130   GLUCOSEU 100* 06/01/2014 1130   HGBUR NEGATIVE 06/01/2014 1130   BILIRUBINUR NEGATIVE 06/01/2014 1130   KETONESUR NEGATIVE 06/01/2014 1130   PROTEINUR NEGATIVE 06/01/2014 1130   UROBILINOGEN 0.2 06/01/2014 1130   NITRITE NEGATIVE 06/01/2014 1130   LEUKOCYTESUR NEGATIVE 06/01/2014 1130    RADIOGRAPHIC STUDIES: Ir Fluoro Guide Cv Line Right  07/11/2014   CLINICAL DATA:  73 year old female with a history of carcinosarcoma of the uterus, referred for port catheter placement.  EXAM: IR RIGHT FLOURO GUIDE CV LINE; IR ULTRASOUND GUIDANCE VASC ACCESS RIGHT  Date: 07/11/2014  ANESTHESIA/SEDATION: Moderate (conscious) sedation was administered during this procedure. A total of 2 mg Versed and 200 mg Fentanyl were administered intravenously. The patient's vital signs were monitored continuously by radiology nursing throughout the course of the procedure.  Total sedation time: 41 minutes  FLUOROSCOPY TIME:  Zero minutes, 42 seconds  TECHNIQUE: The right neck and chest was prepped with chlorhexidine, and draped in the usual sterile fashion using maximum barrier technique (cap and mask, sterile gown, sterile gloves, large sterile sheet, hand hygiene and cutaneous antiseptic). Antibiotic prophylaxis was provided with 2g Ancef administered IV 1 hr prior to skin incision. Local anesthesia was attained by infiltration with 2% lidocaine.  Ultrasound demonstrated patency of the right internal jugular vein, and this was documented with an image. Under real-time ultrasound guidance, this vein was accessed with a 21 gauge micropuncture needle and image documentation was performed. A small dermatotomy was  made at the access site with an 11 scalpel. A 0.018" wire was advanced into the SVC and the access needle exchanged for a 67F micropuncture vascular sheath. The 0.018" wire was then removed and a 0.035" wire advanced into the IVC.  An appropriate location for the subcutaneous reservoir was selected below the clavicle and an incision was made through the skin and underlying soft tissues. The subcutaneous tissues were then dissected using a combination of blunt and sharp surgical technique and a pocket was formed. A single lumen power injectable portacatheter was then tunneled through the subcutaneous tissues from the pocket to the dermatotomy and the port reservoir placed within the subcutaneous pocket.  The venous access site was then serially dilated and a peel away vascular sheath placed over the wire. The wire was removed and the port catheter advanced into position under fluoroscopic guidance. The catheter tip is positioned at the cavoatrial junction. This was documented with a spot image. The portacatheter was then tested and found to flush and aspirate well. The port was flushed with saline followed by 100 units/mL heparinized saline.  The pocket was then closed in two layers using first subdermal inverted interrupted absorbable sutures followed by a running subcuticular suture. The epidermis  was then sealed with Dermabond. The dermatotomy at the venous access site was also closed with a single inverted subdermal suture and the epidermis sealed with Dermabond.  COMPLICATIONS: None.  The patient tolerated the procedure well.  IMPRESSION: Status post placement of a right-sided IJ single-lumen power-injectable port catheter with ultrasound and fluoroscopic guidance. The catheter is ready for use.   Electronically Signed   By: Corrie Mckusick O.D.   On: 07/11/2014 17:12   Ir US Guide Vasc Access Right  07/11/2014   CLINICAL DATA:  73 year old female with a history of carcinosarcoma of the uterus, referred for port  catheter placement.  EXAM: IR RIGHT FLOURO GUIDE CV LINE; IR ULTRASOUND GUIDANCE VASC ACCESS RIGHT  Date: 07/11/2014  ANESTHESIA/SEDATION: Moderate (conscious) sedation was administered during this procedure. A total of 2 mg Versed and 200 mg Fentanyl were administered intravenously. The patient's vital signs were monitored continuously by radiology nursing throughout the course of the procedure.  Total sedation time: 41 minutes  FLUOROSCOPY TIME:  Zero minutes, 42 seconds  TECHNIQUE: The right neck and chest was prepped with chlorhexidine, and draped in the usual sterile fashion using maximum barrier technique (cap and mask, sterile gown, sterile gloves, large sterile sheet, hand hygiene and cutaneous antiseptic). Antibiotic prophylaxis was provided with 2g Ancef administered IV 1 hr prior to skin incision. Local anesthesia was attained by infiltration with 2% lidocaine.  Ultrasound demonstrated patency of the right internal jugular vein, and this was documented with an image. Under real-time ultrasound guidance, this vein was accessed with a 21 gauge micropuncture needle and image documentation was performed. A small dermatotomy was made at the access site with an 11 scalpel. A 0.018" wire was advanced into the SVC and the access needle exchanged for a 59F micropuncture vascular sheath. The 0.018" wire was then removed and a 0.035" wire advanced into the IVC.  An appropriate location for the subcutaneous reservoir was selected below the clavicle and an incision was made through the skin and underlying soft tissues. The subcutaneous tissues were then dissected using a combination of blunt and sharp surgical technique and a pocket was formed. A single lumen power injectable portacatheter was then tunneled through the subcutaneous tissues from the pocket to the dermatotomy and the port reservoir placed within the subcutaneous pocket.  The venous access site was then serially dilated and a peel away vascular sheath  placed over the wire. The wire was removed and the port catheter advanced into position under fluoroscopic guidance. The catheter tip is positioned at the cavoatrial junction. This was documented with a spot image. The portacatheter was then tested and found to flush and aspirate well. The port was flushed with saline followed by 100 units/mL heparinized saline.  The pocket was then closed in two layers using first subdermal inverted interrupted absorbable sutures followed by a running subcuticular suture. The epidermis was then sealed with Dermabond. The dermatotomy at the venous access site was also closed with a single inverted subdermal suture and the epidermis sealed with Dermabond.  COMPLICATIONS: None.  The patient tolerated the procedure well.  IMPRESSION: Status post placement of a right-sided IJ single-lumen power-injectable port catheter with ultrasound and fluoroscopic guidance. The catheter is ready for use.   Electronically Signed   By: Corrie Mckusick O.D.   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  #2. Iron deficiency  anemia secondary to malabsorption, stable.  #3. Gastroesophageal reflux disease on long-term proton pump inhibitor therapy.  #4. Diabetes mellitus, type II, non-insulin requiring, controlled.  #5. Hypertension, controlled.  #6. Hyperlipidemia, on treatment.  #7. Severe bone pain secondary to Neulasta. #8. 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      PLAN:  #1. Continue oxycodone 2 tablets every 4 hours with 3 at bedtime. #2. Do not take any further Imodium. #3. Instead of using Neulasta, will give prophylactic antibiotics after each chemotherapy treatment. #4. Return in 2 weeks to begin cycle 2 of treatment at which time Taxol will be given on day 1 and 8 and 75 mg per meter squared with carboplatin given AUC of  6 on day 1. He cycle will be extended to 28 days in an effort to deliver the drug but with less toxicity. Plan is to give 3 cycles of chemotherapy per recommendations of radiation oncology prior to initiation of pelvic treatment with radiation.   All questions were answered. The patient knows to call the clinic with any problems, questions or concerns. We can certainly see the patient much sooner if necessary.   I spent 30 minutes counseling the patient face to face. The total time spent in the appointment was 40 minutes.    Doroteo Bradford, MD 07/19/2014 7:05 PM  DISCLAIMER:  This note was dictated with voice recognition software.  Similar sounding words can inadvertently be transcribed inaccurately and may not be corrected upon review.

## 2014-07-19 NOTE — Patient Instructions (Signed)
[facility] Discharge Instructions  RECOMMENDATIONS MADE BY THE CONSULTANT AND ANY TEST RESULTS WILL BE SENT TO YOUR REFERRING PHYSICIAN.  Return in 2 weeks for treatment. Take oxycodone 3 at bedtime for pain control to help you rest.   Thank you for choosing [facility] to provide your oncology and hematology care.  To afford each patient quality time with our providers, please arrive at least 15 minutes before your scheduled appointment time.  With your help, our goal is to use those 15 minutes to complete the necessary work-up to ensure our physicians have the information they need to help with your evaluation and healthcare recommendations.    Effective January 1st, 2014, we ask that you re-schedule your appointment with our physicians should you arrive 10 or more minutes late for your appointment.  We strive to give you quality time with our providers, and arriving late affects you and other patients whose appointments are after yours.    Again, thank you for choosing [facility].  Our hope is that these requests will decrease the amount of time that you wait before being seen by our physicians.       _____________________________________________________________  Should you have questions after your visit to [facility], please contact our office at [phone] between the hours of 8:30 a.m. and 4:30 p.m.  Voicemails left after 4:30 p.m. will not be returned until the following business day.  For prescription refill requests, have your pharmacy contact our office with your prescription refill request.    _______________________________________________________________  We hope that we have given you very good care.  You may receive a patient satisfaction survey in the mail, please complete it and return it as soon as possible.  We value your feedback!  _______________________________________________________________  Have you  asked about our STAR program?  STAR stands for Survivorship Training and Rehabilitation, and this is a nationally recognized cancer care program that focuses on survivorship and rehabilitation.  Cancer and cancer treatments may cause problems, such as, pain, making you feel tired and keeping you from doing the things that you need or want to do. Cancer rehabilitation can help. Our goal is to reduce these troubling effects and help you have the best quality of life possible.  You may receive a survey from a nurse that asks questions about your current state of health.  Based on the survey results, all eligible patients will be referred to the [facility] program for an evaluation so we can better serve you!  A frequently asked questions sheet is available upon request.

## 2014-07-20 ENCOUNTER — Other Ambulatory Visit (HOSPITAL_COMMUNITY): Payer: Self-pay | Admitting: Hematology and Oncology

## 2014-07-26 ENCOUNTER — Other Ambulatory Visit (HOSPITAL_COMMUNITY): Payer: Commercial Managed Care - HMO

## 2014-07-26 ENCOUNTER — Telehealth (HOSPITAL_COMMUNITY): Payer: Self-pay | Admitting: Hematology and Oncology

## 2014-07-26 ENCOUNTER — Encounter: Payer: Self-pay | Admitting: Gynecologic Oncology

## 2014-07-26 ENCOUNTER — Ambulatory Visit: Payer: Medicare HMO | Attending: Gynecologic Oncology | Admitting: Gynecologic Oncology

## 2014-07-26 VITALS — BP 115/82 | HR 105 | Temp 98.2°F | Resp 20 | Wt 150.5 lb

## 2014-07-26 DIAGNOSIS — Z90710 Acquired absence of both cervix and uterus: Secondary | ICD-10-CM | POA: Diagnosis not present

## 2014-07-26 DIAGNOSIS — Z9079 Acquired absence of other genital organ(s): Secondary | ICD-10-CM | POA: Diagnosis not present

## 2014-07-26 DIAGNOSIS — C55 Malignant neoplasm of uterus, part unspecified: Secondary | ICD-10-CM | POA: Insufficient documentation

## 2014-07-26 DIAGNOSIS — R5383 Other fatigue: Secondary | ICD-10-CM

## 2014-07-26 DIAGNOSIS — R5381 Other malaise: Secondary | ICD-10-CM | POA: Insufficient documentation

## 2014-07-26 DIAGNOSIS — C549 Malignant neoplasm of corpus uteri, unspecified: Secondary | ICD-10-CM

## 2014-07-26 DIAGNOSIS — C541 Malignant neoplasm of endometrium: Secondary | ICD-10-CM

## 2014-07-26 NOTE — Telephone Encounter (Signed)
[facility] SILVERBACK REF APPROVING 12 VISITS 07/04/14-01/04/15

## 2014-07-26 NOTE — Progress Notes (Signed)
**Note Kristina-Identified via Obfuscation** HPI:  Kristina Rodriguez is a 73 y.o. year old No obstetric history on file. initially seen in consultation requested by Dr Glo Herring on 06/12/14 for carcinosarcoma of the endometrium.  She then underwent a robotic assisted total laparoscopic hysterectomy and BSO with pelvic and para-aortic lymphadenectomy on 06/16/14 with Dr Nancy Marus at [facility] without complications.  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  She had her first cycle of chemotherapy with paclitaxel and carboplatin on 07/13/2014 or stage III disease. On the that she receive Neulasta and she states that she does note diffuse body pain and 8 that was not relieved by ibuprofen. She was given narcotics by Dr. for manic with improvement of her symptoms. She also developed some peripheral paresthesias involving the upper extremities including the fingertips. She states that she would otherwise be feeling well otherwise and for the Neulasta injection. She states that she still feels "out of it and week. She's having diarrhea 2-3 times per day but it is controlled with Imodium. She states that she's eating fairly well and has not experienced any nausea vomiting. She does have some urge incontinence as well as stool incontinence with diarrhea. She denies any dysuria. She denies any abdominal or pelvic pain. She denies any vaginal bleeding. She's had no bright red blood per rectum with her stools. She states her leg weakness is improved.    Review of systems: Constitutional:  She has weight loss. She has no fever or chills. Eyes: No blurred vision Ears, Nose, Mouth, Throat: No dizziness, headaches or changes in hearing. No mouth sores. Cardiovascular: No chest pain, palpitations  or edema. Respiratory:  No shortness of breath Gastrointestinal: She has diarrhea. She denies any nausea or vomiting. She denies blood in her stool. Genitourinary:  She denies pelvic pain, pelvic pressure or changes in her urinary function. She has some urge incontinence. She has no irregular vaginal bleeding or vaginal discharge Musculoskeletal: + muscle weakness and joint pains Neurological:  Denies dizziness or headaches. No neuropathy, no numbness or tingling.   Physical Exam: Blood pressure 115/82, pulse 105, temperature 98.2 F (36.8 C), temperature source Oral, resp. rate 20, weight 150 lb 8 oz (68.266 kg). General: Well dressed, well nourished in no apparent distress.    Abdomen:  Soft, nontender, nondistended.  No palpable masses.  No hepatosplenomegaly.  No ascites. Normal bowel sounds.  No hernias.  Incisions are in tact and healing well and free of signs of infection.  Genitourinary: External genitalia within normal limits. Speculum examination reveals an atrophic vagina. The vaginal cuff is visually intact. There is no evidence of any blood or lesions. Bimanual examination reveals no cuff tenderness or fluctuance.   Extremities: No cyanosis, clubbing or edema.   Musculoskeletal: No pain, normal strength and range of motion.  Assessment:    73 y.o. year old with Stage IIIA Grade 3 uterine carcinosarcoma.   S/p robotic hysterectomy, BSO, pelvic and para-aortic lymphadenectomy on 06/16/14. Pathology revealed positive microscopic ovarian involvement. She's status post cycle #1 of paclitaxel and carboplatin. At manic is adjusting her dose schedule satiety with her Neulasta as it appears to have caused a significant amount of symptomatology. She will have 3 cycles of chemotherapy followed by adjuvant radiation followed by an additional 3 cycles of chemotherapy. She  return to see [place] after she completed all of her treatment and we can begin her followup. She has or will be happy to see her  at any time.   Plan: 1) Pathology reports reviewed today 2) Treatment counseling - I discussed the high risk for recurrence with carcinosarcoma tumors,particularly locally advanced tumors. I discussed that at our multidisciplinary treatment planning conference we discussed that we recommend combination chemotherapy and radiation therapy to reduce this risk for both local and distant recurrence. She is a candidate for either paclitaxel with ifosphamide or carboplatin chemotherapy. I am recommending paclitaxel/carboplatin q 21 days due to its better toxicity profile in elderly patients, and the comparable efficacy in phase 2 trials. Because she demonstrated locally advanced disease, we are also recommending pelvic radiation therapy with external beam radiation. We recommend sandwiching this therapy (3 cycles chemotherapy, followed by radiation, followed by an additional 3 cycles of chemotherapy). She is electing for therapy to be administered locally Linna Hoff) at [facility] and [facility]. We will facilitate these referrals. I discussed anticipated treatment course with the patient. We will see her back in approximately 1 month to evaluate her vaginal cuff for healing prior to commencing radiation.   She was given the opportunity to ask questions, which were answered to her satisfaction, and she is agreement with the above mentioned plan of care.  3)  With respect to LE weakness- her pattern is most consistent with left obturator weakness. She continues to decline PT. I discussed with the patient that she should notify us if this does not improve over time as we will facilitate referrals to PT and possibly neurology. I discussed the expected natural history of this weakness is for gradual but usually complete improvement. If this improvement plateaus she should be re-evaluated.  4) Return to clinic in 1 month for cuff evaluation.   Nancy Marus A., MD

## 2014-07-26 NOTE — Patient Instructions (Signed)
Please return to see [place] once you've completed all of your treatment. Please feel free to call us with any questions. Our number is [phone]. Good luck with the remainder of your treatment.

## 2014-07-28 ENCOUNTER — Ambulatory Visit (HOSPITAL_COMMUNITY): Payer: Commercial Managed Care - HMO

## 2014-08-02 ENCOUNTER — Ambulatory Visit (HOSPITAL_COMMUNITY): Payer: Medicare HMO

## 2014-08-02 ENCOUNTER — Inpatient Hospital Stay (HOSPITAL_COMMUNITY): Payer: Medicare HMO

## 2014-08-03 ENCOUNTER — Encounter (HOSPITAL_COMMUNITY): Payer: Self-pay

## 2014-08-03 ENCOUNTER — Ambulatory Visit (HOSPITAL_COMMUNITY): Payer: Medicare HMO

## 2014-08-03 ENCOUNTER — Encounter (HOSPITAL_BASED_OUTPATIENT_CLINIC_OR_DEPARTMENT_OTHER): Payer: Medicare HMO

## 2014-08-03 VITALS — BP 133/88 | HR 105 | Temp 98.2°F | Resp 18 | Wt 151.0 lb

## 2014-08-03 VITALS — BP 123/49 | HR 105 | Temp 97.9°F | Resp 16

## 2014-08-03 DIAGNOSIS — K909 Intestinal malabsorption, unspecified: Secondary | ICD-10-CM

## 2014-08-03 DIAGNOSIS — E785 Hyperlipidemia, unspecified: Secondary | ICD-10-CM

## 2014-08-03 DIAGNOSIS — E119 Type 2 diabetes mellitus without complications: Secondary | ICD-10-CM

## 2014-08-03 DIAGNOSIS — C55 Malignant neoplasm of uterus, part unspecified: Secondary | ICD-10-CM

## 2014-08-03 DIAGNOSIS — D509 Iron deficiency anemia, unspecified: Secondary | ICD-10-CM

## 2014-08-03 DIAGNOSIS — G620 Drug-induced polyneuropathy: Secondary | ICD-10-CM

## 2014-08-03 DIAGNOSIS — D6481 Anemia due to antineoplastic chemotherapy: Secondary | ICD-10-CM

## 2014-08-03 DIAGNOSIS — Z5111 Encounter for antineoplastic chemotherapy: Secondary | ICD-10-CM

## 2014-08-03 DIAGNOSIS — C549 Malignant neoplasm of corpus uteri, unspecified: Secondary | ICD-10-CM

## 2014-08-03 DIAGNOSIS — K219 Gastro-esophageal reflux disease without esophagitis: Secondary | ICD-10-CM

## 2014-08-03 DIAGNOSIS — I10 Essential (primary) hypertension: Secondary | ICD-10-CM

## 2014-08-03 DIAGNOSIS — G622 Polyneuropathy due to other toxic agents: Secondary | ICD-10-CM

## 2014-08-03 DIAGNOSIS — T451X5A Adverse effect of antineoplastic and immunosuppressive drugs, initial encounter: Secondary | ICD-10-CM

## 2014-08-03 LAB — CBC WITH DIFFERENTIAL/PLATELET
Basophils Absolute: 0 10*3/uL (ref 0.0–0.1)
Basophils Relative: 0 % (ref 0–1)
EOS PCT: 0 % (ref 0–5)
Eosinophils Absolute: 0 10*3/uL (ref 0.0–0.7)
HCT: 30.4 % — ABNORMAL LOW (ref 36.0–46.0)
Hemoglobin: 10.2 g/dL — ABNORMAL LOW (ref 12.0–15.0)
Lymphocytes Relative: 12 % (ref 12–46)
Lymphs Abs: 1.3 10*3/uL (ref 0.7–4.0)
MCH: 29.4 pg (ref 26.0–34.0)
MCHC: 33.6 g/dL (ref 30.0–36.0)
MCV: 87.6 fL (ref 78.0–100.0)
MONOS PCT: 9 % (ref 3–12)
Monocytes Absolute: 0.9 10*3/uL (ref 0.1–1.0)
Neutro Abs: 8.5 10*3/uL — ABNORMAL HIGH (ref 1.7–7.7)
Neutrophils Relative %: 79 % — ABNORMAL HIGH (ref 43–77)
PLATELETS: 280 10*3/uL (ref 150–400)
RBC: 3.47 MIL/uL — AB (ref 3.87–5.11)
RDW: 14.8 % (ref 11.5–15.5)
WBC: 10.8 10*3/uL — ABNORMAL HIGH (ref 4.0–10.5)

## 2014-08-03 LAB — COMPREHENSIVE METABOLIC PANEL
ALT: 20 U/L (ref 0–35)
AST: 22 U/L (ref 0–37)
Albumin: 3.1 g/dL — ABNORMAL LOW (ref 3.5–5.2)
Alkaline Phosphatase: 80 U/L (ref 39–117)
Anion gap: 12 (ref 5–15)
BUN: 14 mg/dL (ref 6–23)
CALCIUM: 9 mg/dL (ref 8.4–10.5)
CO2: 24 mEq/L (ref 19–32)
Chloride: 99 mEq/L (ref 96–112)
Creatinine, Ser: 0.86 mg/dL (ref 0.50–1.10)
GFR calc non Af Amer: 65 mL/min — ABNORMAL LOW (ref >90)
GFR, EST AFRICAN AMERICAN: 76 mL/min — AB (ref >90)
GLUCOSE: 334 mg/dL — AB (ref 70–99)
Potassium: 4.6 mEq/L (ref 3.7–5.3)
SODIUM: 135 meq/L — AB (ref 137–147)
TOTAL PROTEIN: 6.4 g/dL (ref 6.0–8.3)
Total Bilirubin: 0.4 mg/dL (ref 0.3–1.2)

## 2014-08-03 MED ORDER — DEXAMETHASONE SODIUM PHOSPHATE 10 MG/ML IJ SOLN: 20.0000 mg | Freq: Once | INTRAMUSCULAR | Status: DC

## 2014-08-03 MED ORDER — GABAPENTIN 300 MG PO CAPS
300.0000 mg | ORAL_CAPSULE | Freq: Once | ORAL | Status: AC
  Administered 2014-08-03: 300 mg via ORAL
  Filled 2014-08-03: qty 1

## 2014-08-03 MED ORDER — PALONOSETRON HCL INJECTION 0.25 MG/5ML
0.2500 mg | Freq: Once | INTRAVENOUS | Status: AC
  Administered 2014-08-03: 0.25 mg via INTRAVENOUS

## 2014-08-03 MED ORDER — SODIUM CHLORIDE 0.9 % IJ SOLN: 10.0000 mL | INTRAMUSCULAR | Status: DC | PRN

## 2014-08-03 MED ORDER — SODIUM CHLORIDE 0.9 % IV SOLN
242.0000 mg | Freq: Once | INTRAVENOUS | Status: AC
  Administered 2014-08-03: 240 mg via INTRAVENOUS
  Filled 2014-08-03: qty 24

## 2014-08-03 MED ORDER — OXYCODONE HCL 5 MG PO TABS: ORAL_TABLET | ORAL | Status: DC

## 2014-08-03 MED ORDER — GABAPENTIN 300 MG PO CAPS: 300.0000 mg | ORAL_CAPSULE | Freq: Three times a day (TID) | ORAL | Status: DC

## 2014-08-03 MED ORDER — HEPARIN SOD (PORK) LOCK FLUSH 100 UNIT/ML IV SOLN
500.0000 [IU] | Freq: Once | INTRAVENOUS | Status: AC | PRN
  Administered 2014-08-03: 500 [IU]

## 2014-08-03 MED ORDER — SODIUM CHLORIDE 0.9 % IV SOLN
Freq: Once | INTRAVENOUS | Status: AC
  Administered 2014-08-03: 09:00:00 via INTRAVENOUS

## 2014-08-03 MED ORDER — SODIUM CHLORIDE 0.9 % IV SOLN: 150.0000 mg | Freq: Once | INTRAVENOUS | Status: DC

## 2014-08-03 MED ORDER — DEXTROSE 5 % IV SOLN
80.0000 mg/m2 | Freq: Once | INTRAVENOUS | Status: AC
  Administered 2014-08-03: 144 mg via INTRAVENOUS
  Filled 2014-08-03: qty 24

## 2014-08-03 MED ORDER — PALONOSETRON HCL INJECTION 0.25 MG/5ML
INTRAVENOUS | Status: AC
  Filled 2014-08-03: qty 5

## 2014-08-03 MED ORDER — HEPARIN SOD (PORK) LOCK FLUSH 100 UNIT/ML IV SOLN
INTRAVENOUS | Status: AC
  Filled 2014-08-03: qty 5

## 2014-08-03 MED ORDER — DIPHENHYDRAMINE HCL 50 MG/ML IJ SOLN
50.0000 mg | Freq: Once | INTRAMUSCULAR | Status: AC
  Administered 2014-08-03: 50 mg via INTRAVENOUS

## 2014-08-03 MED ORDER — DIPHENHYDRAMINE HCL 50 MG/ML IJ SOLN
INTRAMUSCULAR | Status: AC
  Filled 2014-08-03: qty 1

## 2014-08-03 MED ORDER — FAMOTIDINE IN NACL 20-0.9 MG/50ML-% IV SOLN
20.0000 mg | Freq: Once | INTRAVENOUS | Status: AC
  Administered 2014-08-03: 20 mg via INTRAVENOUS
  Filled 2014-08-03: qty 50

## 2014-08-03 MED ORDER — SODIUM CHLORIDE 0.9 % IV SOLN
Freq: Once | INTRAVENOUS | Status: AC
  Administered 2014-08-03: 10:00:00 via INTRAVENOUS
  Filled 2014-08-03: qty 5

## 2014-08-03 NOTE — Progress Notes (Signed)
Tolerated chemotherapy well. 

## 2014-08-03 NOTE — Patient Instructions (Addendum)
[facility] Discharge Instructions for Patients Receiving Chemotherapy  Today you received the following chemotherapy agents Carboplatin and Taxol.  To help prevent nausea and vomiting after your treatment, we encourage you to take your nausea medications, Reglan (metoclopramide) and Compazine as directed.    If you develop nausea and vomiting, or diarrhea that is not controlled by your medication, call the clinic.  The clinic phone number is [phone]. Office hours are Monday-Friday 8:30am-5:00pm.  BELOW ARE SYMPTOMS THAT SHOULD BE REPORTED IMMEDIATELY:  *FEVER GREATER THAN 101.0 F  *CHILLS WITH OR WITHOUT FEVER  NAUSEA AND VOMITING THAT IS NOT CONTROLLED WITH YOUR NAUSEA MEDICATION  *UNUSUAL SHORTNESS OF BREATH  *UNUSUAL BRUISING OR BLEEDING  TENDERNESS IN MOUTH AND THROAT WITH OR WITHOUT PRESENCE OF ULCERS  *URINARY PROBLEMS  *BOWEL PROBLEMS  UNUSUAL RASH Items with * indicate a potential emergency and should be followed up as soon as possible. If you have an emergency after office hours please contact your primary care physician or go to the nearest emergency department.  Please call the clinic during office hours if you have any questions or concerns.   You may also contact the Patient Navigator at [phone] should you have any questions or need assistance in obtaining follow up care. _____________________________________________________________________ Have you asked about our STAR program?    STAR stands for Survivorship Training and Rehabilitation, and this is a nationally recognized cancer care program that focuses on survivorship and rehabilitation.  Cancer and cancer treatments may cause problems, such as, pain, making you feel tired and keeping you from doing the things that you need or want to do. Cancer rehabilitation can help. Our goal is to reduce these troubling effects and help you have the best quality of life possible.  You may  receive a survey from a nurse that asks questions about your current state of health.  Based on the survey results, all eligible patients will be referred to the [facility] program for an evaluation so we can better serve you! A frequently asked questions sheet is available upon request.          Paclitaxel injection What is this medicine? PACLITAXEL (PAK li TAX el) is a chemotherapy drug. It targets fast dividing cells, like cancer cells, and causes these cells to die. This medicine is used to treat ovarian cancer, breast cancer, and other cancers. This medicine may be used for other purposes; ask your health care provider or pharmacist if you have questions. COMMON BRAND NAME(S): Onxol, Taxol What should I tell my health care provider before I take this medicine? They need to know if you have any of these conditions: -blood disorders -irregular heartbeat -infection (especially a virus infection such as chickenpox, cold sores, or herpes) -liver disease -previous or ongoing radiation therapy -an unusual or allergic reaction to paclitaxel, alcohol, polyoxyethylated castor oil, other chemotherapy agents, other medicines, foods, dyes, or preservatives -pregnant or trying to get pregnant -breast-feeding How should I use this medicine? This drug is given as an infusion into a vein. It is administered in a hospital or clinic by a specially trained health care professional. Talk to your pediatrician regarding the use of this medicine in children. Special care may be needed. Overdosage: If you think you have taken too much of this medicine contact a poison control center or emergency room at once. NOTE: This medicine is only for you. Do not share this medicine with others. What if I miss a dose? It is important not  to miss your dose. Call your doctor or health care professional if you are unable to keep an appointment. What may interact with this medicine? Do not take this medicine with any of the  following medications: -disulfiram -metronidazole This medicine may also interact with the following medications: -cyclosporine -diazepam -ketoconazole -medicines to increase blood counts like filgrastim, pegfilgrastim, sargramostim -other chemotherapy drugs like cisplatin, doxorubicin, epirubicin, etoposide, teniposide, vincristine -quinidine -testosterone -vaccines -verapamil Talk to your doctor or health care professional before taking any of these medicines: -acetaminophen -aspirin -ibuprofen -ketoprofen -naproxen This list may not describe all possible interactions. Give your health care provider a list of all the medicines, herbs, non-prescription drugs, or dietary supplements you use. Also tell them if you smoke, drink alcohol, or use illegal drugs. Some items may interact with your medicine. What should I watch for while using this medicine? Your condition will be monitored carefully while you are receiving this medicine. You will need important blood work done while you are taking this medicine. This drug may make you feel generally unwell. This is not uncommon, as chemotherapy can affect healthy cells as well as cancer cells. Report any side effects. Continue your course of treatment even though you feel ill unless your doctor tells you to stop. In some cases, you may be given additional medicines to help with side effects. Follow all directions for their use. Call your doctor or health care professional for advice if you get a fever, chills or sore throat, or other symptoms of a cold or flu. Do not treat yourself. This drug decreases your body's ability to fight infections. Try to avoid being around people who are sick. This medicine may increase your risk to bruise or bleed. Call your doctor or health care professional if you notice any unusual bleeding. Be careful brushing and flossing your teeth or using a toothpick because you may get an infection or bleed more easily. If you  have any dental work done, tell your dentist you are receiving this medicine. Avoid taking products that contain aspirin, acetaminophen, ibuprofen, naproxen, or ketoprofen unless instructed by your doctor. These medicines may hide a fever. Do not become pregnant while taking this medicine. Women should inform their doctor if they wish to become pregnant or think they might be pregnant. There is a potential for serious side effects to an unborn child. Talk to your health care professional or pharmacist for more information. Do not breast-feed an infant while taking this medicine. Men are advised not to father a child while receiving this medicine. What side effects may I notice from receiving this medicine? Side effects that you should report to your doctor or health care professional as soon as possible: -allergic reactions like skin rash, itching or hives, swelling of the face, lips, or tongue -low blood counts - This drug may decrease the number of white blood cells, red blood cells and platelets. You may be at increased risk for infections and bleeding. -signs of infection - fever or chills, cough, sore throat, pain or difficulty passing urine -signs of decreased platelets or bleeding - bruising, pinpoint red spots on the skin, black, tarry stools, nosebleeds -signs of decreased red blood cells - unusually weak or tired, fainting spells, lightheadedness -breathing problems -chest pain -high or low blood pressure -mouth sores -nausea and vomiting -pain, swelling, redness or irritation at the injection site -pain, tingling, numbness in the hands or feet -slow or irregular heartbeat -swelling of the ankle, feet, hands Side effects that usually do  not require medical attention (report to your doctor or health care professional if they continue or are bothersome): -bone pain -complete hair loss including hair on your head, underarms, pubic hair, eyebrows, and eyelashes -changes in the color of  fingernails -diarrhea -loosening of the fingernails -loss of appetite -muscle or joint pain -red flush to skin -sweating This list may not describe all possible side effects. Call your doctor for medical advice about side effects. You may report side effects to FDA at 1-800-FDA-1088. Where should I keep my medicine? This drug is given in a hospital or clinic and will not be stored at home. NOTE: This sheet is a summary. It may not cover all possible information. If you have questions about this medicine, talk to your doctor, pharmacist, or health care provider.  2015, [employer]/Gold Standard. (2012-12-20 16:41:21) Carboplatin injection What is this medicine? CARBOPLATIN (KAR boe pla tin) is a chemotherapy drug. It targets fast dividing cells, like cancer cells, and causes these cells to die. This medicine is used to treat ovarian cancer and many other cancers. This medicine may be used for other purposes; ask your health care provider or pharmacist if you have questions. COMMON BRAND NAME(S): Paraplatin What should I tell my health care provider before I take this medicine? They need to know if you have any of these conditions: -blood disorders -hearing problems -kidney disease -recent or ongoing radiation therapy -an unusual or allergic reaction to carboplatin, cisplatin, other chemotherapy, other medicines, foods, dyes, or preservatives -pregnant or trying to get pregnant -breast-feeding How should I use this medicine? This drug is usually given as an infusion into a vein. It is administered in a hospital or clinic by a specially trained health care professional. Talk to your pediatrician regarding the use of this medicine in children. Special care may be needed. Overdosage: If you think you have taken too much of this medicine contact a poison control center or emergency room at once. NOTE: This medicine is only for you. Do not share this medicine with others. What if I miss a  dose? It is important not to miss a dose. Call your doctor or health care professional if you are unable to keep an appointment. What may interact with this medicine? -medicines for seizures -medicines to increase blood counts like filgrastim, pegfilgrastim, sargramostim -some antibiotics like amikacin, gentamicin, neomycin, streptomycin, tobramycin -vaccines Talk to your doctor or health care professional before taking any of these medicines: -acetaminophen -aspirin -ibuprofen -ketoprofen -naproxen This list may not describe all possible interactions. Give your health care provider a list of all the medicines, herbs, non-prescription drugs, or dietary supplements you use. Also tell them if you smoke, drink alcohol, or use illegal drugs. Some items may interact with your medicine. What should I watch for while using this medicine? Your condition will be monitored carefully while you are receiving this medicine. You will need important blood work done while you are taking this medicine. This drug may make you feel generally unwell. This is not uncommon, as chemotherapy can affect healthy cells as well as cancer cells. Report any side effects. Continue your course of treatment even though you feel ill unless your doctor tells you to stop. In some cases, you may be given additional medicines to help with side effects. Follow all directions for their use. Call your doctor or health care professional for advice if you get a fever, chills or sore throat, or other symptoms of a cold or flu. Do not treat yourself. This  drug decreases your body's ability to fight infections. Try to avoid being around people who are sick. This medicine may increase your risk to bruise or bleed. Call your doctor or health care professional if you notice any unusual bleeding. Be careful brushing and flossing your teeth or using a toothpick because you may get an infection or bleed more easily. If you have any dental work  done, tell your dentist you are receiving this medicine. Avoid taking products that contain aspirin, acetaminophen, ibuprofen, naproxen, or ketoprofen unless instructed by your doctor. These medicines may hide a fever. Do not become pregnant while taking this medicine. Women should inform their doctor if they wish to become pregnant or think they might be pregnant. There is a potential for serious side effects to an unborn child. Talk to your health care professional or pharmacist for more information. Do not breast-feed an infant while taking this medicine. What side effects may I notice from receiving this medicine? Side effects that you should report to your doctor or health care professional as soon as possible: -allergic reactions like skin rash, itching or hives, swelling of the face, lips, or tongue -signs of infection - fever or chills, cough, sore throat, pain or difficulty passing urine -signs of decreased platelets or bleeding - bruising, pinpoint red spots on the skin, black, tarry stools, nosebleeds -signs of decreased red blood cells - unusually weak or tired, fainting spells, lightheadedness -breathing problems -changes in hearing -changes in vision -chest pain -high blood pressure -low blood counts - This drug may decrease the number of white blood cells, red blood cells and platelets. You may be at increased risk for infections and bleeding. -nausea and vomiting -pain, swelling, redness or irritation at the injection site -pain, tingling, numbness in the hands or feet -problems with balance, talking, walking -trouble passing urine or change in the amount of urine Side effects that usually do not require medical attention (report to your doctor or health care professional if they continue or are bothersome): -hair loss -loss of appetite -metallic taste in the mouth or changes in taste This list may not describe all possible side effects. Call your doctor for medical advice  about side effects. You may report side effects to FDA at 1-800-FDA-1088. Where should I keep my medicine? This drug is given in a hospital or clinic and will not be stored at home. NOTE: This sheet is a summary. It may not cover all possible information. If you have questions about this medicine, talk to your doctor, pharmacist, or health care provider.  2015, [employer]/Gold Standard. (2008-02-01 14:38:05)

## 2014-08-03 NOTE — Progress Notes (Signed)
[facility]  OFFICE PROGRESS Kristina Miner, MD [address] [place] [address]  DIAGNOSIS: Uterine carcinosarcoma - Plan: CBC with Differential, Comprehensive metabolic panel, CA 378, CBC with Differential, Comprehensive metabolic panel  Iron deficiency anemia - Plan: CBC with Differential, Comprehensive metabolic panel, CBC with Differential, Comprehensive metabolic panel  Malabsorption of iron  Peripheral neuropathy due to chemotherapy  Chief Complaint  Patient presents with  . Carcinosarcoma of uterus  . Peripheral Neuropathy    CURRENT THERAPY: Carboplatin/Taxol adjuvant therapy for carcinosarcoma of the uterus, treatment begun 07/13/2014  INTERVAL HISTORY: Kristina Rodriguez 73 y.o. female returns for followup and continuation of chemotherapy for carcinosarcoma of the uterus, stage III. Bone pain has resolved a peripheral neuropathic symptoms still persists involving the fingers and toes are numb, tingling, and burning. She is used multiple doses of oxycodone to control discomfort. Bowel movements are regular with no nausea, vomiting, sore mouth, fever, night sweats, vaginal bleeding, dysuria, incontinence, skin rash, headache, or seizures.  MEDICAL HISTORY: Past Medical History  Diagnosis Date  . Diabetes mellitus, type II   . Hypertension   . Hyperlipidemia   . Osteoarthritis   . Iron deficiency anemia     attributed to long-term treatment with a PPI  . Anxiety and depression   . Hepatic steatosis 2006    mild  . Cholelithiasis 2006    Acute and chronic cholecystitis; laparoscopic cholecystectomy in 2006  . 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 Chronic pain   . Collagen vascular disease   . Anxiety   . Uterine cancer 7/15    carcinosarcoma    INTERIM HISTORY: has Diabetes mellitus, type II; Hypertension; Hyperlipidemia; Osteoarthritis; Iron deficiency anemia; Other  chronic pain; Depressive disorder, not elsewhere classified; Screening for malignant neoplasm of the cervix; Uterine mass; and Uterine endometrial cancer, sarcoma on her problem list.    ALLERGIES:  is allergic to codeine.  MEDICATIONS: has a current medication list which includes the following prescription(s): accu-chek smartview, alprazolam, assure comfort lancets 30g, atorvastatin, accu-chek aviva, accu-chek nano smartview, docusate calcium, esomeprazole, fenofibrate, glipizide, ibuprofen, adjustable lancing device, lidocaine-prilocaine, loperamide, losartan, muscle rub, metformin, metoclopramide, oxycodone, oxycodone-acetaminophen, pioglitazone, polyethylene glycol, prochlorperazine, and gabapentin, and the following Facility-Administered Medications: CARBOplatin (PARAPLATIN) 240 mg in sodium chloride 0.9 % 100 mL chemo infusion, heparin lock flush, PACLitaxel (TAXOL) 144 mg in dextrose 5 % 250 mL chemo infusion (> 81m/m2), and sodium chloride.  SURGICAL HISTORY:  Past Surgical History  Procedure Laterality Date  . Tonsillectomy    . Carpal tunnel release  1994    Right  . Cholecystectomy, laparoscopic  2006    Dr. BRomona Curls cholelithiasis  . Colonoscopy  2003    Najeeb Rehman;iron deficiency anemia; normal study; hiatal hernia, gastritis, Schatzki's ring on EGD  . Breast biopsy  10/2012    Left; fibroadenoma  . Cholecystectomy    . Tubal ligation  1970s    removed  . Hysteroscopy w/d&c N/A 06/06/2014    Procedure: DILATATION AND CURETTAGE /HYSTEROSCOPY;  Surgeon: JJonnie Kind MD;  Location: [facility];  Service: Gynecology;  Laterality: N/A;  . Polypectomy N/A 06/06/2014    Procedure: POLYPECTOMY;  Surgeon: JJonnie Kind MD;  Location: [facility];  Service: Gynecology;  Laterality: N/A;  . Abdominal hysterectomy  06/16/14    robotic hysterectomy, BSO, pelvic and para-aortic lymphadenectomy for uterine carcinosarcoma  FAMILY HISTORY: family history includes Cancer in her maternal aunt  and maternal uncle; Cancer (age of onset: 61) in her mother; Congestive Heart Failure in her father; Diabetes in her brother; Hemochromatosis in her brother.  SOCIAL HISTORY:  reports that she has never smoked. She has never used smokeless tobacco. She reports that she does not drink alcohol or use illicit drugs.  REVIEW OF SYSTEMS:  Other than that discussed above is noncontributory.  PHYSICAL EXAMINATION: ECOG PERFORMANCE STATUS: 1 - Symptomatic but completely ambulatory  Blood pressure 133/88, pulse 105, temperature 98.2 F (36.8 C), temperature source Oral, resp. rate 18, weight 151 lb (68.493 kg), SpO2 99.00%.  GENERAL:alert, no distress and comfortable SKIN: skin color, texture, turgor are normal, no rashes or significant lesions EYES: PERLA; Conjunctiva are pink and non-injected, sclera clear SINUSES: No redness or tenderness over maxillary or ethmoid sinuses OROPHARYNX:no exudate, no erythema on lips, buccal mucosa, or tongue. NECK: supple, thyroid normal size, non-tender, without nodularity. No masses CHEST: Normal AP diameter with light port in place. LYMPH:  no palpable lymphadenopathy in the cervical, axillary or inguinal LUNGS: clear to auscultation and percussion with normal breathing effort HEART: regular rate & rhythm and no murmurs. ABDOMEN:abdomen soft, non-tender and normal bowel sounds. Surgical wound well healed. No masses. MUSCULOSKELETAL:no cyanosis of digits and no clubbing. Range of motion normal.  NEURO: alert & oriented x 3 with fluent speech, no focal motor/sensory deficits. Normal DTRs.   LABORATORY DATA: Office Visit on 08/03/2014  Component Date Value Ref Range Status  . WBC 08/03/2014 10.8* 4.0 - 10.5 K/uL Final  . RBC 08/03/2014 3.47* 3.87 - 5.11 MIL/uL Final  . Hemoglobin 08/03/2014 10.2* 12.0 - 15.0 g/dL Final  . HCT 08/03/2014 30.4* 36.0 - 46.0 % Final  . MCV 08/03/2014 87.6  78.0 - 100.0 fL Final  . MCH 08/03/2014 29.4  26.0 - 34.0 pg Final  .  MCHC 08/03/2014 33.6  30.0 - 36.0 g/dL Final  . RDW 08/03/2014 14.8  11.5 - 15.5 % Final  . Platelets 08/03/2014 280  150 - 400 K/uL Final  . Neutrophils Relative % 08/03/2014 79* 43 - 77 % Final  . Neutro Abs 08/03/2014 8.5* 1.7 - 7.7 K/uL Final  . Lymphocytes Relative 08/03/2014 12  12 - 46 % Final  . Lymphs Abs 08/03/2014 1.3  0.7 - 4.0 K/uL Final  . Monocytes Relative 08/03/2014 9  3 - 12 % Final  . Monocytes Absolute 08/03/2014 0.9  0.1 - 1.0 K/uL Final  . Eosinophils Relative 08/03/2014 0  0 - 5 % Final  . Eosinophils Absolute 08/03/2014 0.0  0.0 - 0.7 K/uL Final  . Basophils Relative 08/03/2014 0  0 - 1 % Final  . Basophils Absolute 08/03/2014 0.0  0.0 - 0.1 K/uL Final  . Sodium 08/03/2014 135* 137 - 147 mEq/L Final  . Potassium 08/03/2014 4.6  3.7 - 5.3 mEq/L Final  . Chloride 08/03/2014 99  96 - 112 mEq/L Final  . CO2 08/03/2014 24  19 - 32 mEq/L Final  . Glucose, Bld 08/03/2014 334* 70 - 99 mg/dL Final  . BUN 08/03/2014 14  6 - 23 mg/dL Final  . Creatinine, Ser 08/03/2014 0.86  0.50 - 1.10 mg/dL Final  . Calcium 08/03/2014 9.0  8.4 - 10.5 mg/dL Final  . Total Protein 08/03/2014 6.4  6.0 - 8.3 g/dL Final  . Albumin 08/03/2014 3.1* 3.5 - 5.2 g/dL Final  . AST 08/03/2014 22  0 - 37 U/L Final  .  ALT 08/03/2014 20  0 - 35 U/L Final  . Alkaline Phosphatase 08/03/2014 80  39 - 117 U/L Final  . Total Bilirubin 08/03/2014 0.4  0.3 - 1.2 mg/dL Final  . GFR calc non Af Amer 08/03/2014 65* >90 mL/min Final  . GFR calc Af Amer 08/03/2014 76* >90 mL/min Final   Comment: (NOTE)                          The eGFR has been calculated using the CKD EPI equation.                          This calculation has not been validated in all clinical situations.                          eGFR's persistently <90 mL/min signify possible Chronic Kidney                          Disease.  . Anion gap 08/03/2014 12  5 - 15 Final  Office Visit on 07/19/2014  Component Date Value Ref Range Status  . WBC  07/19/2014 5.0  4.0 - 10.5 K/uL Final  . RBC 07/19/2014 4.27  3.87 - 5.11 MIL/uL Final  . Hemoglobin 07/19/2014 12.6  12.0 - 15.0 g/dL Final  . HCT 07/19/2014 36.6  36.0 - 46.0 % Final  . MCV 07/19/2014 85.7  78.0 - 100.0 fL Final  . MCH 07/19/2014 29.5  26.0 - 34.0 pg Final  . MCHC 07/19/2014 34.4  30.0 - 36.0 g/dL Final  . RDW 07/19/2014 13.4  11.5 - 15.5 % Final  . Platelets 07/19/2014 155  150 - 400 K/uL Final  . Neutrophils Relative % 07/19/2014 67  43 - 77 % Final  . Neutro Abs 07/19/2014 3.4  1.7 - 7.7 K/uL Final  . Lymphocytes Relative 07/19/2014 20  12 - 46 % Final  . Lymphs Abs 07/19/2014 1.0  0.7 - 4.0 K/uL Final  . Monocytes Relative 07/19/2014 8  3 - 12 % Final  . Monocytes Absolute 07/19/2014 0.4  0.1 - 1.0 K/uL Final  . Eosinophils Relative 07/19/2014 5  0 - 5 % Final  . Eosinophils Absolute 07/19/2014 0.2  0.0 - 0.7 K/uL Final  . Basophils Relative 07/19/2014 0  0 - 1 % Final  . Basophils Absolute 07/19/2014 0.0  0.0 - 0.1 K/uL Final  Hospital Outpatient Visit on 07/11/2014  Component Date Value Ref Range Status  . Glucose-Capillary 07/11/2014 153* 70 - 99 mg/dL Final  Hospital Outpatient Visit on 07/11/2014  Component Date Value Ref Range Status  . aPTT 07/11/2014 29  24 - 37 seconds Final  . WBC 07/11/2014 7.2  4.0 - 10.5 K/uL Final  . RBC 07/11/2014 4.14  3.87 - 5.11 MIL/uL Final  . Hemoglobin 07/11/2014 12.1  12.0 - 15.0 g/dL Final  . HCT 07/11/2014 36.9  36.0 - 46.0 % Final  . MCV 07/11/2014 89.1  78.0 - 100.0 fL Final  . MCH 07/11/2014 29.2  26.0 - 34.0 pg Final  . MCHC 07/11/2014 32.8  30.0 - 36.0 g/dL Final  . RDW 07/11/2014 13.4  11.5 - 15.5 % Final  . Platelets 07/11/2014 292  150 - 400 K/uL Final  . Neutrophils Relative % 07/11/2014 57  43 - 77 % Final  . Neutro Abs 07/11/2014 4.1  1.7 -  7.7 K/uL Final  . Lymphocytes Relative 07/11/2014 33  12 - 46 % Final  . Lymphs Abs 07/11/2014 2.4  0.7 - 4.0 K/uL Final  . Monocytes Relative 07/11/2014 7  3 - 12 %  Final  . Monocytes Absolute 07/11/2014 0.5  0.1 - 1.0 K/uL Final  . Eosinophils Relative 07/11/2014 3  0 - 5 % Final  . Eosinophils Absolute 07/11/2014 0.2  0.0 - 0.7 K/uL Final  . Basophils Relative 07/11/2014 0  0 - 1 % Final  . Basophils Absolute 07/11/2014 0.0  0.0 - 0.1 K/uL Final  . Prothrombin Time 07/11/2014 13.5  11.6 - 15.2 seconds Final  . INR 07/11/2014 1.03  0.00 - 1.49 Final    PATHOLOGY: No new pathology.  Urinalysis    Component Value Date/Time   COLORURINE YELLOW 06/01/2014 1130   APPEARANCEUR CLEAR 06/01/2014 1130   LABSPEC <1.005* 06/01/2014 1130   PHURINE 7.0 06/01/2014 1130   GLUCOSEU 100* 06/01/2014 1130   HGBUR NEGATIVE 06/01/2014 1130   BILIRUBINUR NEGATIVE 06/01/2014 1130   KETONESUR NEGATIVE 06/01/2014 1130   PROTEINUR NEGATIVE 06/01/2014 1130   UROBILINOGEN 0.2 06/01/2014 1130   NITRITE NEGATIVE 06/01/2014 1130   LEUKOCYTESUR NEGATIVE 06/01/2014 1130    RADIOGRAPHIC STUDIES: Ir Fluoro Guide Cv Line Right  07/11/2014   CLINICAL DATA:  73 year old female with a history of carcinosarcoma of the uterus, referred for port catheter placement.  EXAM: IR RIGHT FLOURO GUIDE CV LINE; IR ULTRASOUND GUIDANCE VASC ACCESS RIGHT  Date: 07/11/2014  ANESTHESIA/SEDATION: Moderate (conscious) sedation was administered during this procedure. A total of 2 mg Versed and 200 mg Fentanyl were administered intravenously. The patient's vital signs were monitored continuously by radiology nursing throughout the course of the procedure.  Total sedation time: 41 minutes  FLUOROSCOPY TIME:  Zero minutes, 42 seconds  TECHNIQUE: The right neck and chest was prepped with chlorhexidine, and draped in the usual sterile fashion using maximum barrier technique (cap and mask, sterile gown, sterile gloves, large sterile sheet, hand hygiene and cutaneous antiseptic). Antibiotic prophylaxis was provided with 2g Ancef administered IV 1 hr prior to skin incision. Local anesthesia was attained by infiltration with 2%  lidocaine.  Ultrasound demonstrated patency of the right internal jugular vein, and this was documented with an image. Under real-time ultrasound guidance, this vein was accessed with a 21 gauge micropuncture needle and image documentation was performed. A small dermatotomy was made at the access site with an 11 scalpel. A 0.018" wire was advanced into the SVC and the access needle exchanged for a 95F micropuncture vascular sheath. The 0.018" wire was then removed and a 0.035" wire advanced into the IVC.  An appropriate location for the subcutaneous reservoir was selected below the clavicle and an incision was made through the skin and underlying soft tissues. The subcutaneous tissues were then dissected using a combination of blunt and sharp surgical technique and a pocket was formed. A single lumen power injectable portacatheter was then tunneled through the subcutaneous tissues from the pocket to the dermatotomy and the port reservoir placed within the subcutaneous pocket.  The venous access site was then serially dilated and a peel away vascular sheath placed over the wire. The wire was removed and the port catheter advanced into position under fluoroscopic guidance. The catheter tip is positioned at the cavoatrial junction. This was documented with a spot image. The portacatheter was then tested and found to flush and aspirate well. The port was flushed with saline followed by 100 units/mL  heparinized saline.  The pocket was then closed in two layers using first subdermal inverted interrupted absorbable sutures followed by a running subcuticular suture. The epidermis was then sealed with Dermabond. The dermatotomy at the venous access site was also closed with a single inverted subdermal suture and the epidermis sealed with Dermabond.  COMPLICATIONS: None.  The patient tolerated the procedure well.  IMPRESSION: Status post placement of a right-sided IJ single-lumen power-injectable port catheter with ultrasound  and fluoroscopic guidance. The catheter is ready for use.   Electronically Signed   By: Corrie Mckusick O.D.   On: 07/11/2014 17:12   Ir US Guide Vasc Access Right  07/11/2014   CLINICAL DATA:  73 year old female with a history of carcinosarcoma of the uterus, referred for port catheter placement.  EXAM: IR RIGHT FLOURO GUIDE CV LINE; IR ULTRASOUND GUIDANCE VASC ACCESS RIGHT  Date: 07/11/2014  ANESTHESIA/SEDATION: Moderate (conscious) sedation was administered during this procedure. A total of 2 mg Versed and 200 mg Fentanyl were administered intravenously. The patient's vital signs were monitored continuously by radiology nursing throughout the course of the procedure.  Total sedation time: 41 minutes  FLUOROSCOPY TIME:  Zero minutes, 42 seconds  TECHNIQUE: The right neck and chest was prepped with chlorhexidine, and draped in the usual sterile fashion using maximum barrier technique (cap and mask, sterile gown, sterile gloves, large sterile sheet, hand hygiene and cutaneous antiseptic). Antibiotic prophylaxis was provided with 2g Ancef administered IV 1 hr prior to skin incision. Local anesthesia was attained by infiltration with 2% lidocaine.  Ultrasound demonstrated patency of the right internal jugular vein, and this was documented with an image. Under real-time ultrasound guidance, this vein was accessed with a 21 gauge micropuncture needle and image documentation was performed. A small dermatotomy was made at the access site with an 11 scalpel. A 0.018" wire was advanced into the SVC and the access needle exchanged for a 95F micropuncture vascular sheath. The 0.018" wire was then removed and a 0.035" wire advanced into the IVC.  An appropriate location for the subcutaneous reservoir was selected below the clavicle and an incision was made through the skin and underlying soft tissues. The subcutaneous tissues were then dissected using a combination of blunt and sharp surgical technique and a pocket was formed. A  single lumen power injectable portacatheter was then tunneled through the subcutaneous tissues from the pocket to the dermatotomy and the port reservoir placed within the subcutaneous pocket.  The venous access site was then serially dilated and a peel away vascular sheath placed over the wire. The wire was removed and the port catheter advanced into position under fluoroscopic guidance. The catheter tip is positioned at the cavoatrial junction. This was documented with a spot image. The portacatheter was then tested and found to flush and aspirate well. The port was flushed with saline followed by 100 units/mL heparinized saline.  The pocket was then closed in two layers using first subdermal inverted interrupted absorbable sutures followed by a running subcuticular suture. The epidermis was then sealed with Dermabond. The dermatotomy at the venous access site was also closed with a single inverted subdermal suture and the epidermis sealed with Dermabond.  COMPLICATIONS: None.  The patient tolerated the procedure well.  IMPRESSION: Status post placement of a right-sided IJ single-lumen power-injectable port catheter with ultrasound and fluoroscopic guidance. The catheter is ready for use.   Electronically Signed   By: Corrie Mckusick O.D.   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 #2. Iron deficiency anemia secondary to malabsorption, resolved with IV Feraheme.  #3. Gastroesophageal reflux disease on long-term proton pump inhibitor therapy.  #4. Diabetes mellitus, type II, non-insulin requiring, controlled.  #5. Hypertension, controlled.  #6. Hyperlipidemia, on treatment.  #7. Severe bone pain secondary to Neulasta, resolved. #8. Peripheral neuropathy secondary to Taxol and carboplatin. #9. Anemia secondary to chemotherapy.    PLAN:  #1.  Carboplatin will be given at AUC of 3 along with Taxol 80 mg per meter squared on day 1 and 8 every 21 days. No Neulasta will be given. Hopefully the benefit of chemotherapy will be preserved with no worsening neuropathic side effects. #2. Gabapentin 300 mg orally today and prescription will be given for 300 mg 3 times a day until next visit in one week. #3. Continue to take oxycodone for pain as needed. #4. Followup in one week with CBC.   All questions were answered. The patient knows to call the clinic with any problems, questions or concerns. We can certainly see the patient much sooner if necessary.   I spent 30 minutes counseling the patient face to face. The total time spent in the appointment was 40 minutes.    Doroteo Bradford, MD 08/03/2014 1:02 PM  DISCLAIMER:  This note was dictated with voice recognition software.  Similar sounding words can inadvertently be transcribed inaccurately and may not be corrected upon review.

## 2014-08-04 ENCOUNTER — Ambulatory Visit (HOSPITAL_COMMUNITY): Payer: Medicare HMO

## 2014-08-10 LAB — CA 125

## 2014-08-11 ENCOUNTER — Encounter (HOSPITAL_BASED_OUTPATIENT_CLINIC_OR_DEPARTMENT_OTHER): Payer: Medicare HMO

## 2014-08-11 ENCOUNTER — Encounter (HOSPITAL_COMMUNITY): Payer: Self-pay

## 2014-08-11 ENCOUNTER — Encounter (HOSPITAL_COMMUNITY): Payer: Medicare HMO | Attending: Hematology

## 2014-08-11 VITALS — BP 127/83 | HR 111 | Temp 98.0°F | Resp 18 | Wt 154.0 lb

## 2014-08-11 VITALS — BP 121/53 | HR 98 | Temp 98.0°F | Resp 18 | Wt 154.4 lb

## 2014-08-11 DIAGNOSIS — D509 Iron deficiency anemia, unspecified: Secondary | ICD-10-CM | POA: Diagnosis present

## 2014-08-11 DIAGNOSIS — C549 Malignant neoplasm of corpus uteri, unspecified: Secondary | ICD-10-CM | POA: Insufficient documentation

## 2014-08-11 DIAGNOSIS — G622 Polyneuropathy due to other toxic agents: Secondary | ICD-10-CM

## 2014-08-11 DIAGNOSIS — C55 Malignant neoplasm of uterus, part unspecified: Secondary | ICD-10-CM | POA: Insufficient documentation

## 2014-08-11 DIAGNOSIS — D6481 Anemia due to antineoplastic chemotherapy: Secondary | ICD-10-CM

## 2014-08-11 DIAGNOSIS — Z5111 Encounter for antineoplastic chemotherapy: Secondary | ICD-10-CM

## 2014-08-11 LAB — CBC WITH DIFFERENTIAL/PLATELET
Basophils Absolute: 0 10*3/uL (ref 0.0–0.1)
Basophils Relative: 1 % (ref 0–1)
EOS ABS: 0 10*3/uL (ref 0.0–0.7)
EOS PCT: 1 % (ref 0–5)
HCT: 27.8 % — ABNORMAL LOW (ref 36.0–46.0)
Hemoglobin: 9.3 g/dL — ABNORMAL LOW (ref 12.0–15.0)
LYMPHS ABS: 1 10*3/uL (ref 0.7–4.0)
Lymphocytes Relative: 25 % (ref 12–46)
MCH: 29.3 pg (ref 26.0–34.0)
MCHC: 33.5 g/dL (ref 30.0–36.0)
MCV: 87.7 fL (ref 78.0–100.0)
Monocytes Absolute: 0.3 10*3/uL (ref 0.1–1.0)
Monocytes Relative: 6 % (ref 3–12)
NEUTROS PCT: 69 % (ref 43–77)
Neutro Abs: 2.9 10*3/uL (ref 1.7–7.7)
PLATELETS: 242 10*3/uL (ref 150–400)
RBC: 3.17 MIL/uL — AB (ref 3.87–5.11)
RDW: 14.8 % (ref 11.5–15.5)
WBC: 4.2 10*3/uL (ref 4.0–10.5)

## 2014-08-11 LAB — BASIC METABOLIC PANEL
Anion gap: 13 (ref 5–15)
BUN: 17 mg/dL (ref 6–23)
CALCIUM: 8.8 mg/dL (ref 8.4–10.5)
CO2: 27 mEq/L (ref 19–32)
Chloride: 95 mEq/L — ABNORMAL LOW (ref 96–112)
Creatinine, Ser: 0.86 mg/dL (ref 0.50–1.10)
GFR, EST AFRICAN AMERICAN: 76 mL/min — AB (ref >90)
GFR, EST NON AFRICAN AMERICAN: 65 mL/min — AB (ref >90)
GLUCOSE: 232 mg/dL — AB (ref 70–99)
POTASSIUM: 4.3 meq/L (ref 3.7–5.3)
Sodium: 135 mEq/L — ABNORMAL LOW (ref 137–147)

## 2014-08-11 LAB — MAGNESIUM: MAGNESIUM: 1.1 mg/dL — AB (ref 1.5–2.5)

## 2014-08-11 MED ORDER — SODIUM CHLORIDE 0.9 % IV SOLN
Freq: Once | INTRAVENOUS | Status: AC
  Administered 2014-08-11: 10:00:00 via INTRAVENOUS

## 2014-08-11 MED ORDER — CARBOPLATIN CHEMO INJECTION 450 MG/45ML
240.0000 mg | Freq: Once | INTRAVENOUS | Status: AC
  Administered 2014-08-11: 240 mg via INTRAVENOUS
  Filled 2014-08-11: qty 24

## 2014-08-11 MED ORDER — PACLITAXEL CHEMO INJECTION 300 MG/50ML
80.0000 mg/m2 | Freq: Once | INTRAVENOUS | Status: AC
  Administered 2014-08-11: 144 mg via INTRAVENOUS
  Filled 2014-08-11: qty 24

## 2014-08-11 MED ORDER — HEPARIN SOD (PORK) LOCK FLUSH 100 UNIT/ML IV SOLN
500.0000 [IU] | Freq: Once | INTRAVENOUS | Status: AC | PRN
  Administered 2014-08-11: 500 [IU]
  Filled 2014-08-11: qty 5

## 2014-08-11 MED ORDER — SODIUM CHLORIDE 0.9 % IJ SOLN
10.0000 mL | INTRAMUSCULAR | Status: DC | PRN
  Administered 2014-08-11: 10 mL

## 2014-08-11 MED ORDER — DEXAMETHASONE SODIUM PHOSPHATE 10 MG/ML IJ SOLN: 20.0000 mg | Freq: Once | INTRAMUSCULAR | Status: DC

## 2014-08-11 MED ORDER — MAGNESIUM SULFATE 4000MG/100ML IJ SOLN
4.0000 g | Freq: Once | INTRAMUSCULAR | Status: AC
  Administered 2014-08-11: 4 g via INTRAVENOUS
  Filled 2014-08-11: qty 100

## 2014-08-11 MED ORDER — MAGNESIUM OXIDE 400 (241.3 MG) MG PO TABS: 400.0000 mg | ORAL_TABLET | Freq: Two times a day (BID) | ORAL | Status: DC

## 2014-08-11 MED ORDER — SODIUM CHLORIDE 0.9 % IV SOLN: 480.0000 mg | Freq: Once | INTRAVENOUS | Status: DC

## 2014-08-11 MED ORDER — FAMOTIDINE IN NACL 20-0.9 MG/50ML-% IV SOLN
20.0000 mg | Freq: Once | INTRAVENOUS | Status: AC
  Administered 2014-08-11: 20 mg via INTRAVENOUS
  Filled 2014-08-11: qty 50

## 2014-08-11 MED ORDER — PALONOSETRON HCL INJECTION 0.25 MG/5ML
0.2500 mg | Freq: Once | INTRAVENOUS | Status: AC
  Administered 2014-08-11: 0.25 mg via INTRAVENOUS
  Filled 2014-08-11: qty 5

## 2014-08-11 MED ORDER — SODIUM CHLORIDE 0.9 % IV SOLN
20.0000 mg | Freq: Once | INTRAVENOUS | Status: AC
  Administered 2014-08-11: 20 mg via INTRAVENOUS
  Filled 2014-08-11: qty 2

## 2014-08-11 NOTE — Progress Notes (Signed)
[facility] OFFICE PROGRESS NOTE  PCP [name], MD [address] [place] [address]  DIAGNOSIS: Uterine endometrial cancer, sarcoma - Plan: CBC with Differential, Chemistries;  CURRENT THERAPY:  Carboplatin and Taxol adjuvant therapy for carcinosarcoma of the uterus. Post operative treatment began 07/13/14 and Cycle #2 initiated 1 week ago. Schedule modified to Day 1, Day 8, Day 22, and Day 29 due to side effects with Taxol and Neulasta.   INTERVAL HEMATOLOGY/ONCOLOGY HX: Kristina Rodriguez is a pleasant 73 y.o. Female returning for continuation of chemotherapy for her Stage III uterine cancer. She has undergone a previous TAH/BSO with lymphadenectomy. Side effects with her first cycle of full dose chemotherapy was associated with myalgias, bone pain, and peripheral neuropathy. This has been mnanaged with oxycodone and gabapentin. She is doing better and was able to tolerate treatment on day 1 last week with dose reduction. She denies any new symptoms or problems.     MEDICAL HISTORY:  Past Medical History  Diagnosis Date  . Diabetes mellitus, type II   . Hypertension   . Hyperlipidemia   . Osteoarthritis   . Iron deficiency anemia     attributed to long-term treatment with a PPI  . Anxiety and depression   . Hepatic steatosis 2006    mild  . Cholelithiasis 2006    Acute and chronic cholecystitis; laparoscopic cholecystectomy in 2006  . 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 Chronic pain   . Collagen vascular disease   . Anxiety   . 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    ALLERGIES:  is allergic to  codeine.  MEDICATIONS: has a current medication list which includes the following prescription(s): accu-chek smartview, alprazolam, assure comfort lancets 30g, atorvastatin, accu-chek aviva, accu-chek nano smartview, docusate calcium, esomeprazole, fenofibrate, gabapentin, glipizide, ibuprofen, adjustable lancing device, lidocaine-prilocaine, loperamide, losartan, muscle rub, metformin, metoclopramide, oxycodone, oxycodone-acetaminophen, pioglitazone, polyethylene glycol, prochlorperazine, and magnesium oxide, and the following Facility-Administered Medications: sodium chloride.  FAMILY HISTORY: family history includes Cancer in her maternal aunt and maternal uncle; Cancer (age of onset: 70) in her mother; Congestive Heart Failure in her father; Diabetes in her brother; Hemochromatosis in her brother.  REVIEW OF SYSTEMS:    SINCE YOUR LAST VISIT Been diagnosed or treated for a new medical /surgical  problem or condition: No Any Recent Xrays or studies performed: No Any new prescription or OTC medications: No ECOG Perf Status: Ambulatory and capable of all selfcare but unable to carry out any work activities.  Up and about more than 50% of waking hours Problems sleeping: No Medications taken to help sleep: No How is your appetie: 100% normal Any Supplements: Yes (drinks Boost sometimes) Any trouble chewing or swallowing: Yes- Liquids (she said just a couple of times) Any Nausea or Vomiting: No Any Bowel problems: No # Bowel Movements per week: 7 Any Urinary Issues: Yes (urinary incontinence) Any Cardiac Problems: No Any Respiratory Issues: No Any Neurological Issues: No Do you live alone: No Feelings hopelessness: No You or your family have any concerns or Health changes: No Pain Assessment Pain Score: 5  Pain Location: Leg Pain Orientation: Right;Left Pain Descriptors / Indicators: Numbness;Tingling Pain Frequency:  Constant Pain Intervention(s): Rest Significant Pain in Recent  Past: Yes, chronic  Other than that discussed above is noncontributory.  PHYSICAL EXAMINATION:   weight is 154 lb (69.854 kg). Her oral temperature is 98 F (36.7 C). Her blood pressure is 127/83 and her pulse is 111. Her respiration is 18 and oxygen saturation is 100%.    GENERAL: alert, no distress and comfortable SKIN: skin color, texture, turgor are normal, no rashes or significant lesions. No cyanosis of digits and no clubbing.  EYES: PERLA; Conjunctiva are pink and non-injected, sclera clear OROPHARYNX: no exudate, no erythema on lips, buccal mucosa, or tongue. NECK: supple, non-tender, without nodularity. No masses LYMPH:  no palpable lymphadenopathy in the cervical, axillary or inguinal regions. LUNGS: clear to auscultation and percussion with normal breathing effort.  HEART: regular rate & rhythm and no murmurs.  BREASTS: Exam deferred. ABDOMEN: abdomen soft, non-tender and normal bowel sounds. Incisions healed.   MUSCULOSKELETAL/EXTREMITIES: Range of motion normal.  NEURO: alert & oriented x 3 with fluent speech, no focal motor/sensory deficits except for absent ankle jerks.   LABORATORY DATA: Infusion on 08/11/2014  Component Date Value Ref Range Status  . WBC 08/11/2014 4.2  4.0 - 10.5 K/uL Final  . RBC 08/11/2014 3.17* 3.87 - 5.11 MIL/uL Final  . Hemoglobin 08/11/2014 9.3* 12.0 - 15.0 g/dL Final  . HCT 66/71/7795 27.8* 36.0 - 46.0 % Final  . MCV 08/11/2014 87.7  78.0 - 100.0 fL Final  . MCH 08/11/2014 29.3  26.0 - 34.0 pg Final  . MCHC 08/11/2014 33.5  30.0 - 36.0 g/dL Final  . RDW 64/62/9009 14.8  11.5 - 15.5 % Final  . Platelets 08/11/2014 242  150 - 400 K/uL Final  . Neutrophils Relative % 08/11/2014 69  43 - 77 % Final  . Neutro Abs 08/11/2014 2.9  1.7 - 7.7 K/uL Final  . Lymphocytes Relative 08/11/2014 25  12 - 46 % Final  . Lymphs Abs 08/11/2014 1.0  0.7 - 4.0 K/uL Final  . Monocytes Relative 08/11/2014 6  3 - 12 % Final  . Monocytes Absolute 08/11/2014 0.3   0.1 - 1.0 K/uL Final  . Eosinophils Relative 08/11/2014 1  0 - 5 % Final  . Eosinophils Absolute 08/11/2014 0.0  0.0 - 0.7 K/uL Final  . Basophils Relative 08/11/2014 1  0 - 1 % Final  . Basophils Absolute 08/11/2014 0.0  0.0 - 0.1 K/uL Final  . Sodium 08/11/2014 135* 137 - 147 mEq/L Final  . Potassium 08/11/2014 4.3  3.7 - 5.3 mEq/L Final  . Chloride 08/11/2014 95* 96 - 112 mEq/L Final  . CO2 08/11/2014 27  19 - 32 mEq/L Final  . Glucose, Bld 08/11/2014 232* 70 - 99 mg/dL Final  . BUN 44/61/5582 17  6 - 23 mg/dL Final  . Creatinine, Ser 08/11/2014 0.86  0.50 - 1.10 mg/dL Final  . Calcium 83/32/3348 8.8  8.4 - 10.5 mg/dL Final  . GFR calc non Af Amer 08/11/2014 65* >90 mL/min Final  . GFR calc Af Amer 08/11/2014 76* >90 mL/min Final   Comment: (NOTE)                          The eGFR has been calculated using the CKD EPI equation.                          This calculation has not been validated in all clinical  situations.                          eGFR's persistently <90 mL/min signify possible Chronic Kidney                          Disease.  . Anion gap 08/11/2014 13  5 - 15 Final  . Magnesium 08/11/2014 1.1* 1.5 - 2.5 mg/dL Final  Office Visit on 08/03/2014  Component Date Value Ref Range Status  . CA 125 08/03/2014 SEE SEPARATE REPORT  0.0 - 30.2 U/mL Final  . WBC 08/03/2014 10.8* 4.0 - 10.5 K/uL Final  . RBC 08/03/2014 3.47* 3.87 - 5.11 MIL/uL Final  . Hemoglobin 08/03/2014 10.2* 12.0 - 15.0 g/dL Final  . HCT 08/03/2014 30.4* 36.0 - 46.0 % Final  . MCV 08/03/2014 87.6  78.0 - 100.0 fL Final  . MCH 08/03/2014 29.4  26.0 - 34.0 pg Final  . MCHC 08/03/2014 33.6  30.0 - 36.0 g/dL Final  . RDW 08/03/2014 14.8  11.5 - 15.5 % Final  . Platelets 08/03/2014 280  150 - 400 K/uL Final  . Neutrophils Relative % 08/03/2014 79* 43 - 77 % Final  . Neutro Abs 08/03/2014 8.5* 1.7 - 7.7 K/uL Final  . Lymphocytes Relative 08/03/2014 12  12 - 46 % Final  . Lymphs Abs 08/03/2014 1.3  0.7 - 4.0  K/uL Final  . Monocytes Relative 08/03/2014 9  3 - 12 % Final  . Monocytes Absolute 08/03/2014 0.9  0.1 - 1.0 K/uL Final  . Eosinophils Relative 08/03/2014 0  0 - 5 % Final  . Eosinophils Absolute 08/03/2014 0.0  0.0 - 0.7 K/uL Final  . Basophils Relative 08/03/2014 0  0 - 1 % Final  . Basophils Absolute 08/03/2014 0.0  0.0 - 0.1 K/uL Final  . Sodium 08/03/2014 135* 137 - 147 mEq/L Final  . Potassium 08/03/2014 4.6  3.7 - 5.3 mEq/L Final  . Chloride 08/03/2014 99  96 - 112 mEq/L Final  . CO2 08/03/2014 24  19 - 32 mEq/L Final  . Glucose, Bld 08/03/2014 334* 70 - 99 mg/dL Final  . BUN 08/03/2014 14  6 - 23 mg/dL Final  . Creatinine, Ser 08/03/2014 0.86  0.50 - 1.10 mg/dL Final  . Calcium 08/03/2014 9.0  8.4 - 10.5 mg/dL Final  . Total Protein 08/03/2014 6.4  6.0 - 8.3 g/dL Final  . Albumin 08/03/2014 3.1* 3.5 - 5.2 g/dL Final  . AST 08/03/2014 22  0 - 37 U/L Final  . ALT 08/03/2014 20  0 - 35 U/L Final  . Alkaline Phosphatase 08/03/2014 80  39 - 117 U/L Final  . Total Bilirubin 08/03/2014 0.4  0.3 - 1.2 mg/dL Final  . GFR calc non Af Amer 08/03/2014 65* >90 mL/min Final  . GFR calc Af Amer 08/03/2014 76* >90 mL/min Final   Comment: (NOTE)                          The eGFR has been calculated using the CKD EPI equation.                          This calculation has not been validated in all clinical situations.  eGFR's persistently <90 mL/min signify possible Chronic Kidney                          Disease.  . Anion gap 08/03/2014 12  5 - 15 Final  Office Visit on 07/19/2014  Component Date Value Ref Range Status  . WBC 07/19/2014 5.0  4.0 - 10.5 K/uL Final  . RBC 07/19/2014 4.27  3.87 - 5.11 MIL/uL Final  . Hemoglobin 07/19/2014 12.6  12.0 - 15.0 g/dL Final  . HCT 07/19/2014 36.6  36.0 - 46.0 % Final  . MCV 07/19/2014 85.7  78.0 - 100.0 fL Final  . MCH 07/19/2014 29.5  26.0 - 34.0 pg Final  . MCHC 07/19/2014 34.4  30.0 - 36.0 g/dL Final  . RDW 07/19/2014  13.4  11.5 - 15.5 % Final  . Platelets 07/19/2014 155  150 - 400 K/uL Final  . Neutrophils Relative % 07/19/2014 67  43 - 77 % Final  . Neutro Abs 07/19/2014 3.4  1.7 - 7.7 K/uL Final  . Lymphocytes Relative 07/19/2014 20  12 - 46 % Final  . Lymphs Abs 07/19/2014 1.0  0.7 - 4.0 K/uL Final  . Monocytes Relative 07/19/2014 8  3 - 12 % Final  . Monocytes Absolute 07/19/2014 0.4  0.1 - 1.0 K/uL Final  . Eosinophils Relative 07/19/2014 5  0 - 5 % Final  . Eosinophils Absolute 07/19/2014 0.2  0.0 - 0.7 K/uL Final  . Basophils Relative 07/19/2014 0  0 - 1 % Final  . Basophils Absolute 07/19/2014 0.0  0.0 - 0.1 K/uL Final    CA 125-26, normal is less than 37.  RADIOGRAPHIC STUDIES: No results found.  ASSESSMENT:   #1. Stage III carcinosarcoma of the uterus, status post TAH, BSO, pelvic and para-aortic lymphadenectomy    #2. Iron deficiency anemia responsive to IV Feraheme  #3. 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 Anemia secondary to chemotherapy  #5. Hypomagnesemia secondary to chemotherapy   RECOMMENDATIONS:  Ms. Piggee is agreeable to resuming chemotherapy for Day 8 of treatment with Taxol $RemoveBe'80mg'[id number]$  per meter squared and Carboplatin at an AUC of 3.She will continue with supportive care. Magnesium Sulphate 4gm administered IV and oral magnesium oxide ordered. She may need additional iron or transfusion for her anemia. Followup appointment in 2 weeks prior to beginning Cycle 3.    All questions were answered. The patient knows to call the clinic with any problems, questions or concerns. We can certainly see the patient much sooner if necessary.    Darrall Dears, MD 08/11/2014 4:29 PM  .

## 2014-08-11 NOTE — Patient Instructions (Signed)
[facility] Discharge Instructions  RECOMMENDATIONS MADE BY THE CONSULTANT AND ANY TEST RESULTS WILL BE SENT TO YOUR REFERRING PHYSICIAN.  EXAM FINDINGS BY THE PHYSICIAN TODAY AND SIGNS OR SYMPTOMS TO REPORT TO CLINIC OR PRIMARY PHYSICIAN: Exam and findings as discussed by Dr Bubba Hales.  Return here in 14 days for labs, office visit, and treatment.  Please call with any questions or concerns.     Thank you for choosing [facility] to provide your oncology and hematology care.  To afford each patient quality time with our providers, please arrive at least 15 minutes before your scheduled appointment time.  With your help, our goal is to use those 15 minutes to complete the necessary work-up to ensure our physicians have the information they need to help with your evaluation and healthcare recommendations.    Effective January 1st, 2014, we ask that you re-schedule your appointment with our physicians should you arrive 10 or more minutes late for your appointment.  We strive to give you quality time with our providers, and arriving late affects you and other patients whose appointments are after yours.    Again, thank you for choosing [facility].  Our hope is that these requests will decrease the amount of time that you wait before being seen by our physicians.       _____________________________________________________________  Should you have questions after your visit to [facility], please contact our office at [phone] between the hours of 8:30 a.m. and 4:30 p.m.  Voicemails left after 4:30 p.m. will not be returned until the following business day.  For prescription refill requests, have your pharmacy contact our office with your prescription refill request.    _______________________________________________________________  We hope that we have given you very good care.  You may receive a patient satisfaction survey in the  mail, please complete it and return it as soon as possible.  We value your feedback!  _______________________________________________________________  Have you asked about our STAR program?  STAR stands for Survivorship Training and Rehabilitation, and this is a nationally recognized cancer care program that focuses on survivorship and rehabilitation.  Cancer and cancer treatments may cause problems, such as, pain, making you feel tired and keeping you from doing the things that you need or want to do. Cancer rehabilitation can help. Our goal is to reduce these troubling effects and help you have the best quality of life possible.  You may receive a survey from a nurse that asks questions about your current state of health.  Based on the survey results, all eligible patients will be referred to the [facility] program for an evaluation so we can better serve you!  A frequently asked questions sheet is available upon request.

## 2014-08-11 NOTE — Progress Notes (Signed)
Tolerated well

## 2014-08-11 NOTE — Progress Notes (Signed)
LABS FOR CBCD, MG,BMP

## 2014-08-20 NOTE — Progress Notes (Signed)
Kristina Noble, MD [address] [place] [address]  Uterine endometrial cancer, sarcoma - Plan: Comprehensive metabolic panel, Magnesium, CA 125, CBC with Differential, Comprehensive metabolic panel, Magnesium  Iron deficiency anemia - Plan: Ferritin, Iron and TIBC, Soluble transferrin receptor  Peripheral neuropathy due to chemotherapy - Plan: gabapentin (NEURONTIN) 300 MG capsule  Anemia due to chemotherapy  CURRENT THERAPY: Carboplatin/Taxol adjuvant therapy for carcinosarcoma of the uterus, treatment began 07/13/2014  INTERVAL HISTORY: Kristina Rodriguez 73 y.o. female returns for  regular  visit for followup of carcinosarcoma of the uterus, stage III.  She has undergone a previous TAH/BSO with lymphadenectomy.  Today the patient is seen walking into the clinic with the help of her husband holding her up to walk due to peripheral neuropathy induced by chemotherapy.  As a result of this severe peripheral neuropathy, chemotherapy will be cancelled today.  I will increase Gabapentin dose.  She notes that her toes and fingers, and sole of foot are numb with decreased sensation.  As a result, we will refer the patient to Radiation Oncology for consideration of pelvic radiation.  I personally reviewed and went over laboratory results with the patient.  The results are noted within this dictation.  Her Hgb is noted to be 9.0 g/dL.  We know a Hgb of 10 g/dL or greater provide better efficacy when undergoing radiation.  As a result, I will order 2 units of PRBCs for the patient today.  We have discussed the risks, benefits, alteratives, and side effects of this intervention.  ESA therapy is contraindicated due to curative intent of treatment.  She notes some intermittent constipation.  She is using stool softeners.  I provided recommendations to include MOM or MiraLax if needed in addition to stool softeners.   Oncologically, she denies any complaints and ROS questioning is  otherwise negative.   Past Medical History  Diagnosis Date  . Diabetes mellitus, type II   . Hypertension   . Hyperlipidemia   . Osteoarthritis   . Iron deficiency anemia     attributed to long-term treatment with a PPI  . Anxiety and depression   . Hepatic steatosis 2006    mild  . Cholelithiasis 2006    Acute and chronic cholecystitis; laparoscopic cholecystectomy in 2006  . 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 Chronic pain   . Collagen vascular disease   . Anxiety   . Uterine cancer 7/15    carcinosarcoma    has Diabetes mellitus, type II; Hypertension; Hyperlipidemia; Osteoarthritis; Iron deficiency anemia; Other chronic pain; Depressive disorder, not elsewhere classified; Screening for malignant neoplasm of the cervix; Uterine mass; Uterine endometrial cancer, sarcoma; and Hypomagnesemia on her problem list.     is allergic to codeine.  Ms. Jurewicz had no medications administered during this visit.  Past Surgical History  Procedure Laterality Date  . Tonsillectomy    . Carpal tunnel release  1994    Right  . Cholecystectomy, laparoscopic  2006    Dr. Romona Curls; cholelithiasis  . Colonoscopy  2003    Najeeb Rehman;iron deficiency anemia; normal study; hiatal hernia, gastritis, Schatzki's ring on EGD  . Breast biopsy  10/2012    Left; fibroadenoma  . Cholecystectomy    . Tubal ligation  1970s    removed  . Hysteroscopy w/d&c N/A 06/06/2014    Procedure: DILATATION AND CURETTAGE /HYSTEROSCOPY;  Surgeon: Jonnie Kind, MD;  Location: [facility];  Service: Gynecology;  Laterality: N/A;  . Polypectomy N/A 06/06/2014    Procedure: POLYPECTOMY;  Surgeon: Jonnie Kind, MD;  Location: [facility];  Service: Gynecology;  Laterality: N/A;  . Abdominal hysterectomy  06/16/14    robotic hysterectomy, BSO, pelvic and para-aortic lymphadenectomy for uterine carcinosarcoma    Denies any headaches, dizziness, double vision, fevers, chills, night sweats,  nausea, vomiting, diarrhea, constipation, chest pain, heart palpitations, shortness of breath, blood in stool, black tarry stool, urinary pain, urinary burning, urinary frequency, hematuria.   PHYSICAL EXAMINATION  ECOG PERFORMANCE STATUS: 2 - Symptomatic, <50% confined to bed  Filed Vitals:   08/24/14 0900  BP: 141/61  Pulse: 91  Temp: 97.8 F (36.6 C)  Resp: 18    GENERAL:alert, no distress, well nourished, well developed, comfortable, cooperative, obese and smiling SKIN: skin color, texture, turgor are normal, no rashes or significant lesions HEAD: Normocephalic, No masses, lesions, tenderness or abnormalities EYES: normal, PERRLA, EOMI, Conjunctiva are pink and non-injected EARS: External ears normal OROPHARYNX:mucous membranes are moist  NECK: supple, thyroid normal size, non-tender, without nodularity, no stridor, non-tender, trachea midline LYMPH:  no palpable lymphadenopathy BREAST:not examined LUNGS: clear to auscultation  HEART: regular rate & rhythm ABDOMEN:abdomen soft, non-tender and normal bowel sounds BACK: Back symmetric, no curvature., No CVA tenderness EXTREMITIES:less then 2 second capillary refill, no joint deformities, effusion, or inflammation, no edema, no skin discoloration, no clubbing, no cyanosis  NEURO: alert & oriented x 3 with fluent speech, no focal motor/sensory deficits, positive findings: sensory deficit in toes, feet, and finger, and abnormal gait secondary to peripheral neuropathy   LABORATORY DATA: CBC    Component Value Date/Time   WBC 4.8 08/24/2014 0833   RBC 2.98* 08/24/2014 0833   RBC 4.05 01/26/2014 0920   HGB 9.0* 08/24/2014 0833   HCT 27.1* 08/24/2014 0833   PLT 147* 08/24/2014 0833   MCV 90.9 08/24/2014 0833   MCH 30.2 08/24/2014 0833   MCHC 33.2 08/24/2014 0833   RDW 18.8* 08/24/2014 0833   LYMPHSABS 1.2 08/24/2014 0833   MONOABS 0.5 08/24/2014 0833   EOSABS 0.0 08/24/2014 0833   BASOSABS 0.0 08/24/2014 0833       Chemistry      Component Value Date/Time   NA 136* 08/24/2014 0833   K 4.5 08/24/2014 0833   CL 98 08/24/2014 0833   CO2 22 08/24/2014 0833   BUN 18 08/24/2014 0833   CREATININE 0.87 08/24/2014 0833      Component Value Date/Time   CALCIUM 9.5 08/24/2014 0833   ALKPHOS 60 08/24/2014 0833   AST 18 08/24/2014 0833   ALT 13 08/24/2014 0833   BILITOT 0.4 08/24/2014 0833       ASSESSMENT:  1. Stage III carcinosarcoma of the uterus, status post TAH, BSO, pelvic and para-aortic lymphadenectomy.  Receiving systemic chemotherapy with Carbo/Taxol. 2. Iron deficiency anemia responsive to IV Feraheme  3. 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 Anemia secondary to chemotherapy  5. Hypomagnesemia secondary to chemotherapy 6. Peripheral neuropathy, grade 3 7. Anemia secondary to chemotherapy. 8. Constipation  Patient Active Problem List   Diagnosis Date Noted  . Hypomagnesemia 08/11/2014  . Uterine endometrial cancer, sarcoma 06/12/2014  . Screening for malignant neoplasm of the cervix 05/18/2014  . Uterine mass 05/18/2014  . Other chronic pain 05/17/2014  . Depressive disorder, not elsewhere classified 05/17/2014  . Diabetes mellitus, type II   . Hypertension   . Hyperlipidemia   . Osteoarthritis   . Iron  deficiency anemia      PLAN:  1. I personally reviewed and went over laboratory results with the patient.  The results are noted within this dictation. 2. Pre-chemo labs as ordered: CBC diff, CMET, CA 125, Magnesium 3. Additional labs: iron/TIBC, ferritin, soluble transferrin receptor. 4. Cancel chemotherapy today and proceed with radiation at [facility].  Treatment plan updated to reflect changes. 5. Refer to Rad Onc for pelvic radiation. 6. 2 unit PRBCs today for Hgb of 9.0 g/dL in preparation for radiation therapy in the near future 7. Increase to Gabapentin 600 mg TID 8. I have encouraged the patient to use OTC MOM or MiraLax if  needed. 9. Patient education regarding chemotherapy-induced anemia 10. Patient education regarding chemotherapy-induced peripheral neuropathy 11. Return in 3 weeks for follow-up.   THERAPY PLAN:  We will cancel treatment today due to severe peripheral neuropathy and refer the patient to Radiation Oncology for pelvic radiation per Dr. Clabe Seal consult note.   All questions were answered. The patient knows to call the clinic with any problems, questions or concerns. We can certainly see the patient much sooner if necessary.  Patient and plan discussed with Dr. Farrel Gobble and he is in agreement with the aforementioned.   More than 50% of the time spent with the patient was utilized for counseling and coordination of care.   Domnic Vantol 08/24/2014

## 2014-08-24 ENCOUNTER — Encounter (HOSPITAL_BASED_OUTPATIENT_CLINIC_OR_DEPARTMENT_OTHER): Payer: Medicare HMO | Admitting: Oncology

## 2014-08-24 ENCOUNTER — Encounter (HOSPITAL_COMMUNITY): Payer: Self-pay

## 2014-08-24 ENCOUNTER — Encounter (HOSPITAL_COMMUNITY): Payer: Self-pay | Admitting: Lab

## 2014-08-24 ENCOUNTER — Encounter (HOSPITAL_BASED_OUTPATIENT_CLINIC_OR_DEPARTMENT_OTHER): Payer: Medicare HMO

## 2014-08-24 VITALS — BP 122/54 | HR 83 | Temp 98.2°F | Resp 18

## 2014-08-24 VITALS — BP 141/61 | HR 91 | Temp 97.8°F | Resp 18 | Wt 155.4 lb

## 2014-08-24 DIAGNOSIS — C549 Malignant neoplasm of corpus uteri, unspecified: Secondary | ICD-10-CM

## 2014-08-24 DIAGNOSIS — D6481 Anemia due to antineoplastic chemotherapy: Secondary | ICD-10-CM

## 2014-08-24 DIAGNOSIS — T451X5A Adverse effect of antineoplastic and immunosuppressive drugs, initial encounter: Secondary | ICD-10-CM

## 2014-08-24 DIAGNOSIS — D509 Iron deficiency anemia, unspecified: Secondary | ICD-10-CM

## 2014-08-24 DIAGNOSIS — G620 Drug-induced polyneuropathy: Secondary | ICD-10-CM

## 2014-08-24 LAB — IRON AND TIBC
Iron: 79 ug/dL (ref 42–135)
Saturation Ratios: 26 % (ref 20–55)
TIBC: 308 ug/dL (ref 250–470)
UIBC: 229 ug/dL (ref 125–400)

## 2014-08-24 LAB — CBC WITH DIFFERENTIAL/PLATELET
BASOS ABS: 0 10*3/uL (ref 0.0–0.1)
Basophils Relative: 0 % (ref 0–1)
EOS PCT: 0 % (ref 0–5)
Eosinophils Absolute: 0 10*3/uL (ref 0.0–0.7)
HEMATOCRIT: 27.1 % — AB (ref 36.0–46.0)
Hemoglobin: 9 g/dL — ABNORMAL LOW (ref 12.0–15.0)
LYMPHS PCT: 25 % (ref 12–46)
Lymphs Abs: 1.2 10*3/uL (ref 0.7–4.0)
MCH: 30.2 pg (ref 26.0–34.0)
MCHC: 33.2 g/dL (ref 30.0–36.0)
MCV: 90.9 fL (ref 78.0–100.0)
MONO ABS: 0.5 10*3/uL (ref 0.1–1.0)
Monocytes Relative: 10 % (ref 3–12)
Neutro Abs: 3.1 10*3/uL (ref 1.7–7.7)
Neutrophils Relative %: 65 % (ref 43–77)
Platelets: 147 10*3/uL — ABNORMAL LOW (ref 150–400)
RBC: 2.98 MIL/uL — ABNORMAL LOW (ref 3.87–5.11)
RDW: 18.8 % — AB (ref 11.5–15.5)
WBC: 4.8 10*3/uL (ref 4.0–10.5)

## 2014-08-24 LAB — FERRITIN: Ferritin: 400 ng/mL — ABNORMAL HIGH (ref 10–291)

## 2014-08-24 LAB — COMPREHENSIVE METABOLIC PANEL
ALT: 13 U/L (ref 0–35)
ANION GAP: 16 — AB (ref 5–15)
AST: 18 U/L (ref 0–37)
Albumin: 3.3 g/dL — ABNORMAL LOW (ref 3.5–5.2)
Alkaline Phosphatase: 60 U/L (ref 39–117)
BUN: 18 mg/dL (ref 6–23)
CALCIUM: 9.5 mg/dL (ref 8.4–10.5)
CO2: 22 meq/L (ref 19–32)
CREATININE: 0.87 mg/dL (ref 0.50–1.10)
Chloride: 98 mEq/L (ref 96–112)
GFR, EST AFRICAN AMERICAN: 75 mL/min — AB (ref >90)
GFR, EST NON AFRICAN AMERICAN: 65 mL/min — AB (ref >90)
Glucose, Bld: 265 mg/dL — ABNORMAL HIGH (ref 70–99)
Potassium: 4.5 mEq/L (ref 3.7–5.3)
Sodium: 136 mEq/L — ABNORMAL LOW (ref 137–147)
Total Bilirubin: 0.4 mg/dL (ref 0.3–1.2)
Total Protein: 6.4 g/dL (ref 6.0–8.3)

## 2014-08-24 LAB — PREPARE RBC (CROSSMATCH)

## 2014-08-24 LAB — MAGNESIUM: MAGNESIUM: 1.2 mg/dL — AB (ref 1.5–2.5)

## 2014-08-24 LAB — ABO/RH: ABO/RH(D): O POS

## 2014-08-24 MED ORDER — ACETAMINOPHEN 325 MG PO TABS
650.0000 mg | ORAL_TABLET | Freq: Once | ORAL | Status: AC
  Administered 2014-08-24: 650 mg via ORAL
  Filled 2014-08-24: qty 2

## 2014-08-24 MED ORDER — DIPHENHYDRAMINE HCL 25 MG PO CAPS
25.0000 mg | ORAL_CAPSULE | Freq: Once | ORAL | Status: AC
  Administered 2014-08-24: 25 mg via ORAL
  Filled 2014-08-24: qty 1

## 2014-08-24 MED ORDER — HEPARIN SOD (PORK) LOCK FLUSH 100 UNIT/ML IV SOLN
INTRAVENOUS | Status: AC
  Filled 2014-08-24: qty 5

## 2014-08-24 MED ORDER — GABAPENTIN 300 MG PO CAPS: 600.0000 mg | ORAL_CAPSULE | Freq: Three times a day (TID) | ORAL | Status: DC

## 2014-08-24 MED ORDER — SODIUM CHLORIDE 0.9 % IV SOLN
250.0000 mL | Freq: Once | INTRAVENOUS | Status: AC
  Administered 2014-08-24: 11:00:00 via INTRAVENOUS

## 2014-08-24 MED ORDER — HEPARIN SOD (PORK) LOCK FLUSH 100 UNIT/ML IV SOLN
500.0000 [IU] | Freq: Once | INTRAVENOUS | Status: AC
  Administered 2014-08-24: 500 [IU] via INTRAVENOUS

## 2014-08-24 NOTE — Progress Notes (Unsigned)
Patient had previously reported PN, upon arrival to clinic today pt. Was assisted to exam room by staff member, pt was off balance and unable to walk unaided.  Dr. Barnet Glasgow notified. Pt has a office visit scheduled today with Pa Kefalas (he was notified as well). Patient tolerated 2 units of RBC's well. Port flushed after blood transfusion. W/ 500 units of heparin.   Patient taken to car in wheelchair.

## 2014-08-24 NOTE — Progress Notes (Signed)
Referral sent to [facility] for [place].  Faxed records on 10/14

## 2014-08-24 NOTE — Patient Instructions (Addendum)
[facility] Discharge Instructions  RECOMMENDATIONS MADE BY THE CONSULTANT AND ANY TEST RESULTS WILL BE SENT TO YOUR REFERRING PHYSICIAN. We will hold chemotherapy today.  You were given 2 units of red blood cells.  Referral to radiation therapy they will contact you for appointment times. Increase gabapentin to 600mg  three times a day. Follow up with the clinic in 3weeks. Please call for any questions or concerns.   Thank you for choosing [facility] to provide your oncology and hematology care.  To afford each patient quality time with our providers, please arrive at least 15 minutes before your scheduled appointment time.  With your help, our goal is to use those 15 minutes to complete the necessary work-up to ensure our physicians have the information they need to help with your evaluation and healthcare recommendations.    Effective January 1st, 2014, we ask that you re-schedule your appointment with our physicians should you arrive 10 or more minutes late for your appointment.  We strive to give you quality time with our providers, and arriving late affects you and other patients whose appointments are after yours.    Again, thank you for choosing [facility].  Our hope is that these requests will decrease the amount of time that you wait before being seen by our physicians.       _____________________________________________________________  Should you have questions after your visit to [facility], please contact our office at [phone] between the hours of 8:30 a.m. and 4:30 p.m.  Voicemails left after 4:30 p.m. will not be returned until the following business day.  For prescription refill requests, have your pharmacy contact our office with your prescription refill request.    _______________________________________________________________  We hope that we have given you very good care.  You may receive a patient  satisfaction survey in the mail, please complete it and return it as soon as possible.  We value your feedback!  _______________________________________________________________  Have you asked about our STAR program?  STAR stands for Survivorship Training and Rehabilitation, and this is a nationally recognized cancer care program that focuses on survivorship and rehabilitation.  Cancer and cancer treatments may cause problems, such as, pain, making you feel tired and keeping you from doing the things that you need or want to do. Cancer rehabilitation can help. Our goal is to reduce these troubling effects and help you have the best quality of life possible.  You may receive a survey from a nurse that asks questions about your current state of health.  Based on the survey results, all eligible patients will be referred to the [facility] program for an evaluation so we can better serve you!  A frequently asked questions sheet is available upon request.

## 2014-08-25 LAB — CA 125: CA 125: 17 U/mL (ref <35)

## 2014-08-25 LAB — TYPE AND SCREEN
ABO/RH(D): O POS
Antibody Screen: NEGATIVE
Unit division: 0
Unit division: 0

## 2014-08-29 LAB — SOLUBLE TRANSFERRIN RECEPTOR: Transferrin Receptor, Soluble: 2.59 mg/L — ABNORMAL HIGH (ref 0.76–1.76)

## 2014-08-31 ENCOUNTER — Encounter (HOSPITAL_BASED_OUTPATIENT_CLINIC_OR_DEPARTMENT_OTHER): Payer: Medicare HMO

## 2014-08-31 ENCOUNTER — Inpatient Hospital Stay (HOSPITAL_COMMUNITY): Payer: Medicare HMO

## 2014-08-31 ENCOUNTER — Encounter (HOSPITAL_COMMUNITY): Payer: Self-pay

## 2014-08-31 VITALS — BP 145/73 | HR 90 | Temp 98.1°F | Resp 18

## 2014-08-31 DIAGNOSIS — C549 Malignant neoplasm of corpus uteri, unspecified: Secondary | ICD-10-CM | POA: Diagnosis not present

## 2014-08-31 DIAGNOSIS — D509 Iron deficiency anemia, unspecified: Secondary | ICD-10-CM

## 2014-08-31 LAB — SAMPLE TO BLOOD BANK

## 2014-08-31 LAB — CBC
HEMATOCRIT: 40.1 % (ref 36.0–46.0)
Hemoglobin: 13.2 g/dL (ref 12.0–15.0)
MCH: 30.1 pg (ref 26.0–34.0)
MCHC: 32.9 g/dL (ref 30.0–36.0)
MCV: 91.3 fL (ref 78.0–100.0)
PLATELETS: 155 10*3/uL (ref 150–400)
RBC: 4.39 MIL/uL (ref 3.87–5.11)
RDW: 16.9 % — AB (ref 11.5–15.5)
WBC: 5.9 10*3/uL (ref 4.0–10.5)

## 2014-08-31 MED ORDER — DARBEPOETIN ALFA-POLYSORBATE 500 MCG/ML IJ SOLN: 500.0000 ug | Freq: Once | INTRAMUSCULAR | Status: DC

## 2014-08-31 NOTE — Progress Notes (Signed)
Kristina Rodriguez presented for labwork. Labs per MD order drawn via Peripheral Line 25 gauge needle inserted in rt ac.  Good blood return present. Procedure without incident.  Needle removed intact. Patient tolerated procedure well.

## 2014-08-31 NOTE — Progress Notes (Signed)
Aranesp not given. Hemoglobin 13.2. Treatment parameters not met.

## 2014-08-31 NOTE — Patient Instructions (Addendum)
..[facility] Discharge Instructions  RECOMMENDATIONS MADE BY THE CONSULTANT AND ANY TEST RESULTS WILL BE SENT TO YOUR REFERRING PHYSICIAN. 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  MEDICATIONS PRESCRIBED:  Aranesp not given, hemoglobin 13.2.  INSTRUCTIONS/FOLLOW-UP: As scheduled Aranesp injection will be every 3 weeks if needed.  Thank you for choosing [facility] to provide your oncology and hematology care.  To afford each patient quality time with our providers, please arrive at least 15 minutes before your scheduled appointment time.  With your help, our goal is to use those 15 minutes to complete the necessary work-up to ensure our physicians have the information they need to help with your evaluation and healthcare recommendations.    Effective January 1st, 2014, we ask that you re-schedule your appointment with our physicians should you arrive 10 or more minutes late for your appointment.  We strive to give you quality time with our providers, and arriving late affects you and other patients whose appointments are after yours.    Again, thank you for choosing [facility].  Our hope is that these requests will decrease the amount of time that you wait before being seen by our physicians.       _____________________________________________________________  Should you have questions after your visit to [facility], please contact our office at [phone] between the hours of 8:30 a.m. and 4:30 p.m.  Voicemails left after 4:30 p.m. will not be returned until the following business day.  For prescription refill requests, have your pharmacy contact our office with your prescription refill request.     _______________________________________________________________  We hope that we have given you very good care.  You may receive a patient satisfaction survey in the mail, please complete it and return it as soon as possible.  We value your feedback!  _______________________________________________________________  Have you asked about our STAR program?  STAR stands for Survivorship Training and Rehabilitation, and this is a nationally recognized cancer care program that focuses on survivorship and rehabilitation.  Cancer and cancer treatments may cause problems, such as, pain, making you feel tired and keeping you from doing the things that you need or want to do. Cancer rehabilitation can help. Our goal is to reduce these troubling effects and help you have the best quality of life possible.  You may receive a survey from a nurse that asks questions about your current state of health.  Based on the survey results, all eligible patients will be referred to the [facility] program for an evaluation so we can better serve you!  A frequently asked questions sheet is available upon request. Darbepoetin Alfa injection What is this medicine? DARBEPOETIN ALFA (dar be POE e tin AL fa) helps your body make more red blood cells. It is used to treat anemia caused by chronic kidney failure and chemotherapy. This medicine may be used for other purposes; ask your health care provider or pharmacist if you have questions. COMMON BRAND NAME(S): Aranesp What should I tell my health care provider before I take this medicine? They need to know if you have any of these conditions: -blood clotting disorders or history of blood clots -cancer patient not on chemotherapy -cystic fibrosis -heart disease, such as angina, heart failure, or a history  of a heart attack -hemoglobin level of 12 g/dL or greater -high blood pressure -low levels of folate, iron, or vitamin B12 -seizures -an unusual or allergic reaction to  darbepoetin, erythropoietin, albumin, hamster proteins, latex, other medicines, foods, dyes, or preservatives -pregnant or trying to get pregnant -breast-feeding How should I use this medicine? This medicine is for injection into a vein or under the skin. It is usually given by a health care professional in a hospital or clinic setting. If you get this medicine at home, you will be taught how to prepare and give this medicine. Do not shake the solution before you withdraw a dose. Use exactly as directed. Take your medicine at regular intervals. Do not take your medicine more often than directed. It is important that you put your used needles and syringes in a special sharps container. Do not put them in a trash can. If you do not have a sharps container, call your pharmacist or healthcare provider to get one. Talk to your pediatrician regarding the use of this medicine in children. While this medicine may be used in children as young as 1 year for selected conditions, precautions do apply. Overdosage: If you think you have taken too much of this medicine contact a poison control center or emergency room at once. NOTE: This medicine is only for you. Do not share this medicine with others. What if I miss a dose? If you miss a dose, take it as soon as you can. If it is almost time for your next dose, take only that dose. Do not take double or extra doses. What may interact with this medicine? Do not take this medicine with any of the following medications: -epoetin alfa This list may not describe all possible interactions. Give your health care provider a list of all the medicines, herbs, non-prescription drugs, or dietary supplements you use. Also tell them if you smoke, drink alcohol, or use illegal drugs. Some items may interact with your medicine. What should I watch for while using this medicine? Visit your prescriber or health care professional for regular checks on your progress and for the  needed blood tests and blood pressure measurements. It is especially important for the doctor to make sure your hemoglobin level is in the desired range, to limit the risk of potential side effects and to give you the best benefit. Keep all appointments for any recommended tests. Check your blood pressure as directed. Ask your doctor what your blood pressure should be and when you should contact him or her. As your body makes more red blood cells, you may need to take iron, folic acid, or vitamin B supplements. Ask your doctor or health care provider which products are right for you. If you have kidney disease continue dietary restrictions, even though this medication can make you feel better. Talk with your doctor or health care professional about the foods you eat and the vitamins that you take. What side effects may I notice from receiving this medicine? Side effects that you should report to your doctor or health care professional as soon as possible: -allergic reactions like skin rash, itching or hives, swelling of the face, lips, or tongue -breathing problems -changes in vision -chest pain -confusion, trouble speaking or understanding -feeling faint or lightheaded, falls -high blood pressure -muscle aches or pains -pain, swelling, warmth in the leg -rapid weight gain -severe headaches -sudden numbness or weakness of the face, arm or leg -trouble walking, dizziness, loss of balance or coordination -  seizures (convulsions) -swelling of the ankles, feet, hands -unusually weak or tired Side effects that usually do not require medical attention (report to your doctor or health care professional if they continue or are bothersome): -diarrhea -fever, chills (flu-like symptoms) -headaches -nausea, vomiting -redness, stinging, or swelling at site where injected This list may not describe all possible side effects. Call your doctor for medical advice about side effects. You may report side  effects to FDA at 1-800-FDA-1088. Where should I keep my medicine? Keep out of the reach of children. Store in a refrigerator between 2 and 8 degrees C (36 and 46 degrees F). Do not freeze. Do not shake. Throw away any unused portion if using a single-dose vial. Throw away any unused medicine after the expiration date. NOTE: This sheet is a summary. It may not cover all possible information. If you have questions about this medicine, talk to your doctor, pharmacist, or health care provider.  2015, [employer] Standard. (2008-10-10 10:23:57) Fall Prevention in Hospitals As a hospital patient, your condition and the treatments you receive can increase your risk for falls. Some additional risk factors for falls in a hospital include:  Being in an unfamiliar environment.  Being on bed rest.  Your surgery.  Taking certain medicines.  Your tubing requirements, such as intravenous (IV) therapy or catheters. It is important that you learn how to decrease fall risks while at the hospital. Below are important tips that can help prevent falls. SAFETY TIPS FOR PREVENTING FALLS Talk about your risk of falling.  Ask your caregiver why you are at risk for falling. Is it your medicine, illness, tubing placement, or something else?  Make a plan with your caregiver to keep you safe from falls.  Ask your caregiver or pharmacist about side effect of your medicines. Some medicines can make you dizzy or affect your coordination. Ask for help.  Ask for help before getting out of bed. You may need to press your call button.  Ask for assistance in getting you safely to the toilet.  Ask for a walker or cane to be put at your bedside. Ask that most of the side rails on your bed be placed up before your caregiver leaves the room.  Ask family or friends to sit with you.  Ask for things that are out of your reach, such as your glasses, hearing aids, telephone, bedside table, or call button. Follow these  tips to avoid falling:  Stay lying or seated, rather than standing, while waiting for help.  Wear rubber-soled slippers or shoes whenever you walk in the hospital.  Avoid quick, sudden movements.  Change positions slowly.  Sit on the side of your bed before standing.  Stand up slowly and wait before you start to walk.  Let your caregiver know if there is a spill on the floor.  Pay careful attention to the medical equipment, electrical cords, and tubes around you.  When you need help, use your call button by your bed or in the bathroom. Wait for one of your caregivers to help you.  If you feel dizzy or unsure of your footing, return to bed and wait for assistance.  Avoid being distracted by the TV, telephone, or another person in your room.  Do not lean or support yourself on rolling objects, such as IV poles or bedside tables. Document Released: 10/24/2000 Document Revised: 10/13/2012 Document Reviewed: 07/04/2012 [facility] Patient Information 2015 [place], [place]. This information is not intended to replace advice given to you by  your health care provider. Make sure you discuss any questions you have with your health care provider. Fall Prevention in Hospitals As a hospital patient, your condition and the treatments you receive can increase your risk for falls. Some additional risk factors for falls in a hospital include:  Being in an unfamiliar environment.  Being on bed rest.  Your surgery.  Taking certain medicines.  Your tubing requirements, such as intravenous (IV) therapy or catheters. It is important that you learn how to decrease fall risks while at the hospital. Below are important tips that can help prevent falls. SAFETY TIPS FOR PREVENTING FALLS Talk about your risk of falling.  Ask your caregiver why you are at risk for falling. Is it your medicine, illness, tubing placement, or something else?  Make a plan with your caregiver to keep you safe from  falls.  Ask your caregiver or pharmacist about side effect of your medicines. Some medicines can make you dizzy or affect your coordination. Ask for help.  Ask for help before getting out of bed. You may need to press your call button.  Ask for assistance in getting you safely to the toilet.  Ask for a walker or cane to be put at your bedside. Ask that most of the side rails on your bed be placed up before your caregiver leaves the room.  Ask family or friends to sit with you.  Ask for things that are out of your reach, such as your glasses, hearing aids, telephone, bedside table, or call button. Follow these tips to avoid falling:  Stay lying or seated, rather than standing, while waiting for help.  Wear rubber-soled slippers or shoes whenever you walk in the hospital.  Avoid quick, sudden movements.  Change positions slowly.  Sit on the side of your bed before standing.  Stand up slowly and wait before you start to walk.  Let your caregiver know if there is a spill on the floor.  Pay careful attention to the medical equipment, electrical cords, and tubes around you.  When you need help, use your call button by your bed or in the bathroom. Wait for one of your caregivers to help you.  If you feel dizzy or unsure of your footing, return to bed and wait for assistance.  Avoid being distracted by the TV, telephone, or another person in your room.  Do not lean or support yourself on rolling objects, such as IV poles or bedside tables. Document Released: 10/24/2000 Document Revised: 10/13/2012 Document Reviewed: 07/04/2012 [facility] Patient Information 2015 [place], [place]. This information is not intended to replace advice given to you by your health care provider. Make sure you discuss any questions you have with your health care provider.

## 2014-09-07 ENCOUNTER — Ambulatory Visit (HOSPITAL_COMMUNITY): Payer: Medicare HMO

## 2014-09-08 ENCOUNTER — Encounter (HOSPITAL_BASED_OUTPATIENT_CLINIC_OR_DEPARTMENT_OTHER): Payer: Medicare HMO | Admitting: Oncology

## 2014-09-08 VITALS — BP 134/77 | HR 108 | Temp 99.4°F | Resp 18

## 2014-09-08 DIAGNOSIS — R29898 Other symptoms and signs involving the musculoskeletal system: Secondary | ICD-10-CM

## 2014-09-08 DIAGNOSIS — M542 Cervicalgia: Secondary | ICD-10-CM

## 2014-09-08 DIAGNOSIS — R509 Fever, unspecified: Secondary | ICD-10-CM

## 2014-09-08 DIAGNOSIS — S161XXA Strain of muscle, fascia and tendon at neck level, initial encounter: Secondary | ICD-10-CM

## 2014-09-08 MED ORDER — CEPHALEXIN 500 MG PO CAPS: 500.0000 mg | ORAL_CAPSULE | Freq: Three times a day (TID) | ORAL | Status: DC

## 2014-09-08 MED ORDER — OXYCODONE HCL 5 MG PO TABS: ORAL_TABLET | ORAL | Status: DC

## 2014-09-08 MED ORDER — CYCLOBENZAPRINE HCL 10 MG PO TABS: 10.0000 mg | ORAL_TABLET | Freq: Three times a day (TID) | ORAL | Status: DC | PRN

## 2014-09-08 NOTE — Progress Notes (Signed)
Donnarae is seen as a work in today after she called the clinic and discussed some discomfort associated with her left chest wall and neck. The nurse was concerned that this was Port-A-Cath related and therefore I recommend the patient come in for evaluation.  The patient is a very poor historian and is very difficult to assess the sequence of actions/issues associated with this acute problem. From the best of my understanding here is her story: On Tuesday after awakening, she noticed a left neck pain and she thought it was an "crick" in my neck. This progressively got worse with radiation into the left side of head, left neck, left shoulder, and left chest wall. She appreciates tenderness on palpation. She notes significant range of motion in the neck. Her Port-A-Cath is on the right side of chest. She otherwise denies any complaints associated with his acute issue.  Physical exam: BP 134/77  Pulse 108  Temp(Src) 99.4 F (37.4 C) (Oral)  Resp 18  SpO2 95% Gen.: Patient is pleasant. She does not appear to be in acute distress. HEENT: Atraumatic and normocephalic Neck: Extreme tenderness to palpation limiting examination. No lymphadenopathy noted. Tenderness some muscles prohibited due to discomfort. Shoulders: left-sided edema/swelling compared to right side. Chest wall: Left chest wall discomfort on palpation with left-sided swelling compared to right side. Heat to palpation appreciated. Port-A-Cath: Intact without any erythema or pain on palpation. Cardiac: Regular rate and rhythm without murmur rub or gallop. Lungs: Clear to auscultation bilaterally. Ears: Tympanic membranes pearly white bilaterally without any erythema. Both canals are clear without erythema or pain. Mouth: No posterior pharynx erythema. No thrush. No abnormalities on inspection. Neuro: No focal deficits and patient is alert and oriented 3.  Assessment: 1. Musculoskeletal strain resulting in discomfort. 2. Low-grade fever  possibly related to #1. 3. Decreased neck range of motion. 4. Neck pain 5. Left-sided chest wall pain.  Plan: 1. Prescription for Keflex 500 mg 3 times daily 7 days for low-grade fever. Unlikely this is really an infection, but given the fact she was recently treated and a low-grade fever I will cover for infection. 2. Prescription for Flexeril 10 mg every 8 hours as needed for muscle relaxation. 3. Recommend she take ibuprofen 800 mg 3 times daily scheduled 4. Continue Nexium as ordered 5. Refill on pain medication  6. Follow-up with radiation oncology as planned 7. Call the [facility] on Monday for an update regarding condition. If progressive over weekend, recommend emergency department visit. 8. Otherwise, follow-up as planned.  Patient and plan discussed with Dr. Farrel Gobble and he is in agreement with the aforementioned.   KEFALAS,THOMAS 09/08/2014

## 2014-09-08 NOTE — Progress Notes (Signed)
Patient's biggest complaint today is with her neck pain x 3 days. She woke up on Tuesday morning with pain in her LT lateral neck that is now radiating around to posterior lower neck and to the top of head. Lt neck area appears slightly puffy. Patient can only tolerate light pressure to neck. She states that she can't turn neck to the RT but just a tiny bit. Tom to see pt.

## 2014-09-08 NOTE — Patient Instructions (Addendum)
[facility] Discharge Instructions  RECOMMENDATIONS MADE BY THE CONSULTANT AND ANY TEST RESULTS WILL BE SENT TO YOUR REFERRING PHYSICIAN.  EXAM FINDINGS BY THE PHYSICIAN TODAY AND SIGNS OR SYMPTOMS TO REPORT TO CLINIC OR PRIMARY PHYSICIAN:   ---------------------------------------------------------------------------------------------------------------------------------------------------------------------------------------------------------------------------------------------------------------------------------------------------------------------------  Keflex antibiotic - take as directed - take until gone!!! You will take 1 tablet three times a day. We are giving this to you for your temperature of 99.4.   Start back taking Ibuprofen 800mg  three times a day x 7 days. Take with food.   Start Flexeril 10mg . Take it three times a day as needed for muscle spasms/muscle pain. Flexeril can make you drowsy.  If you need it - take it.   Continue taking your Nexium daily.   You can purchase Senna or Senokot S at the drug store. This is an over-the-counter medication. You may take up to 4 tablets daily if needed. Try starting with 1-2 tablets to see how this works for you. This will help you to have a bowel movement. Stool softeners such as Colace (docusate sodium) just make your stool softer.   You can continue to take your Oxycodone for pain. We have given you a prescription for this.   Call us on Monday and let us know how you are doing. [facility] [address]  If your pain gets worse or you don't seem to be improving - please come to Emergency  Room!!!!  ---------------------------------------------------------------------------------------------------------------------------------------------------------------------------------------------------------------------------------------------------------------------------------------------------------------------------  Thank you for choosing [facility] to provide your oncology and hematology care.  To afford each patient quality time with our providers, please arrive at least 15 minutes before your scheduled appointment time.  With your help, our goal is to use those 15 minutes to complete the necessary work-up to ensure our physicians have the information they need to help with your evaluation and healthcare recommendations.    Effective January 1st, 2014, we ask that you re-schedule your appointment with our physicians should you arrive 10 or more minutes late for your appointment.  We strive to give you quality time with our providers, and arriving late affects you and other patients whose appointments are after yours.    Again, thank you for choosing [facility].  Our hope is that these requests will decrease the amount of time that you wait before being seen by our physicians.       _____________________________________________________________  Should you have questions after your visit to [facility], please contact our office at [phone] between the hours of 8:30 a.m. and 5:00 p.m.  Voicemails left after 4:30 p.m. will not be returned until the following business day.  For prescription refill requests, have your pharmacy contact our office with your prescription refill request.    Cephalexin tablets or capsules What is this medicine? CEPHALEXIN (sef a LEX in) is a cephalosporin antibiotic. It is used to treat certain kinds of bacterial infections It will not work for colds, flu, or other viral infections. This medicine may be used for other  purposes; ask your health care provider or pharmacist if you have questions. COMMON BRAND NAME(S): Biocef, Keflex, Keftab What should I tell my health care provider before I take this medicine? They need to know if you have any of these conditions: -kidney disease -stomach or intestine problems, especially colitis -an unusual or allergic reaction to cephalexin, other cephalosporins, penicillins, other antibiotics, medicines, foods, dyes or preservatives -pregnant or trying to  get pregnant -breast-feeding How should I use this medicine? Take this medicine by mouth with a full glass of water. Follow the directions on the prescription label. This medicine can be taken with or without food. Take your medicine at regular intervals. Do not take your medicine more often than directed. Take all of your medicine as directed even if you think you are better. Do not skip doses or stop your medicine early. Talk to your pediatrician regarding the use of this medicine in children. While this drug may be prescribed for selected conditions, precautions do apply. Overdosage: If you think you have taken too much of this medicine contact a poison control center or emergency room at once. NOTE: This medicine is only for you. Do not share this medicine with others. What if I miss a dose? If you miss a dose, take it as soon as you can. If it is almost time for your next dose, take only that dose. Do not take double or extra doses. There should be at least 4 to 6 hours between doses. What may interact with this medicine? -probenecid -some other antibiotics This list may not describe all possible interactions. Give your health care provider a list of all the medicines, herbs, non-prescription drugs, or dietary supplements you use. Also tell them if you smoke, drink alcohol, or use illegal drugs. Some items may interact with your medicine. What should I watch for while using this medicine? Tell your doctor or health  care professional if your symptoms do not begin to improve in a few days. Do not treat diarrhea with over the counter products. Contact your doctor if you have diarrhea that lasts more than 2 days or if it is severe and watery. If you have diabetes, you may get a false-positive result for sugar in your urine. Check with your doctor or health care professional. What side effects may I notice from receiving this medicine? Side effects that you should report to your doctor or health care professional as soon as possible: -allergic reactions like skin rash, itching or hives, swelling of the face, lips, or tongue -breathing problems -pain or trouble passing urine -redness, blistering, peeling or loosening of the skin, including inside the mouth -severe or watery diarrhea -unusually weak or tired -yellowing of the eyes, skin Side effects that usually do not require medical attention (report to your doctor or health care professional if they continue or are bothersome): -gas or heartburn -genital or anal irritation -headache -joint or muscle pain -nausea, vomiting This list may not describe all possible side effects. Call your doctor for medical advice about side effects. You may report side effects to FDA at 1-800-FDA-1088. Where should I keep my medicine? Keep out of the reach of children. Store at room temperature between 59 and 86 degrees F (15 and 30 degrees C). Throw away any unused medicine after the expiration date. NOTE: This sheet is a summary. It may not cover all possible information. If you have questions about this medicine, talk to your doctor, pharmacist, or health care provider.  2015, Elsevier/Gold Standard. (2008-01-31 17:09:13) Cyclobenzaprine tablets What is this medicine? CYCLOBENZAPRINE (sye kloe BEN za preen) is a muscle relaxer. It is used to treat muscle pain, spasms, and stiffness. This medicine may be used for other purposes; ask your health care provider or  pharmacist if you have questions. COMMON BRAND NAME(S): Fexmid, Flexeril What should I tell my health care provider before I take this medicine? They need to know if you  have any of these conditions: -heart disease, irregular heartbeat, or previous heart attack -liver disease -thyroid problem -an unusual or allergic reaction to cyclobenzaprine, tricyclic antidepressants, lactose, other medicines, foods, dyes, or preservatives -pregnant or trying to get pregnant -breast-feeding How should I use this medicine? Take this medicine by mouth with a glass of water. Follow the directions on the prescription label. If this medicine upsets your stomach, take it with food or milk. Take your medicine at regular intervals. Do not take it more often than directed. Talk to your pediatrician regarding the use of this medicine in children. Special care may be needed. Overdosage: If you think you have taken too much of this medicine contact a poison control center or emergency room at once. NOTE: This medicine is only for you. Do not share this medicine with others. What if I miss a dose? If you miss a dose, take it as soon as you can. If it is almost time for your next dose, take only that dose. Do not take double or extra doses. What may interact with this medicine? Do not take this medicine with any of the following medications: -certain medicines for fungal infections like fluconazole, itraconazole, ketoconazole, posaconazole, voriconazole -cisapride -dofetilide -dronedarone -droperidol -flecainide -grepafloxacin -halofantrine -levomethadyl -MAOIs like Carbex, Eldepryl, Marplan, Nardil, and Parnate -nilotinib -pimozide -probucol -sertindole -thioridazine -ziprasidone This medicine may also interact with the following medications: -abarelix -alcohol -certain medicines for cancer -certain medicines for depression, anxiety, or psychotic disturbances -certain medicines for infection like  alfuzosin, chloroquine, clarithromycin, levofloxacin, mefloquine, pentamidine, troleandomycin -certain medicines for an irregular heart beat -certain medicines used for sleep or numbness during surgery or procedure -contrast dyes -dolasetron -guanethidine -methadone -octreotide -ondansetron -other medicines that prolong the QT interval (cause an abnormal heart rhythm) -palonosetron -phenothiazines like chlorpromazine, mesoridazine, prochlorperazine, thioridazine -tramadol -vardenafil This list may not describe all possible interactions. Give your health care provider a list of all the medicines, herbs, non-prescription drugs, or dietary supplements you use. Also tell them if you smoke, drink alcohol, or use illegal drugs. Some items may interact with your medicine. What should I watch for while using this medicine? Check with your doctor or health care professional if your condition does not improve within 1 to 3 weeks. You may get drowsy or dizzy when you first start taking the medicine or change doses. Do not drive, use machinery, or do anything that may be dangerous until you know how the medicine affects you. Stand or sit up slowly. Your mouth may get dry. Drinking water, chewing sugarless gum, or sucking on hard candy may help. What side effects may I notice from receiving this medicine? Side effects that you should report to your doctor or health care professional as soon as possible: -allergic reactions like skin rash, itching or hives, swelling of the face, lips, or tongue -chest pain -fast heartbeat -hallucinations -seizures -vomiting Side effects that usually do not require medical attention (report to your doctor or health care professional if they continue or are bothersome): -headache This list may not describe all possible side effects. Call your doctor for medical advice about side effects. You may report side effects to FDA at 1-800-FDA-1088. Where should I keep my  medicine? Keep out of the reach of children. Store at room temperature between 15 and 30 degrees C (59 and 86 degrees F). Keep container tightly closed. Throw away any unused medicine after the expiration date. NOTE: This sheet is a summary. It may not cover all possible information. If  you have questions about this medicine, talk to your doctor, pharmacist, or health care provider.  2015, Elsevier/Gold Standard. (2013-05-24 12:48:19) Ibuprofen tablets and capsules What is this medicine? IBUPROFEN (eye BYOO proe fen) is a non-steroidal anti-inflammatory drug (NSAID). It is used for dental pain, fever, headaches or migraines, osteoarthritis, rheumatoid arthritis, or painful monthly periods. It can also relieve minor aches and pains caused by a cold, flu, or sore throat. This medicine may be used for other purposes; ask your health care provider or pharmacist if you have questions. COMMON BRAND NAME(S): Advil, Advil Junior Strength, Advil Migraine, Genpril, Ibren, IBU, Midol, Midol Cramps and Body Aches, Motrin, Motrin IB, Motrin Junior Strength, Motrin Migraine Pain, Samson-8, Toxicology Saliva Collection What should I tell my health care provider before I take this medicine? They need to know if you have any of these conditions: -asthma -cigarette smoker -drink more than 3 alcohol containing drinks a day -heart disease or circulation problems such as heart failure or leg edema (fluid retention) -high blood pressure -kidney disease -liver disease -stomach bleeding or ulcers -an unusual or allergic reaction to ibuprofen, aspirin, other NSAIDS, other medicines, foods, dyes, or preservatives -pregnant or trying to get pregnant -breast-feeding How should I use this medicine? Take this medicine by mouth with a glass of water. Follow the directions on the prescription label. Take this medicine with food if your stomach gets upset. Try to not lie down for at least 10 minutes after you take the  medicine. Take your medicine at regular intervals. Do not take your medicine more often than directed. A special MedGuide will be given to you by the pharmacist with each prescription and refill. Be sure to read this information carefully each time. Talk to your pediatrician regarding the use of this medicine in children. Special care may be needed. Overdosage: If you think you have taken too much of this medicine contact a poison control center or emergency room at once. NOTE: This medicine is only for you. Do not share this medicine with others. What if I miss a dose? If you miss a dose, take it as soon as you can. If it is almost time for your next dose, take only that dose. Do not take double or extra doses. What may interact with this medicine? Do not take this medicine with any of the following medications: -cidofovir -ketorolac -methotrexate -pemetrexed This medicine may also interact with the following medications: -alcohol -aspirin -diuretics -lithium -other drugs for inflammation like prednisone -warfarin This list may not describe all possible interactions. Give your health care provider a list of all the medicines, herbs, non-prescription drugs, or dietary supplements you use. Also tell them if you smoke, drink alcohol, or use illegal drugs. Some items may interact with your medicine. What should I watch for while using this medicine? Tell your doctor or healthcare professional if your symptoms do not start to get better or if they get worse. This medicine does not prevent heart attack or stroke. In fact, this medicine may increase the chance of a heart attack or stroke. The chance may increase with longer use of this medicine and in people who have heart disease. If you take aspirin to prevent heart attack or stroke, talk with your doctor or health care professional. Do not take other medicines that contain aspirin, ibuprofen, or naproxen with this medicine. Side effects such as  stomach upset, nausea, or ulcers may be more likely to occur. Many medicines available without a prescription should not be  taken with this medicine. This medicine can cause ulcers and bleeding in the stomach and intestines at any time during treatment. Ulcers and bleeding can happen without warning symptoms and can cause death. To reduce your risk, do not smoke cigarettes or drink alcohol while you are taking this medicine. You may get drowsy or dizzy. Do not drive, use machinery, or do anything that needs mental alertness until you know how this medicine affects you. Do not stand or sit up quickly, especially if you are an older patient. This reduces the risk of dizzy or fainting spells. This medicine can cause you to bleed more easily. Try to avoid damage to your teeth and gums when you brush or floss your teeth. This medicine may be used to treat migraines. If you take migraine medicines for 10 or more days a month, your migraines may get worse. Keep a diary of headache days and medicine use. Contact your healthcare professional if your migraine attacks occur more frequently. What side effects may I notice from receiving this medicine? Side effects that you should report to your doctor or health care professional as soon as possible: -allergic reactions like skin rash, itching or hives, swelling of the face, lips, or tongue -severe stomach pain -signs and symptoms of bleeding such as bloody or black, tarry stools; red or dark-brown urine; spitting up blood or brown material that looks like coffee grounds; red spots on the skin; unusual bruising or bleeding from the eye, gums, or nose -signs and symptoms of a blood clot such as changes in vision; chest pain; severe, sudden headache; trouble speaking; sudden numbness or weakness of the face, arm, or leg -unexplained weight gain or swelling -unusually weak or tired -yellowing of eyes or skin Side effects that usually do not require medical attention  (report to your doctor or health care professional if they continue or are bothersome): -bruising -diarrhea -dizziness, drowsiness -headache -nausea, vomiting This list may not describe all possible side effects. Call your doctor for medical advice about side effects. You may report side effects to FDA at 1-800-FDA-1088. Where should I keep my medicine? Keep out of the reach of children. Store at room temperature between 15 and 30 degrees C (59 and 86 degrees F). Keep container tightly closed. Throw away any unused medicine after the expiration date. NOTE: This sheet is a summary. It may not cover all possible information. If you have questions about this medicine, talk to your doctor, pharmacist, or health care provider.  2015, Elsevier/Gold Standard. (2013-06-28 10:48:02) Muscle Strain A muscle strain (pulled muscle) happens when a muscle is stretched beyond normal length. It happens when a sudden, violent force stretches your muscle too far. Usually, a few of the fibers in your muscle are torn. Muscle strain is common in athletes. Recovery usually takes 1-2 weeks. Complete healing takes 5-6 weeks.  HOME CARE   Follow the PRICE method of treatment to help your injury get better. Do this the first 2-3 days after the injury:  Protect. Protect the muscle to keep it from getting injured again.  Rest. Limit your activity and rest the injured body part.  Ice. Put ice in a plastic bag. Place a towel between your skin and the bag. Then, apply the ice and leave it on from 15-20 minutes each hour. After the third day, switch to moist heat packs.  Compression. Use a splint or elastic bandage on the injured area for comfort. Do not put it on too tightly.  Elevate. Keep  the injured body part above the level of your heart.  Only take medicine as told by your doctor.  Warm up before doing exercise to prevent future muscle strains. GET HELP IF:   You have more pain or puffiness (swelling) in the  injured area.  You feel numbness, tingling, or notice a loss of strength in the injured area. MAKE SURE YOU:   Understand these instructions.  Will watch your condition.  Will get help right away if you are not doing well or get worse. Document Released: 08/05/2008 Document Revised: 08/17/2013 Document Reviewed: 05/26/2013 [facility] Patient Information 2015 [place], [place]. This information is not intended to replace advice given to you by your health care provider. Make sure you discuss any questions you have with your health care provider. Senna tablets What is this medicine? SENNA (SEN uh) is a laxative. This medicine is used to relieve constipation. It may also be used to empty and prepare the bowel for surgery or examination. This medicine may be used for other purposes; ask your health care provider or pharmacist if you have questions. COMMON BRAND NAME(S): Black Draught, Ex-Lax, Lax-Pills, Perdiem, Senexon, Senna, Senna-Lax, Senna-Tabs, [place], Sennatural, Senokot, Senokot Xtra, SenoSol, SenoSol-X, Uni-Cenna What should I tell my health care provider before I take this medicine? They need to know if you have any of these conditions -appendicitis -stomach pain or blockage -vomiting -an unusual or allergic reaction to senna, other medicines, foods, dyes, or preservatives -pregnant or trying to get pregnant -breast-feeding How should I use this medicine? Take this medicine by mouth with a full glass of water. Follow the directions on the package. Always take with plenty of water. Take exactly as directed. Do not take your medicine more often than directed. Talk to your pediatrician regarding the use of this medicine in children. While this medicine may be prescribed for children as young as 2 years for selected conditions, precautions do apply. Overdosage: If you think you have taken too much of this medicine contact a poison control center or emergency room at once. NOTE: This  medicine is only for you. Do not share this medicine with others. What if I miss a dose? If you miss a dose, take it as soon as you can. If it is almost time for your next dose, take only that dose. Do not take double or extra doses. What may interact with this medicine? Interactions are not expected. However, this medicine may affect the time other medicines stay in the stomach. It is best not to take this medicine within 1 to 2 hours of taking other medicines. This list may not describe all possible interactions. Give your health care provider a list of all the medicines, herbs, non-prescription drugs, or dietary supplements you use. Also tell them if you smoke, drink alcohol, or use illegal drugs. Some items may interact with your medicine. What should I watch for while using this medicine? Do not use this medicine for longer than directed by your doctor or health care professional. This medicine can be habit-forming. Long-term use can make your body depend on the laxative for regular bowel movements, damage the bowel, cause malnutrition, and problems with the amounts of water and salts in your body. If your constipation keeps returning, check with your doctor or health care professional. What side effects may I notice from receiving this medicine? Side effects that you should report to your doctor or health care professional as soon as possible: -diarrhea -muscle weakness -nausea, vomiting -unusual weight loss  Side effects that usually do not require medical attention (report to your doctor or health care professional if they continue or are bothersome): -bloating -discolored urine -lower stomach discomfort or cramps This list may not describe all possible side effects. Call your doctor for medical advice about side effects. You may report side effects to FDA at 1-800-FDA-1088. Where should I keep my medicine? Keep out of the reach of children. Store at room temperature between 15 and 30  degrees C (59 and 86 degrees F). Keep container tightly closed. Throw away any unused medicine after the expiration date. NOTE: This sheet is a summary. It may not cover all possible information. If you have questions about this medicine, talk to your doctor, pharmacist, or health care provider.  2015, Elsevier/Gold Standard. (2008-06-28 15:49:10)

## 2014-09-12 NOTE — Progress Notes (Signed)
Kristina Noble, MD [address] [place] [address]  Uterine endometrial cancer, sarcoma  CURRENT THERAPY: Radiation therapy  INTERVAL HISTORY: Kristina Rodriguez 73 y.o. female returns for  regular  visit for followup of carcinosarcoma of the uterus, stage III. She has undergone a previous TAH/BSO with lymphadenectomy.  Oncology History   73 year old woman with stage IIIA carcinosarcoma of the uterus (with microscopic ovarian involvement on final pathology, negative cervix, negative pelvic and para-aortic nodes).     Uterine endometrial cancer, sarcoma   06/12/2014 Initial Diagnosis Uterine endometrial cancer, sarcoma   06/16/2014 Definitive Surgery robotic hysterectomy, BSO, pelvic and para-aortic lymphadenectomy   07/13/2014 - 08/11/2014 Chemotherapy Paclitaxel and Carboplatin   08/24/2014 Adverse Reaction Significant peripheral neuropathy.  Chemotherapy held and cancelled.  She will move on to radiation therapy, sooner than planned.   I personally reviewed and went over laboratory results with the patient.  The results are noted within this dictation.  She reports that she was simulated for radiation therapy on Monday and she is waiting for Rad Onc to call her with an appointment to start.  I have requested the patient call us on 11/10 if she has not yet heard back from [facility] on a start date.  She continues to have issues from grade 2 peripheral neuropathy, chemotherapy-induced.  We will assess her for the STAR program.  Oncologically, she denies any complaints and ROS questioning is negative.  Past Medical History  Diagnosis Date  . Diabetes mellitus, type II   . Hypertension   . Hyperlipidemia   . Osteoarthritis   . Iron deficiency anemia     attributed to long-term treatment with a PPI  . Anxiety and depression   . Hepatic steatosis 2006    mild  . Cholelithiasis 2006    Acute and chronic cholecystitis; laparoscopic cholecystectomy in 2006  .  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 Chronic pain   . Collagen vascular disease   . Anxiety   . Uterine cancer 7/15    carcinosarcoma    has Diabetes mellitus, type II; Hypertension; Hyperlipidemia; Osteoarthritis; Iron deficiency anemia; Other chronic pain; Depressive disorder, not elsewhere classified; Screening for malignant neoplasm of the cervix; Uterine mass; Uterine endometrial cancer, sarcoma; and Hypomagnesemia on her problem list.     is allergic to codeine.  Ms. Passarelli does not currently have medications on file.  Past Surgical History  Procedure Laterality Date  . Tonsillectomy    . Carpal tunnel release  1994    Right  . Cholecystectomy, laparoscopic  2006    Dr. Romona Curls; cholelithiasis  . Colonoscopy  2003    Najeeb Rehman;iron deficiency anemia; normal study; hiatal hernia, gastritis, Schatzki's ring on EGD  . Breast biopsy  10/2012    Left; fibroadenoma  . Cholecystectomy    . Tubal ligation  1970s    removed  . Hysteroscopy w/d&c N/A 06/06/2014    Procedure: DILATATION AND CURETTAGE /HYSTEROSCOPY;  Surgeon: Jonnie Kind, MD;  Location: [facility];  Service: Gynecology;  Laterality: N/A;  . Polypectomy N/A 06/06/2014    Procedure: POLYPECTOMY;  Surgeon: Jonnie Kind, MD;  Location: [facility];  Service: Gynecology;  Laterality: N/A;  . Abdominal hysterectomy  06/16/14    robotic hysterectomy, BSO, pelvic and para-aortic lymphadenectomy for uterine carcinosarcoma    Denies any headaches, dizziness, double vision, fevers, chills, night sweats, nausea, vomiting, diarrhea, constipation, chest pain,  heart palpitations, shortness of breath, blood in stool, black tarry stool, urinary pain, urinary burning, urinary frequency, hematuria.   PHYSICAL EXAMINATION  ECOG PERFORMANCE STATUS: 1 - Symptomatic but completely ambulatory  Filed Vitals:   09/14/14 1150  BP: 111/65  Pulse: 95  Temp: 98.6 F (37 C)  Resp: 18    GENERAL:alert,  no distress, well nourished, well developed, comfortable, cooperative and smiling SKIN: skin color, texture, turgor are normal, no rashes or significant lesions HEAD: Normocephalic, No masses, lesions, tenderness or abnormalities EYES: normal, PERRLA, EOMI, Conjunctiva are pink and non-injected EARS: External ears normal OROPHARYNX:mucous membranes are moist  NECK: supple, trachea midline LYMPH:  no palpable lymphadenopathy BREAST:not examined LUNGS: clear to auscultation  HEART: regular rate & rhythm, no murmurs and no gallops ABDOMEN:abdomen soft and normal bowel sounds BACK: Back symmetric, no curvature. EXTREMITIES:less then 2 second capillary refill, no joint deformities, effusion, or inflammation, no skin discoloration, no cyanosis  NEURO: alert & oriented x 3 with fluent speech, no focal motor/sensory deficits, gait normal   LABORATORY DATA: CBC    Component Value Date/Time   WBC 5.9 08/31/2014 1130   RBC 4.39 08/31/2014 1130   RBC 4.05 01/26/2014 0920   HGB 13.2 08/31/2014 1130   HCT 40.1 08/31/2014 1130   PLT 155 08/31/2014 1130   MCV 91.3 08/31/2014 1130   MCH 30.1 08/31/2014 1130   MCHC 32.9 08/31/2014 1130   RDW 16.9* 08/31/2014 1130   LYMPHSABS 1.2 08/24/2014 0833   MONOABS 0.5 08/24/2014 0833   EOSABS 0.0 08/24/2014 0833   BASOSABS 0.0 08/24/2014 0833      Chemistry      Component Value Date/Time   NA 136* 08/24/2014 0833   K 4.5 08/24/2014 0833   CL 98 08/24/2014 0833   CO2 22 08/24/2014 0833   BUN 18 08/24/2014 0833   CREATININE 0.87 08/24/2014 0833      Component Value Date/Time   CALCIUM 9.5 08/24/2014 0833   ALKPHOS 60 08/24/2014 0833   AST 18 08/24/2014 0833   ALT 13 08/24/2014 0833   BILITOT 0.4 08/24/2014 0833     Lab Results  Component Value Date   CA125 17 08/24/2014      ASSESSMENT:  1. Stage III carcinosarcoma of the uterus, status post TAH, BSO, pelvic and para-aortic lymphadenectomy. Receiving systemic chemotherapy with  Carbo/Taxol. 2. Iron deficiency anemia responsive to IV Feraheme  3. 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 Anemia secondary to chemotherapy  5. Hypomagnesemia secondary to chemotherapy 6. Peripheral neuropathy, grade 3 7. Anemia secondary to chemotherapy.  Patient Active Problem List   Diagnosis Date Noted  . Hypomagnesemia 08/11/2014  . Uterine endometrial cancer, sarcoma 06/12/2014  . Screening for malignant neoplasm of the cervix 05/18/2014  . Uterine mass 05/18/2014  . Other chronic pain 05/17/2014  . Depressive disorder, not elsewhere classified 05/17/2014  . Diabetes mellitus, type II   . Hypertension   . Hyperlipidemia   . Osteoarthritis   . Iron deficiency anemia      PLAN:  1. I personally reviewed and went over laboratory results with the patient.  The results are noted within this dictation. 2. She will call us on Tuesday (11/10) if she has not heard back from Rad Onc. 3. Follow-up with Rad Onc as directed. 4. Return in 3 weeks for follow-up   THERAPY PLAN:  She will complete radiation therapy.  Depending on performance status and patient's clinical situation at the conclusion of radiation, further systemic  chemotherapy will be considered while being mindful of significant peripheral neuropathy with first 2 cycles of chemotherapy.  All questions were answered. The patient knows to call the clinic with any problems, questions or concerns. We can certainly see the patient much sooner if necessary.  Patient and plan discussed with Dr. Farrel Gobble and he is in agreement with the aforementioned.   KEFALAS,THOMAS 09/14/2014

## 2014-09-14 ENCOUNTER — Encounter (HOSPITAL_COMMUNITY): Payer: Commercial Managed Care - HMO | Attending: Oncology | Admitting: Oncology

## 2014-09-14 ENCOUNTER — Encounter (HOSPITAL_COMMUNITY): Payer: Self-pay | Admitting: Oncology

## 2014-09-14 VITALS — BP 111/65 | HR 95 | Temp 98.6°F | Resp 18 | Wt 153.8 lb

## 2014-09-14 DIAGNOSIS — C549 Malignant neoplasm of corpus uteri, unspecified: Secondary | ICD-10-CM

## 2014-09-14 DIAGNOSIS — G620 Drug-induced polyneuropathy: Secondary | ICD-10-CM

## 2014-09-14 DIAGNOSIS — D6481 Anemia due to antineoplastic chemotherapy: Secondary | ICD-10-CM

## 2014-09-14 DIAGNOSIS — D509 Iron deficiency anemia, unspecified: Secondary | ICD-10-CM

## 2014-09-14 DIAGNOSIS — C55 Malignant neoplasm of uterus, part unspecified: Secondary | ICD-10-CM | POA: Insufficient documentation

## 2014-09-14 NOTE — Progress Notes (Signed)
STAR Program Physical Impairment and Functional Assessment Screening Tool  1. Are you having any pain, including headaches, joint pain, or muscle pain (upper body = OT; lower body = PT)?  Yes, this started after my diagnosis and is still a problem.  2. Do your hands and/or feet feel numb or tingle (PT)?  Yes, this started after my diagnosis and is still a problem.  3. Does any part of your body feel swollen or larger than usual (upper body = OT; lower body = PT)?  Yes, this started after my diagnosis and is still a problem.  4. Are you so tired that you cannot do the things you want or need to do (PT or OT)?  No  5. Are you feeling weak or are you having trouble moving any part of your body (PT/OT)?  Yes, this started after my diagnosis and is still a problem.  6. Are you having trouble concentrating, thinking, or remembering things (OT/ST)?  Yes, this is new since my last visit.  7. Are you having trouble moving around or feel like you might trip or fall (PT)?  No  8. Are you having trouble swallowing (ST)?  No  9. Are you having trouble speaking (ST)?  No  10. Are you having trouble with going or getting to the bathroom (OT)?  No  11. Are you having trouble with your sexual function (OT)?  No  12. Are you having trouble lifting things, even just your arms (OT/PT)?  No  13. Are you having trouble taking care of yourself as in dressing or bathing (OT)?  No  14. Are you having trouble with daily tasks like chores or shopping (OT)?  Yes, but I hand this before my cancer diagnosis.  15. Are you having trouble driving (OT)?  No  16. Are you having trouble returning to work or completing your tasks at work (OT)?  No  Other concerns:    Legend: OT = Occupational Therapy PT = Physical Therapy ST = Speech Therapy

## 2014-09-14 NOTE — Addendum Note (Signed)
Addended by: Mellissa Kohut on: 09/14/2014 01:26 PM   Modules accepted: Orders

## 2014-09-14 NOTE — Patient Instructions (Signed)
[facility] Discharge Instructions  RECOMMENDATIONS MADE BY THE CONSULTANT AND ANY TEST RESULTS WILL BE SENT TO YOUR REFERRING PHYSICIAN.  Please call the [facility] on Tuesday (11/10) if you have not heard about future radiation appointment and we will follow-up with your radiation doctor. No labs needed today Please call us with any complaints that are related to your cancer: [phone] Please call radiation with any issues associated with your radiation therapy: [phone] Return in 3 weeks for follow-up Survey for STAR program filled out and if you qualify for cancer rehabilitation, you will be contacted.  Thank you for choosing [facility] to provide your oncology and hematology care.  To afford each patient quality time with our providers, please arrive at least 15 minutes before your scheduled appointment time.  With your help, our goal is to use those 15 minutes to complete the necessary work-up to ensure our physicians have the information they need to help with your evaluation and healthcare recommendations.    Effective January 1st, 2014, we ask that you re-schedule your appointment with our physicians should you arrive 10 or more minutes late for your appointment.  We strive to give you quality time with our providers, and arriving late affects you and other patients whose appointments are after yours.    Again, thank you for choosing [facility].  Our hope is that these requests will decrease the amount of time that you wait before being seen by our physicians.       _____________________________________________________________  Should you have questions after your visit to [facility], please contact our office at [phone] between the hours of 8:30 a.m. and 5:00 p.m.  Voicemails left after 4:30 p.m. will not be returned until the following business day.  For prescription refill requests, have your pharmacy  contact our office with your prescription refill request.

## 2014-09-21 ENCOUNTER — Encounter (HOSPITAL_COMMUNITY): Payer: Commercial Managed Care - HMO

## 2014-09-21 ENCOUNTER — Encounter (HOSPITAL_BASED_OUTPATIENT_CLINIC_OR_DEPARTMENT_OTHER): Payer: Commercial Managed Care - HMO

## 2014-09-21 DIAGNOSIS — C55 Malignant neoplasm of uterus, part unspecified: Secondary | ICD-10-CM | POA: Diagnosis present

## 2014-09-21 DIAGNOSIS — D509 Iron deficiency anemia, unspecified: Secondary | ICD-10-CM

## 2014-09-21 DIAGNOSIS — C549 Malignant neoplasm of corpus uteri, unspecified: Secondary | ICD-10-CM | POA: Diagnosis present

## 2014-09-21 LAB — CBC
HCT: 35.6 % — ABNORMAL LOW (ref 36.0–46.0)
HEMOGLOBIN: 11.9 g/dL — AB (ref 12.0–15.0)
MCH: 30.1 pg (ref 26.0–34.0)
MCHC: 33.4 g/dL (ref 30.0–36.0)
MCV: 90.1 fL (ref 78.0–100.0)
Platelets: 354 10*3/uL (ref 150–400)
RBC: 3.95 MIL/uL (ref 3.87–5.11)
RDW: 15.7 % — ABNORMAL HIGH (ref 11.5–15.5)
WBC: 6.3 10*3/uL (ref 4.0–10.5)

## 2014-09-21 NOTE — Progress Notes (Signed)
Hemoglobin 11.9 WNL parameters aranesp not needed

## 2014-09-21 NOTE — Progress Notes (Signed)
LABS FOR CBC 

## 2014-10-03 ENCOUNTER — Encounter (HOSPITAL_BASED_OUTPATIENT_CLINIC_OR_DEPARTMENT_OTHER): Payer: Commercial Managed Care - HMO

## 2014-10-03 ENCOUNTER — Encounter (HOSPITAL_COMMUNITY): Payer: Self-pay

## 2014-10-03 VITALS — BP 112/52 | HR 95 | Temp 98.5°F | Resp 18 | Wt 149.7 lb

## 2014-10-03 DIAGNOSIS — D509 Iron deficiency anemia, unspecified: Secondary | ICD-10-CM

## 2014-10-03 DIAGNOSIS — G620 Drug-induced polyneuropathy: Secondary | ICD-10-CM

## 2014-10-03 DIAGNOSIS — T451X5A Adverse effect of antineoplastic and immunosuppressive drugs, initial encounter: Secondary | ICD-10-CM

## 2014-10-03 DIAGNOSIS — C549 Malignant neoplasm of corpus uteri, unspecified: Secondary | ICD-10-CM

## 2014-10-03 DIAGNOSIS — G622 Polyneuropathy due to other toxic agents: Secondary | ICD-10-CM

## 2014-10-03 LAB — CBC
HEMATOCRIT: 34.8 % — AB (ref 36.0–46.0)
Hemoglobin: 11.7 g/dL — ABNORMAL LOW (ref 12.0–15.0)
MCH: 30 pg (ref 26.0–34.0)
MCHC: 33.6 g/dL (ref 30.0–36.0)
MCV: 89.2 fL (ref 78.0–100.0)
Platelets: 256 10*3/uL (ref 150–400)
RBC: 3.9 MIL/uL (ref 3.87–5.11)
RDW: 15.5 % (ref 11.5–15.5)
WBC: 4.7 10*3/uL (ref 4.0–10.5)

## 2014-10-03 LAB — COMPREHENSIVE METABOLIC PANEL
ALK PHOS: 54 U/L (ref 39–117)
ALT: 10 U/L (ref 0–35)
ANION GAP: 15 (ref 5–15)
AST: 17 U/L (ref 0–37)
Albumin: 3.8 g/dL (ref 3.5–5.2)
BILIRUBIN TOTAL: 0.4 mg/dL (ref 0.3–1.2)
BUN: 23 mg/dL (ref 6–23)
CHLORIDE: 92 meq/L — AB (ref 96–112)
CO2: 26 meq/L (ref 19–32)
Calcium: 9.9 mg/dL (ref 8.4–10.5)
Creatinine, Ser: 1.13 mg/dL — ABNORMAL HIGH (ref 0.50–1.10)
GFR calc Af Amer: 54 mL/min — ABNORMAL LOW (ref >90)
GFR calc non Af Amer: 47 mL/min — ABNORMAL LOW (ref >90)
Glucose, Bld: 252 mg/dL — ABNORMAL HIGH (ref 70–99)
Potassium: 4.4 mEq/L (ref 3.7–5.3)
Sodium: 133 mEq/L — ABNORMAL LOW (ref 137–147)
Total Protein: 7.3 g/dL (ref 6.0–8.3)

## 2014-10-03 MED ORDER — DIPHENOXYLATE-ATROPINE 2.5-0.025 MG PO TABS: ORAL_TABLET | ORAL | Status: DC

## 2014-10-03 NOTE — Progress Notes (Signed)
[facility]  OFFICE PROGRESS Resa Miner, MD [address] [place] [address]  DIAGNOSIS: Uterine endometrial cancer, sarcoma - Plan: Comprehensive metabolic panel, Comprehensive metabolic panel  Peripheral neuropathy due to chemotherapy - Plan: Comprehensive metabolic panel, Comprehensive metabolic panel  Iron deficiency anemia - Plan: Ferritin, CBC  Chief Complaint  Patient presents with  . uterine endometrial cancer, sarcoma    CURRENT THERAPY: Extremity radiotherapy after receiving 2 cycles of chemotherapy with carboplatin and Taxol, the first given on a three-week schedule dosage and the second given on day 1 and 8 only because of the development of severe peripheral neuropathy. She was then referred for radiation therapy which he is currently receiving.  She has had 10 radiation treatments and has developed rectal bleeding as well as diarrhea usually each morning. She denies a fever or chills and has had improvement in peripheral neuropathy since being on gabapentin. She denies a lower extremity swelling or redness, weakness, sore throat, cough, wheezing, abdominal distention, skin rash, headache, or seizures.  INTERVAL HISTORY: Kristina Rodriguez 73 y.o. female returns for followup of carcinosarcoma of the uterus, stage III. She has undergone a previous TAH/BSO with lymphadenectomy.  MEDICAL HISTORY: Past Medical History  Diagnosis Date  . Diabetes mellitus, type II   . Hypertension   . Hyperlipidemia   . Osteoarthritis   . Iron deficiency anemia     attributed to long-term treatment with a PPI  . Anxiety and depression   . Hepatic steatosis 2006    mild  . Cholelithiasis 2006    Acute and chronic cholecystitis; laparoscopic cholecystectomy in 2006  . 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 Chronic pain   . Collagen vascular disease   . Anxiety   . Uterine cancer 7/15    carcinosarcoma    INTERIM HISTORY: has Diabetes mellitus, type II; Hypertension; Hyperlipidemia; Osteoarthritis; Iron deficiency anemia; Other chronic pain; Depressive disorder, not elsewhere classified; Screening for malignant neoplasm of the cervix; Uterine mass; Uterine endometrial cancer, sarcoma; and Hypomagnesemia on her problem list.   Oncology History   73 year old woman with stage IIIA carcinosarcoma of the uterus (with microscopic ovarian involvement on final pathology, negative cervix, negative pelvic and para-aortic nodes).     Uterine endometrial cancer, sarcoma   06/12/2014 Initial Diagnosis Uterine endometrial cancer, sarcoma   06/16/2014 Definitive Surgery robotic hysterectomy, BSO, pelvic and para-aortic lymphadenectomy   07/13/2014 - 08/11/2014 Chemotherapy Paclitaxel and Carboplatin   08/24/2014 Adverse Reaction Significant peripheral neuropathy.  Chemotherapy held and cancelled.  She will move on to radiation therapy, sooner than planned.    ALLERGIES:  is allergic to codeine.  MEDICATIONS: has a current medication list which includes the following prescription(s): accu-chek smartview, alprazolam, assure comfort lancets 30g, atorvastatin, accu-chek aviva, accu-chek nano smartview, esomeprazole, fenofibrate, gabapentin, glipizide, ibuprofen, adjustable lancing device, lidocaine-prilocaine, loperamide, losartan, magnesium oxide, muscle rub, metformin, metoclopramide, oxycodone, pioglitazone, cyclobenzaprine, diphenoxylate-atropine, docusate calcium, polyethylene glycol, prochlorperazine, and sucralfate.  SURGICAL HISTORY:  Past Surgical History  Procedure Laterality Date  . Tonsillectomy    . Carpal tunnel release  1994    Right  . Cholecystectomy, laparoscopic  2006    Dr. Romona Curls; cholelithiasis  . Colonoscopy  2003    Najeeb Rehman;iron deficiency anemia; normal study; hiatal hernia, gastritis, Schatzki's ring on EGD  . Breast biopsy  10/2012    Left; fibroadenoma  .  Cholecystectomy    . Tubal ligation  1970s    removed  . Hysteroscopy w/d&c N/A 06/06/2014    Procedure: DILATATION AND CURETTAGE /HYSTEROSCOPY;  Surgeon: Jonnie Kind, MD;  Location: [facility];  Service: Gynecology;  Laterality: N/A;  . Polypectomy N/A 06/06/2014    Procedure: POLYPECTOMY;  Surgeon: Jonnie Kind, MD;  Location: [facility];  Service: Gynecology;  Laterality: N/A;  . Abdominal hysterectomy  06/16/14    robotic hysterectomy, BSO, pelvic and para-aortic lymphadenectomy for uterine carcinosarcoma    FAMILY HISTORY: family history includes Cancer in her maternal aunt and maternal uncle; Cancer (age of onset: 54) in her mother; Congestive Heart Failure in her father; Diabetes in her brother; Hemochromatosis in her brother.  SOCIAL HISTORY:  reports that she has never smoked. She has never used smokeless tobacco. She reports that she does not drink alcohol or use illicit drugs.  REVIEW OF SYSTEMS:  Other than that discussed above is noncontributory.  PHYSICAL EXAMINATION: ECOG PERFORMANCE STATUS: 1 - Symptomatic but completely ambulatory  Blood pressure 112/52, pulse 95, temperature 98.5 F (36.9 C), temperature source Oral, resp. rate 18, weight 149 lb 11.2 oz (67.903 kg), SpO2 99 %.  GENERAL:alert, no distress and comfortable. Alopecia the scalp.  SKIN: skin color, texture, turgor are normal, no rashes or significant lesions EYES: PERLA; Conjunctiva are pink and non-injected, sclera clear SINUSES: No redness or tenderness over maxillary or ethmoid sinuses OROPHARYNX:no exudate, no erythema on lips, buccal mucosa, or tongue. NECK: supple, thyroid normal size, non-tender, without nodularity. No masses CHEST: LifePort in place. No breast masses. LYMPH:  no palpable lymphadenopathy in the cervical, axillary or inguinal LUNGS: clear to auscultation and percussion with normal breathing effort HEART: regular rate & rhythm and no murmurs. ABDOMEN:abdomen soft, non-tender and normal  bowel sounds MUSCULOSKELETAL:no cyanosis of digits and no clubbing. Range of motion normal.  NEURO: alert & oriented x 3 with fluent speech, no focal motor/sensory deficits. He creased DTRs bilaterally.   LABORATORY DATA: Office Visit on 10/03/2014  Component Date Value Ref Range Status  . WBC 10/03/2014 4.7  4.0 - 10.5 K/uL Final  . RBC 10/03/2014 3.90  3.87 - 5.11 MIL/uL Final  . Hemoglobin 10/03/2014 11.7* 12.0 - 15.0 g/dL Final  . HCT 10/03/2014 34.8* 36.0 - 46.0 % Final  . MCV 10/03/2014 89.2  78.0 - 100.0 fL Final  . MCH 10/03/2014 30.0  26.0 - 34.0 pg Final  . MCHC 10/03/2014 33.6  30.0 - 36.0 g/dL Final  . RDW 10/03/2014 15.5  11.5 - 15.5 % Final  . Platelets 10/03/2014 256  150 - 400 K/uL Final  . Sodium 10/03/2014 133* 137 - 147 mEq/L Final  . Potassium 10/03/2014 4.4  3.7 - 5.3 mEq/L Final  . Chloride 10/03/2014 92* 96 - 112 mEq/L Final  . CO2 10/03/2014 26  19 - 32 mEq/L Final  . Glucose, Bld 10/03/2014 252* 70 - 99 mg/dL Final  . BUN 10/03/2014 23  6 - 23 mg/dL Final  . Creatinine, Ser 10/03/2014 1.13* 0.50 - 1.10 mg/dL Final  . Calcium 10/03/2014 9.9  8.4 - 10.5 mg/dL Final  . Total Protein 10/03/2014 7.3  6.0 - 8.3 g/dL Final  . Albumin 10/03/2014 3.8  3.5 - 5.2 g/dL Final  . AST 10/03/2014 17  0 - 37 U/L Final  . ALT 10/03/2014 10  0 - 35 U/L Final  . Alkaline Phosphatase 10/03/2014 54  39 - 117 U/L Final  . Total Bilirubin 10/03/2014 0.4  0.3 - 1.2 mg/dL Final  . GFR calc non Af Amer 10/03/2014 47* >90 mL/min Final  . GFR calc Af Amer 10/03/2014 54* >90 mL/min Final   Comment: (NOTE) The eGFR has been calculated using the CKD EPI equation. This calculation has not been validated in all clinical situations. eGFR's persistently <90 mL/min signify possible Chronic Kidney Disease.   . Anion gap 10/03/2014 15  5 - 15 Final  Lab on 09/21/2014  Component Date Value Ref Range Status  . WBC 09/21/2014 6.3  4.0 - 10.5 K/uL Final  . RBC 09/21/2014 3.95  3.87 - 5.11  MIL/uL Final  . Hemoglobin 09/21/2014 11.9* 12.0 - 15.0 g/dL Final  . HCT 09/21/2014 35.6* 36.0 - 46.0 % Final  . MCV 09/21/2014 90.1  78.0 - 100.0 fL Final  . MCH 09/21/2014 30.1  26.0 - 34.0 pg Final  . MCHC 09/21/2014 33.4  30.0 - 36.0 g/dL Final  . RDW 09/21/2014 15.7* 11.5 - 15.5 % Final  . Platelets 09/21/2014 354  150 - 400 K/uL Final    PATHOLOGY: Uterine carcinosarcoma  Urinalysis    Component Value Date/Time   COLORURINE YELLOW 06/01/2014 1130   APPEARANCEUR CLEAR 06/01/2014 1130   LABSPEC <1.005* 06/01/2014 1130   PHURINE 7.0 06/01/2014 1130   GLUCOSEU 100* 06/01/2014 1130   HGBUR NEGATIVE 06/01/2014 1130   BILIRUBINUR NEGATIVE 06/01/2014 1130   KETONESUR NEGATIVE 06/01/2014 1130   PROTEINUR NEGATIVE 06/01/2014 1130   UROBILINOGEN 0.2 06/01/2014 1130   NITRITE NEGATIVE 06/01/2014 1130   LEUKOCYTESUR NEGATIVE 06/01/2014 1130    RADIOGRAPHIC STUDIES: No results found.  ASSESSMENT:  1. Stage III carcinosarcoma of the uterus, status post TAH, BSO, pelvic and para-aortic lymphadenectomy. Status post 2 cycles of carboplatin/Taxol resulting in severe peripheral neuropathy 2. Iron deficiency anemia responsive to IV Feraheme  3. 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 Anemia secondary to chemotherapy  5. Hypomagnesemia secondary to chemotherapy 6. Peripheral neuropathy, grade 3, improved 7. Anemia secondary to chemotherapy.    PLAN:  #1. Complete radiation therapy. Suggested she undergo treatment with a full bladder. #2. Lomotil up to 2 every 2 hours to control diarrhea. #3. Discontinue magnesium supplements. #4. Follow-up on 10/23/2014 with CBC, chem profile.   All questions were answered. The patient knows to call the clinic with any problems, questions or concerns. We can certainly see the patient much sooner if necessary.   I spent 25 minutes counseling the patient face to face. The total time spent in the appointment was 30  minutes.    Doroteo Bradford, MD 10/04/2014 6:28 AM  DISCLAIMER:  This note was dictated with voice recognition software.  Similar sounding words can inadvertently be transcribed inaccurately and may not be corrected upon review.

## 2014-10-03 NOTE — Progress Notes (Signed)
Kristina Rodriguez presented for labwork. Labs per MD order drawn via Peripheral Line 23 gauge needle inserted in left AC  Good blood return present. Procedure without incident.  Needle removed intact. Patient tolerated procedure well.

## 2014-10-03 NOTE — Patient Instructions (Signed)
[place] Discharge Instructions  RECOMMENDATIONS MADE BY THE CONSULTANT AND ANY TEST RESULTS WILL BE SENT TO YOUR REFERRING PHYSICIAN.  EXAM FINDINGS BY THE PHYSICIAN TODAY AND SIGNS OR SYMPTOMS TO REPORT TO CLINIC OR PRIMARY PHYSICIAN: Exam and findings as discussed by Dr. Barnet Glasgow.  Want you to stop the magnesium for now.  Try to have a full bladder before radiation to help preserve your bladder.  Will check labs today and if anything of concern, we will contact you.  MEDICATIONS PRESCRIBED:  Lomotil - take as directed.  INSTRUCTIONS/FOLLOW-UP: Port flush as scheduled Labs and office visit on 10/23/14.  Thank you for choosing [place] [facility] to provide your oncology and hematology care.  To afford each patient quality time with our providers, please arrive at least 15 minutes before your scheduled appointment time.  With your help, our goal is to use those 15 minutes to complete the necessary work-up to ensure our physicians have the information they need to help with your evaluation and healthcare recommendations.    Effective January 1st, 2014, we ask that you re-schedule your appointment with our physicians should you arrive 10 or more minutes late for your appointment.  We strive to give you quality time with our providers, and arriving late affects you and other patients whose appointments are after yours.    Again, thank you for choosing [facility].  Our hope is that these requests will decrease the amount of time that you wait before being seen by our physicians.       _____________________________________________________________  Should you have questions after your visit to [facility], please contact our office at [phone] between the hours of 8:30 a.m. and 4:30 p.m.  Voicemails left after 4:30 p.m. will not be returned until the following business day.  For prescription refill requests, have your pharmacy contact our  office with your prescription refill request.    _______________________________________________________________  We hope that we have given you very good care.  You may receive a patient satisfaction survey in the mail, please complete it and return it as soon as possible.  We value your feedback!  _______________________________________________________________  Have you asked about our STAR program?  STAR stands for Survivorship Training and Rehabilitation, and this is a nationally recognized cancer care program that focuses on survivorship and rehabilitation.  Cancer and cancer treatments may cause problems, such as, pain, making you feel tired and keeping you from doing the things that you need or want to do. Cancer rehabilitation can help. Our goal is to reduce these troubling effects and help you have the best quality of life possible.  You may receive a survey from a nurse that asks questions about your current state of health.  Based on the survey results, all eligible patients will be referred to the [facility] program for an evaluation so we can better serve you!  A frequently asked questions sheet is available upon request.

## 2014-10-12 ENCOUNTER — Encounter (HOSPITAL_COMMUNITY): Payer: Commercial Managed Care - HMO

## 2014-10-12 ENCOUNTER — Ambulatory Visit (HOSPITAL_COMMUNITY): Payer: Medicare HMO

## 2014-10-12 ENCOUNTER — Other Ambulatory Visit (HOSPITAL_COMMUNITY): Payer: Medicare HMO

## 2014-10-12 ENCOUNTER — Encounter (HOSPITAL_COMMUNITY): Payer: Commercial Managed Care - HMO | Attending: Hematology and Oncology

## 2014-10-12 VITALS — BP 120/53 | HR 92 | Temp 98.0°F | Resp 18

## 2014-10-12 DIAGNOSIS — C55 Malignant neoplasm of uterus, part unspecified: Secondary | ICD-10-CM | POA: Insufficient documentation

## 2014-10-12 DIAGNOSIS — Z452 Encounter for adjustment and management of vascular access device: Secondary | ICD-10-CM

## 2014-10-12 DIAGNOSIS — D509 Iron deficiency anemia, unspecified: Secondary | ICD-10-CM | POA: Diagnosis present

## 2014-10-12 DIAGNOSIS — C549 Malignant neoplasm of corpus uteri, unspecified: Secondary | ICD-10-CM | POA: Insufficient documentation

## 2014-10-12 DIAGNOSIS — Z95828 Presence of other vascular implants and grafts: Secondary | ICD-10-CM

## 2014-10-12 LAB — CBC
HCT: 32.1 % — ABNORMAL LOW (ref 36.0–46.0)
Hemoglobin: 10.8 g/dL — ABNORMAL LOW (ref 12.0–15.0)
MCH: 30.4 pg (ref 26.0–34.0)
MCHC: 33.6 g/dL (ref 30.0–36.0)
MCV: 90.4 fL (ref 78.0–100.0)
Platelets: 180 10*3/uL (ref 150–400)
RBC: 3.55 MIL/uL — ABNORMAL LOW (ref 3.87–5.11)
RDW: 15.7 % — ABNORMAL HIGH (ref 11.5–15.5)
WBC: 3.8 10*3/uL — ABNORMAL LOW (ref 4.0–10.5)

## 2014-10-12 MED ORDER — HEPARIN SOD (PORK) LOCK FLUSH 100 UNIT/ML IV SOLN
INTRAVENOUS | Status: AC
  Filled 2014-10-12: qty 5

## 2014-10-12 MED ORDER — SODIUM CHLORIDE 0.9 % IJ SOLN
10.0000 mL | Freq: Once | INTRAMUSCULAR | Status: AC
  Administered 2014-10-12: 10 mL via INTRAVENOUS

## 2014-10-12 MED ORDER — HEPARIN SOD (PORK) LOCK FLUSH 100 UNIT/ML IV SOLN
500.0000 [IU] | Freq: Once | INTRAVENOUS | Status: AC
  Administered 2014-10-12: 500 [IU] via INTRAVENOUS

## 2014-10-12 NOTE — Progress Notes (Signed)
Kristina Rodriguez presented for Portacath access and flush. Proper placement of portacath confirmed by CXR. Portacath located right chest wall accessed with  H 20 needle. Good blood return present. Portacath flushed with 76m NS and 500U/581mHeparin and needle removed intact. Procedure without incident. Patient tolerated procedure well.  Hemoglobin 10.8 today. Aranesp not given, treatment parameters not met.

## 2014-10-12 NOTE — Patient Instructions (Signed)
[place] Discharge Instructions  RECOMMENDATIONS MADE BY THE CONSULTANT AND ANY TEST RESULTS WILL BE SENT TO YOUR REFERRING PHYSICIAN.  MEDICATIONS PRESCRIBED:  Aranesp not needed today, hempglobin 10.8.  INSTRUCTIONS/FOLLOW-UP: Return to clinic as scheduled.  Thank you for choosing [place] to provide your oncology and hematology care.  To afford each patient quality time with our providers, please arrive at least 15 minutes before your scheduled appointment time.  With your help, our goal is to use those 15 minutes to complete the necessary work-up to ensure our physicians have the information they need to help with your evaluation and healthcare recommendations.    Effective January 1st, 2014, we ask that you re-schedule your appointment with our physicians should you arrive 10 or more minutes late for your appointment.  We strive to give you quality time with our providers, and arriving late affects you and other patients whose appointments are after yours.    Again, thank you for choosing [facility].  Our hope is that these requests will decrease the amount of time that you wait before being seen by our physicians.       _____________________________________________________________  Should you have questions after your visit to [facility], please contact our office at [phone] between the hours of 8:30 a.m. and 4:30 p.m.  Voicemails left after 4:30 p.m. will not be returned until the following business day.  For prescription refill requests, have your pharmacy contact our office with your prescription refill request.    _______________________________________________________________  We hope that we have given you very good care.  You may receive a patient satisfaction survey in the mail, please complete it and return it as soon as possible.  We value your  feedback!  _______________________________________________________________  Have you asked about our STAR program?  STAR stands for Survivorship Training and Rehabilitation, and this is a nationally recognized cancer care program that focuses on survivorship and rehabilitation.  Cancer and cancer treatments may cause problems, such as, pain, making you feel tired and keeping you from doing the things that you need or want to do. Cancer rehabilitation can help. Our goal is to reduce these troubling effects and help you have the best quality of life possible.  You may receive a survey from a nurse that asks questions about your current state of health.  Based on the survey results, all eligible patients will be referred to the [facility] program for an evaluation so we can better serve you!  A frequently asked questions sheet is available upon request.

## 2014-10-19 ENCOUNTER — Other Ambulatory Visit (HOSPITAL_COMMUNITY): Payer: Self-pay

## 2014-10-19 DIAGNOSIS — C549 Malignant neoplasm of corpus uteri, unspecified: Secondary | ICD-10-CM

## 2014-10-23 ENCOUNTER — Ambulatory Visit (HOSPITAL_COMMUNITY): Payer: Medicare HMO

## 2014-10-23 ENCOUNTER — Other Ambulatory Visit (HOSPITAL_COMMUNITY): Payer: Medicare HMO

## 2014-10-24 ENCOUNTER — Encounter (HOSPITAL_BASED_OUTPATIENT_CLINIC_OR_DEPARTMENT_OTHER): Payer: Commercial Managed Care - HMO

## 2014-10-24 DIAGNOSIS — C549 Malignant neoplasm of corpus uteri, unspecified: Secondary | ICD-10-CM

## 2014-10-24 DIAGNOSIS — D509 Iron deficiency anemia, unspecified: Secondary | ICD-10-CM | POA: Diagnosis not present

## 2014-10-24 LAB — CBC WITH DIFFERENTIAL/PLATELET
BASOS PCT: 0 % (ref 0–1)
Basophils Absolute: 0 10*3/uL (ref 0.0–0.1)
EOS ABS: 0.2 10*3/uL (ref 0.0–0.7)
Eosinophils Relative: 2 % (ref 0–5)
HCT: 29.5 % — ABNORMAL LOW (ref 36.0–46.0)
Hemoglobin: 9.7 g/dL — ABNORMAL LOW (ref 12.0–15.0)
LYMPHS ABS: 0.4 10*3/uL — AB (ref 0.7–4.0)
Lymphocytes Relative: 6 % — ABNORMAL LOW (ref 12–46)
MCH: 30.2 pg (ref 26.0–34.0)
MCHC: 32.9 g/dL (ref 30.0–36.0)
MCV: 91.9 fL (ref 78.0–100.0)
Monocytes Absolute: 0.6 10*3/uL (ref 0.1–1.0)
Monocytes Relative: 9 % (ref 3–12)
Neutro Abs: 5.6 10*3/uL (ref 1.7–7.7)
Neutrophils Relative %: 83 % — ABNORMAL HIGH (ref 43–77)
Platelets: 208 10*3/uL (ref 150–400)
RBC: 3.21 MIL/uL — ABNORMAL LOW (ref 3.87–5.11)
RDW: 15.7 % — ABNORMAL HIGH (ref 11.5–15.5)
WBC: 6.8 10*3/uL (ref 4.0–10.5)

## 2014-10-24 LAB — COMPREHENSIVE METABOLIC PANEL
ALBUMIN: 3.4 g/dL — AB (ref 3.5–5.2)
ALK PHOS: 59 U/L (ref 39–117)
ALT: 10 U/L (ref 0–35)
AST: 14 U/L (ref 0–37)
Anion gap: 14 (ref 5–15)
BILIRUBIN TOTAL: 0.4 mg/dL (ref 0.3–1.2)
BUN: 19 mg/dL (ref 6–23)
CHLORIDE: 94 meq/L — AB (ref 96–112)
CO2: 25 mEq/L (ref 19–32)
Calcium: 9.3 mg/dL (ref 8.4–10.5)
Creatinine, Ser: 0.88 mg/dL (ref 0.50–1.10)
GFR calc non Af Amer: 64 mL/min — ABNORMAL LOW (ref >90)
GFR, EST AFRICAN AMERICAN: 74 mL/min — AB (ref >90)
Glucose, Bld: 219 mg/dL — ABNORMAL HIGH (ref 70–99)
POTASSIUM: 4.1 meq/L (ref 3.7–5.3)
SODIUM: 133 meq/L — AB (ref 137–147)
Total Protein: 6.9 g/dL (ref 6.0–8.3)

## 2014-10-24 NOTE — Progress Notes (Signed)
Labs for cbcd,ferr,cmp

## 2014-10-25 ENCOUNTER — Other Ambulatory Visit (HOSPITAL_COMMUNITY): Payer: Medicare HMO

## 2014-10-25 ENCOUNTER — Encounter (HOSPITAL_BASED_OUTPATIENT_CLINIC_OR_DEPARTMENT_OTHER): Payer: Commercial Managed Care - HMO

## 2014-10-25 ENCOUNTER — Encounter (HOSPITAL_COMMUNITY): Payer: Self-pay

## 2014-10-25 VITALS — BP 118/67 | HR 104 | Temp 98.6°F | Resp 18 | Wt 150.6 lb

## 2014-10-25 DIAGNOSIS — R609 Edema, unspecified: Secondary | ICD-10-CM

## 2014-10-25 DIAGNOSIS — D6481 Anemia due to antineoplastic chemotherapy: Secondary | ICD-10-CM | POA: Diagnosis not present

## 2014-10-25 DIAGNOSIS — G622 Polyneuropathy due to other toxic agents: Secondary | ICD-10-CM

## 2014-10-25 DIAGNOSIS — C55 Malignant neoplasm of uterus, part unspecified: Secondary | ICD-10-CM

## 2014-10-25 LAB — FERRITIN: FERRITIN: 659 ng/mL — AB (ref 10–291)

## 2014-10-25 MED ORDER — FUROSEMIDE 40 MG PO TABS: ORAL_TABLET | ORAL | Status: DC

## 2014-10-25 MED ORDER — DIPHENOXYLATE-ATROPINE 2.5-0.025 MG PO TABS: ORAL_TABLET | ORAL | Status: DC

## 2014-10-25 MED ORDER — POTASSIUM CHLORIDE CRYS ER 20 MEQ PO TBCR: 20.0000 meq | EXTENDED_RELEASE_TABLET | Freq: Two times a day (BID) | ORAL | Status: DC

## 2014-10-25 NOTE — Patient Instructions (Signed)
..  [place] Discharge Instructions  RECOMMENDATIONS MADE BY THE CONSULTANT AND ANY TEST RESULTS WILL BE SENT TO YOUR REFERRING PHYSICIAN.  EXAM FINDINGS BY THE PHYSICIAN TODAY AND SIGNS OR SYMPTOMS TO REPORT TO CLINIC OR PRIMARY PHYSICIAN: Exam and findings as discussed by Dr. Barnet Glasgow.  MEDICATIONS PRESCRIBED:  Take 2 lomotil at bedtime. You can still take them in the morning if needed. Gabapentin take 2 capsules 4 times a day Take lasix for leg swelling every morning as needed and take Potassium as ordered when you take the lasix  INSTRUCTIONS/FOLLOW-UP: Dr. Alycia Rossetti end of January  See [place] back the first week in February  Thank you for choosing [facility] to provide your oncology and hematology care.  To afford each patient quality time with our providers, please arrive at least 15 minutes before your scheduled appointment time.  With your help, our goal is to use those 15 minutes to complete the necessary work-up to ensure our physicians have the information they need to help with your evaluation and healthcare recommendations.    Effective January 1st, 2014, we ask that you re-schedule your appointment with our physicians should you arrive 10 or more minutes late for your appointment.  We strive to give you quality time with our providers, and arriving late affects you and other patients whose appointments are after yours.    Again, thank you for choosing [facility].  Our hope is that these requests will decrease the amount of time that you wait before being seen by our physicians.       _____________________________________________________________  Should you have questions after your visit to [facility], please contact our office at [phone] between the hours of 8:30 a.m. and 4:30 p.m.  Voicemails left after 4:30 p.m. will not be returned until the following business day.  For prescription refill requests, have your  pharmacy contact our office with your prescription refill request.    _______________________________________________________________  We hope that we have given you very good care.  You may receive a patient satisfaction survey in the mail, please complete it and return it as soon as possible.  We value your feedback!  _______________________________________________________________  Have you asked about our STAR program?  STAR stands for Survivorship Training and Rehabilitation, and this is a nationally recognized cancer care program that focuses on survivorship and rehabilitation.  Cancer and cancer treatments may cause problems, such as, pain, making you feel tired and keeping you from doing the things that you need or want to do. Cancer rehabilitation can help. Our goal is to reduce these troubling effects and help you have the best quality of life possible.  You may receive a survey from a nurse that asks questions about your current state of health.  Based on the survey results, all eligible patients will be referred to the [facility] program for an evaluation so we can better serve you!  A frequently asked questions sheet is available upon request.

## 2014-10-25 NOTE — Progress Notes (Signed)
Kristina Rodriguez  OFFICE PROGRESS NOTE  Kristina Noble, MD [address] [place] [address]  DIAGNOSIS: Carcinosarcoma of uterus - Plan: Consult to obstetrics / gynecology  Chief Complaint  Patient presents with  . Follow-up    CURRENT THERAPY: External beam radiotherapy  INTERVAL HISTORY: Kristina Rodriguez 73 y.o. female returns after receiving 2 cycles of chemotherapy for uterine carcinosarcoma with carboplatin and Taxol, the first given on a three-week schedule dosage and the second given on day 1 and 8 only,  because of the development of severe peripheral neuropathy. Chemotherapy was discontinued and the patient  was then referred for radiation therapy. She's had no further rectal bleeding but does have diarrhea lasting about 90 minutes every morning when she awakens. Her radiation treatments are given at noon each day. She takes Lomotil with some relief. She denies any fever, night sweats, but has had lower extremity swelling which does not dissipate overnight. She denies any cough, wheezing, sore throat, PND, orthopnea, palpitations, with worsening peripheral paresthesias. She is able to ambulate with help. Current dosage of gabapentin is 600 mg 3 times a day.     MEDICAL HISTORY: Past Medical History  Diagnosis Date  . Diabetes mellitus, type II   . Hypertension   . Hyperlipidemia   . Osteoarthritis   . Iron deficiency anemia     attributed to long-term treatment with a PPI  . Anxiety and depression   . Hepatic steatosis 2006    mild  . Cholelithiasis 2006    Acute and chronic cholecystitis; laparoscopic cholecystectomy in 2006  . 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 Chronic pain   . Collagen vascular disease   . Anxiety   . Uterine cancer 7/15    carcinosarcoma    INTERIM HISTORY: has Diabetes mellitus, type II; Hypertension; Hyperlipidemia; Osteoarthritis; Iron deficiency  anemia; Other chronic pain; Depressive disorder, not elsewhere classified; Screening for malignant neoplasm of the cervix; Uterine mass; Uterine endometrial cancer, sarcoma; and Hypomagnesemia on her problem list.    ALLERGIES:  is allergic to codeine.  MEDICATIONS: has a current medication list which includes the following prescription(s): accu-chek smartview, alprazolam, assure comfort lancets 30g, atorvastatin, accu-chek aviva, accu-chek nano smartview, cyclobenzaprine, diphenoxylate-atropine, docusate calcium, esomeprazole, fenofibrate, gabapentin, glipizide, adjustable lancing device, lidocaine-prilocaine, losartan, muscle rub, metformin, oxycodone, phenazopyridine, pioglitazone, furosemide, ibuprofen, loperamide, metoclopramide, polyethylene glycol, potassium chloride sa, prochlorperazine, and sucralfate.  SURGICAL HISTORY:  Past Surgical History  Procedure Laterality Date  . Tonsillectomy    . Carpal tunnel release  1994    Right  . Cholecystectomy, laparoscopic  2006    Dr. Romona Curls; cholelithiasis  . Colonoscopy  2003    Najeeb Rehman;iron deficiency anemia; normal study; hiatal hernia, gastritis, Schatzki's ring on EGD  . Breast biopsy  10/2012    Left; fibroadenoma  . Cholecystectomy    . Tubal ligation  1970s    removed  . Hysteroscopy w/d&c N/A 06/06/2014    Procedure: DILATATION AND CURETTAGE /HYSTEROSCOPY;  Surgeon: Jonnie Kind, MD;  Location: [facility];  Service: Gynecology;  Laterality: N/A;  . Polypectomy N/A 06/06/2014    Procedure: POLYPECTOMY;  Surgeon: Jonnie Kind, MD;  Location: [facility];  Service: Gynecology;  Laterality: N/A;  . Abdominal hysterectomy  06/16/14    robotic hysterectomy, BSO, pelvic and para-aortic lymphadenectomy for uterine carcinosarcoma    FAMILY HISTORY: family history includes Cancer  in her maternal aunt and maternal uncle; Cancer (age of onset: 56) in her mother; Congestive Heart Failure in her father; Diabetes in her brother; Hemochromatosis  in her brother.  SOCIAL HISTORY:  reports that she has never smoked. She has never used smokeless tobacco. She reports that she does not drink alcohol or use illicit drugs.  REVIEW OF SYSTEMS:  Other than that discussed above is noncontributory.  PHYSICAL EXAMINATION: ECOG PERFORMANCE STATUS: 1 - Symptomatic but completely ambulatory  Blood pressure 118/67, pulse 104, temperature 98.6 F (37 C), temperature source Oral, resp. rate 18, weight 150 lb 9.6 oz (68.312 kg), SpO2 99 %.  GENERAL:alert, no distress and comfortable SKIN: skin color, texture, turgor are normal, no rashes or significant lesions EYES: PERLA; Conjunctiva are pink and non-injected, sclera clear SINUSES: No redness or tenderness over maxillary or ethmoid sinuses OROPHARYNX:no exudate, no erythema on lips, buccal mucosa, or tongue. NECK: supple, thyroid normal size, non-tender, without nodularity. No masses CHEST: Normal AP diameter with light port in place. LYMPH:  no palpable lymphadenopathy in the cervical, axillary or inguinal LUNGS: clear to auscultation and percussion with normal breathing effort HEART: regular rate & rhythm and no murmurs. ABDOMEN:abdomen soft, non-tender and normal bowel sounds. Well-healed surgical scar with no evidence of hepatomegaly, ascites, or CVA tenderness. MUSCULOSKELETAL:no cyanosis of digits and no clubbing. Range of motion normal. +2 pitting edema both lower 70s. NEURO: alert & oriented x 3 with fluent speech, no focal motor/sensory deficits   LABORATORY DATA: Lab on 10/24/2014  Component Date Value Ref Range Status  . Ferritin 10/24/2014 659* 10 - 291 ng/mL Final   Performed at [facility]  . Sodium 10/24/2014 133* 137 - 147 mEq/L Final  . Potassium 10/24/2014 4.1  3.7 - 5.3 mEq/L Final  . Chloride 10/24/2014 94* 96 - 112 mEq/L Final  . CO2 10/24/2014 25  19 - 32 mEq/L Final  . Glucose, Bld 10/24/2014 219* 70 - 99 mg/dL Final  . BUN 10/24/2014 19  6 - 23 mg/dL Final    . Creatinine, Ser 10/24/2014 0.88  0.50 - 1.10 mg/dL Final  . Calcium 10/24/2014 9.3  8.4 - 10.5 mg/dL Final  . Total Protein 10/24/2014 6.9  6.0 - 8.3 g/dL Final  . Albumin 10/24/2014 3.4* 3.5 - 5.2 g/dL Final  . AST 10/24/2014 14  0 - 37 U/L Final  . ALT 10/24/2014 10  0 - 35 U/L Final  . Alkaline Phosphatase 10/24/2014 59  39 - 117 U/L Final  . Total Bilirubin 10/24/2014 0.4  0.3 - 1.2 mg/dL Final  . GFR calc non Af Amer 10/24/2014 64* >90 mL/min Final  . GFR calc Af Amer 10/24/2014 74* >90 mL/min Final   Comment: (NOTE) The eGFR has been calculated using the CKD EPI equation. This calculation has not been validated in all clinical situations. eGFR's persistently <90 mL/min signify possible Chronic Kidney Disease.   . Anion gap 10/24/2014 14  5 - 15 Final  . WBC 10/24/2014 6.8  4.0 - 10.5 K/uL Final  . RBC 10/24/2014 3.21* 3.87 - 5.11 MIL/uL Final  . Hemoglobin 10/24/2014 9.7* 12.0 - 15.0 g/dL Final  . HCT 10/24/2014 29.5* 36.0 - 46.0 % Final  . MCV 10/24/2014 91.9  78.0 - 100.0 fL Final  . MCH 10/24/2014 30.2  26.0 - 34.0 pg Final  . MCHC 10/24/2014 32.9  30.0 - 36.0 g/dL Final  . RDW 10/24/2014 15.7* 11.5 - 15.5 % Final  . Platelets 10/24/2014 208  150 -  400 K/uL Final  . Neutrophils Relative % 10/24/2014 83* 43 - 77 % Final  . Neutro Abs 10/24/2014 5.6  1.7 - 7.7 K/uL Final  . Lymphocytes Relative 10/24/2014 6* 12 - 46 % Final  . Lymphs Abs 10/24/2014 0.4* 0.7 - 4.0 K/uL Final  . Monocytes Relative 10/24/2014 9  3 - 12 % Final  . Monocytes Absolute 10/24/2014 0.6  0.1 - 1.0 K/uL Final  . Eosinophils Relative 10/24/2014 2  0 - 5 % Final  . Eosinophils Absolute 10/24/2014 0.2  0.0 - 0.7 K/uL Final  . Basophils Relative 10/24/2014 0  0 - 1 % Final  . Basophils Absolute 10/24/2014 0.0  0.0 - 0.1 K/uL Final  Infusion on 10/12/2014  Component Date Value Ref Range Status  . WBC 10/12/2014 3.8* 4.0 - 10.5 K/uL Final  . RBC 10/12/2014 3.55* 3.87 - 5.11 MIL/uL Final  .  Hemoglobin 10/12/2014 10.8* 12.0 - 15.0 g/dL Final  . HCT 10/12/2014 32.1* 36.0 - 46.0 % Final  . MCV 10/12/2014 90.4  78.0 - 100.0 fL Final  . MCH 10/12/2014 30.4  26.0 - 34.0 pg Final  . MCHC 10/12/2014 33.6  30.0 - 36.0 g/dL Final  . RDW 10/12/2014 15.7* 11.5 - 15.5 % Final  . Platelets 10/12/2014 180  150 - 400 K/uL Final  Office Visit on 10/03/2014  Component Date Value Ref Range Status  . WBC 10/03/2014 4.7  4.0 - 10.5 K/uL Final  . RBC 10/03/2014 3.90  3.87 - 5.11 MIL/uL Final  . Hemoglobin 10/03/2014 11.7* 12.0 - 15.0 g/dL Final  . HCT 10/03/2014 34.8* 36.0 - 46.0 % Final  . MCV 10/03/2014 89.2  78.0 - 100.0 fL Final  . MCH 10/03/2014 30.0  26.0 - 34.0 pg Final  . MCHC 10/03/2014 33.6  30.0 - 36.0 g/dL Final  . RDW 10/03/2014 15.5  11.5 - 15.5 % Final  . Platelets 10/03/2014 256  150 - 400 K/uL Final  . Sodium 10/03/2014 133* 137 - 147 mEq/L Final  . Potassium 10/03/2014 4.4  3.7 - 5.3 mEq/L Final  . Chloride 10/03/2014 92* 96 - 112 mEq/L Final  . CO2 10/03/2014 26  19 - 32 mEq/L Final  . Glucose, Bld 10/03/2014 252* 70 - 99 mg/dL Final  . BUN 10/03/2014 23  6 - 23 mg/dL Final  . Creatinine, Ser 10/03/2014 1.13* 0.50 - 1.10 mg/dL Final  . Calcium 10/03/2014 9.9  8.4 - 10.5 mg/dL Final  . Total Protein 10/03/2014 7.3  6.0 - 8.3 g/dL Final  . Albumin 10/03/2014 3.8  3.5 - 5.2 g/dL Final  . AST 10/03/2014 17  0 - 37 U/L Final  . ALT 10/03/2014 10  0 - 35 U/L Final  . Alkaline Phosphatase 10/03/2014 54  39 - 117 U/L Final  . Total Bilirubin 10/03/2014 0.4  0.3 - 1.2 mg/dL Final  . GFR calc non Af Amer 10/03/2014 47* >90 mL/min Final  . GFR calc Af Amer 10/03/2014 54* >90 mL/min Final   Comment: (NOTE) The eGFR has been calculated using the CKD EPI equation. This calculation has not been validated in all clinical situations. eGFR's persistently <90 mL/min signify possible Chronic Kidney Disease.   . Anion gap 10/03/2014 15  5 - 15 Final    PATHOLOGY: No new  pathology.  Urinalysis    Component Value Date/Time   COLORURINE YELLOW 06/01/2014 1130   APPEARANCEUR CLEAR 06/01/2014 1130   LABSPEC <1.005* 06/01/2014 1130   PHURINE 7.0 06/01/2014 1130  GLUCOSEU 100* 06/01/2014 1130   HGBUR NEGATIVE 06/01/2014 1130   BILIRUBINUR NEGATIVE 06/01/2014 1130   KETONESUR NEGATIVE 06/01/2014 1130   PROTEINUR NEGATIVE 06/01/2014 1130   UROBILINOGEN 0.2 06/01/2014 1130   NITRITE NEGATIVE 06/01/2014 1130   LEUKOCYTESUR NEGATIVE 06/01/2014 1130    RADIOGRAPHIC STUDIES: No results found.  ASSESSMENT: 1. Stage III carcinosarcoma of the uterus, status post TAH, BSO, pelvic and para-aortic lymphadenectomy. Status post 2 cycles of carboplatin/Taxol resulting in severe peripheral neuropathy, currently receiving radiotherapy to be completed on 10/31/2014. 2. Iron deficiency anemia responsive to IV Feraheme  3. 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  4. Anemia secondary to chemotherapy  5. Hypomagnesemia secondary to chemotherapy 6. Anemia secondary to chemotherapy and radiation. 7. Diarrhea secondary to radiotherapy. 8. Lower extremity edema.  PLAN:  #1. Furosemide 40 mg each morning plus K-Dur 20 twice a day to control lower extremity edema. #2. Complete radiotherapy. #3. Two Lomotil at bedtime in addition to the 2 doses she takes during the morning. #4. Increase gabapentin to 600 mg 4 times a day. #5. Follow-up with Dr. Alycia Rossetti in late January 2016. #6. Often this clinic in early February 2016. CBC and chem profile with CA-125 at that time   All questions were answered. The patient knows to call the clinic with any problems, questions or concerns. We can certainly see the patient much sooner if necessary.   I spent 30 minutes counseling the patient face to face. The total time spent in the appointment was 40 minutes.    Doroteo Bradford, MD 10/25/2014 2:55 PM  DISCLAIMER:  This note was dictated with voice  recognition software.  Similar sounding words can inadvertently be transcribed inaccurately and may not be corrected upon review.

## 2014-11-23 ENCOUNTER — Encounter (HOSPITAL_COMMUNITY): Payer: Self-pay

## 2014-11-23 ENCOUNTER — Encounter (HOSPITAL_COMMUNITY): Payer: Commercial Managed Care - HMO | Attending: Hematology and Oncology

## 2014-11-23 VITALS — BP 117/54 | HR 94 | Temp 98.5°F | Resp 18

## 2014-11-23 DIAGNOSIS — Z95828 Presence of other vascular implants and grafts: Secondary | ICD-10-CM

## 2014-11-23 DIAGNOSIS — C549 Malignant neoplasm of corpus uteri, unspecified: Secondary | ICD-10-CM | POA: Insufficient documentation

## 2014-11-23 DIAGNOSIS — D509 Iron deficiency anemia, unspecified: Secondary | ICD-10-CM | POA: Insufficient documentation

## 2014-11-23 DIAGNOSIS — C55 Malignant neoplasm of uterus, part unspecified: Secondary | ICD-10-CM

## 2014-11-23 DIAGNOSIS — Z452 Encounter for adjustment and management of vascular access device: Secondary | ICD-10-CM

## 2014-11-23 MED ORDER — HEPARIN SOD (PORK) LOCK FLUSH 100 UNIT/ML IV SOLN
500.0000 [IU] | Freq: Once | INTRAVENOUS | Status: AC
  Administered 2014-11-23: 500 [IU] via INTRAVENOUS

## 2014-11-23 MED ORDER — SODIUM CHLORIDE 0.9 % IJ SOLN
10.0000 mL | INTRAMUSCULAR | Status: DC | PRN
  Administered 2014-11-23: 10 mL via INTRAVENOUS
  Filled 2014-11-23: qty 10

## 2014-11-23 MED ORDER — HEPARIN SOD (PORK) LOCK FLUSH 100 UNIT/ML IV SOLN
INTRAVENOUS | Status: AC
  Filled 2014-11-23: qty 5

## 2014-11-23 NOTE — Patient Instructions (Signed)
[place] at [facility]  Discharge Instructions: You had your port flushed.  Please return as scheduled.  Call the clinic if you have any questions or concerns  _______________________________________________________________  Thank you for choosing [place] at [facility] to provide your oncology and hematology care.  To afford each patient quality time with our providers, please arrive at least 15 minutes before your scheduled appointment.  You need to re-schedule your appointment if you arrive 10 or more minutes late.  We strive to give you quality time with our providers, and arriving late affects you and other patients whose appointments are after yours.  Also, if you no show three or more times for appointments you may be dismissed from the clinic.  Again, thank you for choosing [place] at [place] hope is that these requests will allow you access to exceptional care and in a timely manner. _______________________________________________________________  If you have questions after your visit, please contact our office at [phone] between the hours of 8:30 a.m. and 5:00 p.m. Voicemails left after 4:30 p.m. will not be returned until the following business day. _______________________________________________________________  For prescription refill requests, have your pharmacy contact our office. _______________________________________________________________  Recommendations made by the consultant and any test results will be sent to your referring physician. _______________________________________________________________

## 2014-11-23 NOTE — Progress Notes (Signed)
Kristina Rodriguez presented for Portacath access and flush.  Proper placement of portacath confirmed by CXR.  Portacath located right chest wall accessed with  H 20 needle.  Good blood return present. Portacath flushed with 35ml NS and 500U/50ml Heparin and needle removed intact.  Procedure tolerated well and without incident.

## 2014-12-13 ENCOUNTER — Encounter (HOSPITAL_COMMUNITY): Payer: Self-pay | Admitting: Hematology & Oncology

## 2014-12-13 ENCOUNTER — Encounter (HOSPITAL_COMMUNITY): Payer: Commercial Managed Care - HMO | Attending: Hematology and Oncology | Admitting: Hematology & Oncology

## 2014-12-13 VITALS — BP 141/57 | HR 99 | Temp 98.7°F | Resp 18 | Wt 155.4 lb

## 2014-12-13 DIAGNOSIS — C549 Malignant neoplasm of corpus uteri, unspecified: Secondary | ICD-10-CM

## 2014-12-13 DIAGNOSIS — C55 Malignant neoplasm of uterus, part unspecified: Secondary | ICD-10-CM

## 2014-12-13 DIAGNOSIS — G620 Drug-induced polyneuropathy: Secondary | ICD-10-CM

## 2014-12-13 DIAGNOSIS — D509 Iron deficiency anemia, unspecified: Secondary | ICD-10-CM | POA: Diagnosis present

## 2014-12-13 LAB — CBC WITH DIFFERENTIAL/PLATELET
BASOS PCT: 0 % (ref 0–1)
Basophils Absolute: 0 10*3/uL (ref 0.0–0.1)
EOS ABS: 0.1 10*3/uL (ref 0.0–0.7)
Eosinophils Relative: 2 % (ref 0–5)
HCT: 33.4 % — ABNORMAL LOW (ref 36.0–46.0)
Hemoglobin: 10.4 g/dL — ABNORMAL LOW (ref 12.0–15.0)
LYMPHS ABS: 0.6 10*3/uL — AB (ref 0.7–4.0)
LYMPHS PCT: 14 % (ref 12–46)
MCH: 30 pg (ref 26.0–34.0)
MCHC: 31.1 g/dL (ref 30.0–36.0)
MCV: 96.3 fL (ref 78.0–100.0)
Monocytes Absolute: 0.3 10*3/uL (ref 0.1–1.0)
Monocytes Relative: 8 % (ref 3–12)
NEUTROS PCT: 76 % (ref 43–77)
Neutro Abs: 2.9 10*3/uL (ref 1.7–7.7)
PLATELETS: 208 10*3/uL (ref 150–400)
RBC: 3.47 MIL/uL — AB (ref 3.87–5.11)
RDW: 15.2 % (ref 11.5–15.5)
WBC: 3.8 10*3/uL — ABNORMAL LOW (ref 4.0–10.5)

## 2014-12-13 NOTE — Assessment & Plan Note (Signed)
Pleasant 74 year old female with a stage IIIA carcinosarcoma of the uterus. She was unable to complete her recommended chemotherapy secondary to the development of peripheral neuropathy. She did 2 cycles. She has not been back to see Dr. Alycia Rossetti since completion of chemotherapy.  I reviewed her laboratory studies with her from December, she finally agrees to a repeat CBC. In addition I advised her of the importance of following up with Dr. Alycia Rossetti and we will schedule her for an appointment. She does agree to go, and I advised her I would move her visit with me out some in order to help her with her financial issues. I will see her back in 4 months with a repeat CBC, ferritin, and CMP. She does have a history of iron deficiency anemia and agrees to an iron level at follow-up.

## 2014-12-13 NOTE — Progress Notes (Signed)
Kristina Rodriguez, [place] [address]   DIAGNOSIS: Uterine endometrial cancer, sarcoma   Staging form: Corpus Uteri - Carcinoma, AJCC 7th Edition     Pathologic: Stage IIIA (T3a, N0, M0) - Signed by Everitt Amber, MD on 06/29/2014  1. Stage III carcinosarcoma of the uterus, status post TAH, BSO, pelvic and para-aortic lymphadenectomy. Status post 2 cycles of carboplatin/Taxol resulting in severe peripheral neuropathy,  radiotherapy completed on 10/31/2014. 2. Iron deficiency anemia responsive to IV Feraheme   SUMMARY OF ONCOLOGIC HISTORY: Oncology History   74 year old woman with stage IIIA carcinosarcoma of the uterus (with microscopic ovarian involvement on final pathology, negative cervix, negative pelvic and para-aortic nodes).     Uterine endometrial cancer, sarcoma   06/12/2014 Initial Diagnosis Uterine endometrial cancer, sarcoma   06/16/2014 Definitive Surgery robotic hysterectomy, BSO, pelvic and para-aortic lymphadenectomy   07/13/2014 - 08/11/2014 Chemotherapy Paclitaxel and Carboplatin   08/24/2014 Adverse Reaction Significant peripheral neuropathy.  Chemotherapy held and cancelled.  She will move on to radiation therapy, sooner than planned.   09/20/2014 - 10/31/2014 Radiation Therapy 4500 cGy in 25 fractions to pelvis.  Proximal vagina/parametrial area boosted to cumulative dose of 5040 cGy.  Treated by Dr. Sondra Come.    CURRENT THERAPY:  INTERVAL HISTORY: Kristina Rodriguez 73 y.o. female returns for follow-up of his stage IIIa carcinosarcoma of the uterus. She received 2 cycles of carboplatin and Taxol but no additional therapy secondary to severe peripheral neuropathy. The patient reports she still has symptoms of neuropathy with numbness in her fingers and toes and balls of her feet. She reports she has almost fallen on several occasions because she feels unsteady.  She has not followed up with Dr. Alycia Rossetti since her last cycle of chemotherapy. She denies  any bleeding. She states she eats well and sleeps well. She denies any new pain. She still has occasional problems with foot swelling but states it is worse in the evening and gone in the mornings.  She is refusing a mammogram, blood work, or any imaging studies because of her current medical bills. Her last laboratory studies showed a worsening anemia.  She complains of urinary frequency and states it is worse since completing radiation. She has had a couple of episodes where she has not been able to make it to the bathroom.  MEDICAL HISTORY: Past Medical History  Diagnosis Date  . Diabetes mellitus, type II   . Hypertension   . Hyperlipidemia   . Osteoarthritis   . Iron deficiency anemia     attributed to long-term treatment with a PPI  . Anxiety and depression   . Hepatic steatosis 2006    mild  . Cholelithiasis 2006    Acute and chronic cholecystitis; laparoscopic cholecystectomy in 2006  . 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 Chronic pain   . Collagen vascular disease   . Anxiety   . Uterine cancer 7/15    carcinosarcoma    has Diabetes mellitus, type II; Hypertension; Hyperlipidemia; Osteoarthritis; Iron deficiency anemia; Other chronic pain; Depressive disorder, not elsewhere classified; Screening for malignant neoplasm of the cervix; Uterine mass; Uterine endometrial cancer, sarcoma; and Hypomagnesemia on her problem list.     is allergic to codeine.  Ms. Bonser does not currently have medications on file.  SURGICAL HISTORY: Past Surgical History  Procedure Laterality Date  . Tonsillectomy    . Carpal tunnel release  1994  Right  . Cholecystectomy, laparoscopic  2006    Dr. Romona Curls; cholelithiasis  . Colonoscopy  2003    Najeeb Rehman;iron deficiency anemia; normal study; hiatal hernia, gastritis, Schatzki's ring on EGD  . Breast biopsy  10/2012    Left; fibroadenoma  . Cholecystectomy    . Tubal ligation  1970s    removed    . Hysteroscopy w/d&c N/A 06/06/2014    Procedure: DILATATION AND CURETTAGE /HYSTEROSCOPY;  Surgeon: Jonnie Kind, MD;  Location: [facility];  Service: Gynecology;  Laterality: N/A;  . Polypectomy N/A 06/06/2014    Procedure: POLYPECTOMY;  Surgeon: Jonnie Kind, MD;  Location: [facility];  Service: Gynecology;  Laterality: N/A;  . Abdominal hysterectomy  06/16/14    robotic hysterectomy, BSO, pelvic and para-aortic lymphadenectomy for uterine carcinosarcoma    SOCIAL HISTORY: History   Social History  . Marital Status: Married    Spouse Name: N/A    Number of Children: N/A  . Years of Education: N/A   Occupational History  . Not on file.   Social History Main Topics  . Smoking status: Never Smoker   . Smokeless tobacco: Never Used  . Alcohol Use: No     Comment: Minimal use in the past; none in recent years  . Drug Use: No  . Sexual Activity: Yes    Birth Control/ Protection: Post-menopausal   Other Topics Concern  . Not on file   Social History Narrative    FAMILY HISTORY: Family History  Problem Relation Age of Onset  . Cancer Mother 3    pancreas and liver  . Diabetes Brother   . Congestive Heart Failure Father   . Hemochromatosis Brother   . Cancer Maternal Aunt   . Cancer Maternal Uncle     Review of Systems  Constitutional: Negative.   HENT: Negative.   Eyes: Negative.   Respiratory: Negative.   Cardiovascular: Negative.   Gastrointestinal: Negative.   Genitourinary: Positive for urgency and frequency.       At times "does not make it to the bathroom"  Musculoskeletal: Positive for back pain, joint pain and falls.       Rare fall, attributes to neuropathy in feet  Skin: Negative.   Neurological: Positive for tingling and sensory change.       Neuropathy in toes and fingers  Endo/Heme/Allergies: Negative.   Psychiatric/Behavioral: Negative.     PHYSICAL EXAMINATION  ECOG PERFORMANCE STATUS: 1 - Symptomatic but completely ambulatory  Filed Vitals:    12/13/14 1314  BP: 141/57  Pulse: 99  Temp: 98.7 F (37.1 C)  Resp: 18    Physical Exam  Constitutional: She is oriented to person, place, and time and well-developed, well-nourished, and in no distress.  HENT:  Head: Normocephalic and atraumatic.  Nose: Nose normal.  Mouth/Throat: Oropharynx is clear and moist. No oropharyngeal exudate.  Eyes: Conjunctivae and EOM are normal. Pupils are equal, round, and reactive to light. Right eye exhibits no discharge. Left eye exhibits no discharge. No scleral icterus.  Neck: Normal range of motion. Neck supple. No tracheal deviation present. No thyromegaly present.  Cardiovascular: Normal rate, regular rhythm and normal heart sounds.  Exam reveals no gallop and no friction rub.   No murmur heard. Pulmonary/Chest: Effort normal and breath sounds normal. She has no wheezes. She has no rales.  Abdominal: Soft. Bowel sounds are normal. She exhibits no distension and no mass. There is tenderness. There is guarding. There is no rebound.  Complains of discomfort  over lower abdomen with palpation. No palpable masses  Musculoskeletal: Normal range of motion. She exhibits no edema.  Lymphadenopathy:    She has no cervical adenopathy.  Neurological: She is alert and oriented to person, place, and time. She has normal reflexes. No cranial nerve deficit. Gait normal. Coordination normal.  Skin: Skin is warm and dry. No rash noted.  Psychiatric: Mood, memory, affect and judgment normal.  Nursing note and vitals reviewed.   LABORATORY DATA:  CBC    Component Value Date/Time   WBC 3.8* 12/13/2014 1430   RBC 3.47* 12/13/2014 1430   RBC 4.05 01/26/2014 0920   HGB 10.4* 12/13/2014 1430   HCT 33.4* 12/13/2014 1430   PLT 208 12/13/2014 1430   MCV 96.3 12/13/2014 1430   MCH 30.0 12/13/2014 1430   MCHC 31.1 12/13/2014 1430   RDW 15.2 12/13/2014 1430   LYMPHSABS 0.6* 12/13/2014 1430   MONOABS 0.3 12/13/2014 1430   EOSABS 0.1 12/13/2014 1430    BASOSABS 0.0 12/13/2014 1430   CMP     Component Value Date/Time   NA 133* 10/24/2014 1341   K 4.1 10/24/2014 1341   CL 94* 10/24/2014 1341   CO2 25 10/24/2014 1341   GLUCOSE 219* 10/24/2014 1341   BUN 19 10/24/2014 1341   CREATININE 0.88 10/24/2014 1341   CALCIUM 9.3 10/24/2014 1341   PROT 6.9 10/24/2014 1341   ALBUMIN 3.4* 10/24/2014 1341   AST 14 10/24/2014 1341   ALT 10 10/24/2014 1341   ALKPHOS 59 10/24/2014 1341   BILITOT 0.4 10/24/2014 1341   GFRNONAA 64* 10/24/2014 1341   GFRAA 74* 10/24/2014 1341      ASSESSMENT and THERAPY PLAN:    Uterine endometrial cancer, sarcoma Pleasant 74 year old female with a stage IIIA carcinosarcoma of the uterus. She was unable to complete her recommended chemotherapy secondary to the development of peripheral neuropathy. She did 2 cycles. She has not been back to see Dr. Alycia Rossetti since completion of chemotherapy.  I reviewed her laboratory studies with her from December, she finally agrees to a repeat CBC. In addition I advised her of the importance of following up with Dr. Alycia Rossetti and we will schedule her for an appointment. She does agree to go, and I advised her I would move her visit with me out some in order to help her with her financial issues. I will see her back in 4 months with a repeat CBC, ferritin, and CMP. She does have a history of iron deficiency anemia and agrees to an iron level at follow-up.    All questions were answered. The patient knows to call the clinic with any problems, questions or concerns. We can certainly see the patient much sooner if necessary.  Molli Hazard 12/13/2014

## 2014-12-14 LAB — FOLATE: Folate: >20 ng/mL

## 2014-12-14 LAB — VITAMIN B12: Vitamin B-12: 263 pg/mL (ref 211–911)

## 2015-01-04 ENCOUNTER — Encounter (HOSPITAL_COMMUNITY): Payer: Commercial Managed Care - HMO | Attending: Hematology & Oncology

## 2015-01-04 ENCOUNTER — Encounter (HOSPITAL_COMMUNITY): Payer: Self-pay

## 2015-01-04 DIAGNOSIS — C549 Malignant neoplasm of corpus uteri, unspecified: Secondary | ICD-10-CM

## 2015-01-04 DIAGNOSIS — Z452 Encounter for adjustment and management of vascular access device: Secondary | ICD-10-CM

## 2015-01-04 MED ORDER — HEPARIN SOD (PORK) LOCK FLUSH 100 UNIT/ML IV SOLN
500.0000 [IU] | Freq: Once | INTRAVENOUS | Status: AC
  Administered 2015-01-04: 500 [IU] via INTRAVENOUS
  Filled 2015-01-04: qty 5

## 2015-01-04 MED ORDER — SODIUM CHLORIDE 0.9 % IJ SOLN
10.0000 mL | INTRAMUSCULAR | Status: DC | PRN
  Administered 2015-01-04: 10 mL via INTRAVENOUS
  Filled 2015-01-04: qty 10

## 2015-01-04 NOTE — Progress Notes (Signed)
Kristina Rodriguez presented for Portacath access and flush. Portacath located rt chest wall accessed with  H 20 needle. Good blood return present. Portacath flushed with 31ml NS and 500U/38ml Heparin and needle removed intact. Procedure without incident. Patient tolerated procedure well.

## 2015-02-15 ENCOUNTER — Encounter (HOSPITAL_COMMUNITY): Payer: Self-pay

## 2015-02-15 ENCOUNTER — Encounter (HOSPITAL_COMMUNITY): Payer: Commercial Managed Care - HMO | Attending: Hematology & Oncology

## 2015-02-15 DIAGNOSIS — Z452 Encounter for adjustment and management of vascular access device: Secondary | ICD-10-CM | POA: Diagnosis not present

## 2015-02-15 DIAGNOSIS — C55 Malignant neoplasm of uterus, part unspecified: Secondary | ICD-10-CM | POA: Diagnosis not present

## 2015-02-15 MED ORDER — HEPARIN SOD (PORK) LOCK FLUSH 100 UNIT/ML IV SOLN
500.0000 [IU] | Freq: Once | INTRAVENOUS | Status: AC
  Administered 2015-02-15: 500 [IU] via INTRAVENOUS
  Filled 2015-02-15: qty 5

## 2015-02-15 MED ORDER — SODIUM CHLORIDE 0.9 % IJ SOLN
10.0000 mL | INTRAMUSCULAR | Status: DC | PRN
  Administered 2015-02-15: 10 mL via INTRAVENOUS
  Filled 2015-02-15: qty 10

## 2015-02-15 NOTE — Progress Notes (Signed)
Kristina Rodriguez presented for Portacath access and flush. Portacath located right chest wall accessed with  H 20 needle. Good blood return present. Portacath flushed with 10ml NS and 500U/2ml Heparin and needle removed intact. Procedure without incident. Patient tolerated procedure well.

## 2015-02-15 NOTE — Patient Instructions (Signed)
[place] at [facility] Discharge Instructions  RECOMMENDATIONS MADE BY THE CONSULTANT AND ANY TEST RESULTS WILL BE SENT TO YOUR REFERRING PHYSICIAN.  You received your port flush today. Call for any concerns or questions.   Thank you for choosing [place] at [facility] to provide your oncology and hematology care.  To afford each patient quality time with our provider, please arrive at least 15 minutes before your scheduled appointment time.    You need to re-schedule your appointment should you arrive 10 or more minutes late.  We strive to give you quality time with our providers, and arriving late affects you and other patients whose appointments are after yours.  Also, if you no show three or more times for appointments you may be dismissed from the clinic at the providers discretion.     Again, thank you for choosing [facility].  Our hope is that these requests will decrease the amount of time that you wait before being seen by our physicians.       _____________________________________________________________  Should you have questions after your visit to [facility], please contact our office at [phone] between the hours of 8:30 a.m. and 4:30 p.m.  Voicemails left after 4:30 p.m. will not be returned until the following business day.  For prescription refill requests, have your pharmacy contact our office.

## 2015-03-12 ENCOUNTER — Encounter: Payer: Self-pay | Admitting: Gynecologic Oncology

## 2015-03-12 ENCOUNTER — Ambulatory Visit: Payer: Commercial Managed Care - HMO | Attending: Gynecologic Oncology | Admitting: Gynecologic Oncology

## 2015-03-12 ENCOUNTER — Ambulatory Visit: Payer: Commercial Managed Care - HMO

## 2015-03-12 VITALS — BP 129/60 | HR 91 | Temp 98.4°F | Resp 20 | Ht 64.0 in | Wt 157.0 lb

## 2015-03-12 DIAGNOSIS — R1900 Intra-abdominal and pelvic swelling, mass and lump, unspecified site: Secondary | ICD-10-CM | POA: Diagnosis present

## 2015-03-12 DIAGNOSIS — Z90710 Acquired absence of both cervix and uterus: Secondary | ICD-10-CM | POA: Diagnosis not present

## 2015-03-12 DIAGNOSIS — Z90722 Acquired absence of ovaries, bilateral: Secondary | ICD-10-CM | POA: Insufficient documentation

## 2015-03-12 DIAGNOSIS — Z8542 Personal history of malignant neoplasm of other parts of uterus: Secondary | ICD-10-CM | POA: Diagnosis not present

## 2015-03-12 DIAGNOSIS — R197 Diarrhea, unspecified: Secondary | ICD-10-CM | POA: Diagnosis not present

## 2015-03-12 DIAGNOSIS — T451X5S Adverse effect of antineoplastic and immunosuppressive drugs, sequela: Secondary | ICD-10-CM | POA: Insufficient documentation

## 2015-03-12 DIAGNOSIS — G620 Drug-induced polyneuropathy: Secondary | ICD-10-CM | POA: Insufficient documentation

## 2015-03-12 DIAGNOSIS — R1909 Other intra-abdominal and pelvic swelling, mass and lump: Secondary | ICD-10-CM

## 2015-03-12 DIAGNOSIS — C549 Malignant neoplasm of corpus uteri, unspecified: Secondary | ICD-10-CM

## 2015-03-12 DIAGNOSIS — Z923 Personal history of irradiation: Secondary | ICD-10-CM | POA: Diagnosis not present

## 2015-03-12 DIAGNOSIS — C55 Malignant neoplasm of uterus, part unspecified: Secondary | ICD-10-CM | POA: Diagnosis not present

## 2015-03-12 DIAGNOSIS — Z9221 Personal history of antineoplastic chemotherapy: Secondary | ICD-10-CM | POA: Insufficient documentation

## 2015-03-12 NOTE — Progress Notes (Signed)
GYNECOLOGIC ONCOLOGY FOLLOWUP NOTE  Assessment:    74 y.o. year old with Stage IIIA Grade 3 uterine carcinosarcoma.   S/p robotic hysterectomy, BSO, pelvic and para-aortic lymphadenectomy on 06/16/14. Pathology revealed positive microscopic ovarian involvement. She's status post 2 cycles of paclitaxel and carboplatin and WPRT with vaginal brachytherapy as part of an incomplete regimen of sandwich therapy. Unfortunately toxicities limited her ability to receive full treatment.  She has a new left sided pelvic mass on today's examination. I believe this is likely either a lymphocyst vs recurrence of her carcinosarcoma.   Plan: CT of the abdomen and pelvis to better differentiate the mass. If it appears very benign, I recommend no intervention (she has no mass effects from it). If it appears consistent with recurrence, I  Would recommend chemotherapy. If it appears equivocal, she may benefit from a PET to differentiate its etiology.   HPI:  Kristina Rodriguez is a 74 y.o. year old initially seen in consultation requested by Dr Glo Herring on 06/12/14 for carcinosarcoma of the endometrium.  She then underwent a robotic assisted total laparoscopic hysterectomy and BSO with pelvic and para-aortic lymphadenectomy on 06/16/14 with Dr Nancy Marus at [facility] without complications.  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  She had her first cycle of chemotherapy with paclitaxel and carboplatin on 07/13/2014 or stage III disease. She completed only 2 cycles of chemotherapy (of a planned 6) prior to developing grade IV toxicity with peripheral neuropathy of the fingertips and toes. Her last cycle was on 07/13/14. She went on to receive WPRT and vaginal  brachytherapy (4500cGY in 25 fractions to pelvis, and proximal vagina/parametrial area boosted to 5040cGy). She tolerated radiation well. She did not go on to receive additional adjuvant chemotherapy.  She has persistent neuropathy in fingertips and toes treated with Gabapentin (600 TID) with minimal relief.  She states that she has a sensation of rectal fullness which predates her cancer diagnosis and treatment. She does have some diarrhea that has gotten worse in the past few months. It is worse after eating. She has some suprapubic tenderness in her low abdominal incision line. She denies any vaginal bleeding. She's had no bright red blood per rectum with her stools. She states her leg weakness is improved. She continues to be sexually active with no issues since radiation. She is using her dilator.    Review of systems: Constitutional:  She has weight loss. She has no fever or chills. Eyes: No blurred vision Ears, Nose, Mouth, Throat: No dizziness, headaches or changes in hearing. No mouth sores. Cardiovascular: No chest pain, palpitations or edema. Respiratory:  No shortness of breath Gastrointestinal: She has diarrhea. She denies any nausea or vomiting. She denies blood in her stool. Genitourinary:  +pelvic pain, no pelvic pressure or changes in her urinary function. She has some urge incontinence. She has no irregular vaginal bleeding or vaginal discharge Musculoskeletal: no muscle weakness and joint pains Neurological:  Denies dizziness or headaches. + neuropathy, + numbness or tingling fingertips and toes.   Physical Exam: Blood pressure 129/60, pulse 91, temperature 98.4 F (36.9 C), temperature source Oral, resp. rate 20, height 5\' 4"  (1.626 m), weight 157 lb (71.215 kg). General: Well dressed, well nourished in no apparent distress.    Abdomen:  Soft, nontender, nondistended.  No palpable masses.  No hepatosplenomegaly.  No ascites. Normal bowel sounds.  No hernias.  Incisions  are in tact and healed. Some tenderness present in the midline incision. No masses appreciated.  Genitourinary: External genitalia within normal limits. Speculum examination reveals an atrophic vagina with minimal radiation changes. The vaginal cuff is visually intact. There is no evidence of any blood or lesions. Bimanual examination reveals a 8cm (approx) cystic fullness in the left pelvis along pelvic side wall. It is smooth and regular and slightly compressible. It is better appreciated on rectal exam. The rectal mucosa is smooth. There is no cul de sac nodularity.   Extremities: No cyanosis, clubbing or edema.   Musculoskeletal: No pain, normal strength and range of motion.   Donaciano Eva, MD

## 2015-03-16 ENCOUNTER — Ambulatory Visit (HOSPITAL_COMMUNITY)
Admission: RE | Admit: 2015-03-16 | Discharge: 2015-03-16 | Disposition: A | Discharge status: Home / Self Care | Payer: Commercial Managed Care - HMO | Source: Ambulatory Visit | Attending: Gynecologic Oncology | Admitting: Gynecologic Oncology

## 2015-03-16 DIAGNOSIS — C541 Malignant neoplasm of endometrium: Secondary | ICD-10-CM | POA: Diagnosis present

## 2015-03-16 DIAGNOSIS — Z923 Personal history of irradiation: Secondary | ICD-10-CM | POA: Insufficient documentation

## 2015-03-16 DIAGNOSIS — Z9221 Personal history of antineoplastic chemotherapy: Secondary | ICD-10-CM | POA: Insufficient documentation

## 2015-03-16 DIAGNOSIS — R197 Diarrhea, unspecified: Secondary | ICD-10-CM | POA: Diagnosis not present

## 2015-03-16 DIAGNOSIS — C549 Malignant neoplasm of corpus uteri, unspecified: Secondary | ICD-10-CM

## 2015-03-16 MED ORDER — IOHEXOL 300 MG/ML  SOLN
100.0000 mL | Freq: Once | INTRAMUSCULAR | Status: AC | PRN
  Administered 2015-03-16: 100 mL via INTRAVENOUS

## 2015-03-19 ENCOUNTER — Telehealth: Payer: Self-pay | Admitting: *Deleted

## 2015-03-19 NOTE — Telephone Encounter (Signed)
Per Dr. Denman George, patient notified that the CT scan showed a lymphocyst on the left and that this is what Dr. Denman George felt when she did her pelvic exam. Told patient this is a benign process and is not cancer. Told patient that if it starts to cause her symptoms by pressing on her pelvic organs, we could drain the cyst, otherwise it is safe to leave it alone. Patient given office contact number and is agreeable to call back if she develops any pelvic pressure or other symptoms. Patient denied any other concerns at this time.

## 2015-03-29 ENCOUNTER — Ambulatory Visit (HOSPITAL_COMMUNITY)
Admission: RE | Admit: 2015-03-29 | Discharge: 2015-03-29 | Disposition: A | Discharge status: Home / Self Care | Payer: Commercial Managed Care - HMO | Source: Ambulatory Visit | Attending: Internal Medicine | Admitting: Internal Medicine

## 2015-03-29 ENCOUNTER — Encounter (HOSPITAL_COMMUNITY): Payer: Commercial Managed Care - HMO | Attending: Hematology & Oncology

## 2015-03-29 ENCOUNTER — Encounter (HOSPITAL_COMMUNITY): Payer: Self-pay

## 2015-03-29 ENCOUNTER — Other Ambulatory Visit (HOSPITAL_COMMUNITY): Payer: Self-pay | Admitting: Internal Medicine

## 2015-03-29 VITALS — BP 135/55 | HR 89 | Temp 97.7°F | Resp 20

## 2015-03-29 DIAGNOSIS — M810 Age-related osteoporosis without current pathological fracture: Secondary | ICD-10-CM | POA: Diagnosis not present

## 2015-03-29 DIAGNOSIS — M47816 Spondylosis without myelopathy or radiculopathy, lumbar region: Secondary | ICD-10-CM | POA: Insufficient documentation

## 2015-03-29 DIAGNOSIS — C549 Malignant neoplasm of corpus uteri, unspecified: Secondary | ICD-10-CM

## 2015-03-29 DIAGNOSIS — M25552 Pain in left hip: Secondary | ICD-10-CM

## 2015-03-29 DIAGNOSIS — M5136 Other intervertebral disc degeneration, lumbar region: Secondary | ICD-10-CM | POA: Insufficient documentation

## 2015-03-29 DIAGNOSIS — Z452 Encounter for adjustment and management of vascular access device: Secondary | ICD-10-CM | POA: Diagnosis not present

## 2015-03-29 DIAGNOSIS — M769 Unspecified enthesopathy, lower limb, excluding foot: Secondary | ICD-10-CM | POA: Insufficient documentation

## 2015-03-29 DIAGNOSIS — M545 Low back pain: Secondary | ICD-10-CM | POA: Diagnosis present

## 2015-03-29 DIAGNOSIS — Z95828 Presence of other vascular implants and grafts: Secondary | ICD-10-CM

## 2015-03-29 MED ORDER — HEPARIN SOD (PORK) LOCK FLUSH 100 UNIT/ML IV SOLN
INTRAVENOUS | Status: AC
  Filled 2015-03-29: qty 5

## 2015-03-29 MED ORDER — SODIUM CHLORIDE 0.9 % IJ SOLN
10.0000 mL | INTRAMUSCULAR | Status: DC | PRN
  Administered 2015-03-29: 10 mL via INTRAVENOUS
  Filled 2015-03-29: qty 10

## 2015-03-29 MED ORDER — HEPARIN SOD (PORK) LOCK FLUSH 100 UNIT/ML IV SOLN
500.0000 [IU] | Freq: Once | INTRAVENOUS | Status: AC
  Administered 2015-03-29: 500 [IU] via INTRAVENOUS

## 2015-03-29 NOTE — Progress Notes (Signed)
Kristina Rodriguez presented for Portacath access and flush. Portacath located right chest wall accessed with  H 20 needle. Good blood return present. Portacath flushed with 1ml NS and 500U/58ml Heparin and needle removed intact. Procedure without incident. Patient tolerated procedure well.

## 2015-03-29 NOTE — Patient Instructions (Signed)
[place] at [facility] Discharge Instructions  RECOMMENDATIONS MADE BY THE CONSULTANT AND ANY TEST RESULTS WILL BE SENT TO YOUR REFERRING PHYSICIAN.  You received your port flush today. Will see you at your next appointment. Call for concerns or questions.  Thank you for choosing [place] at [facility] to provide your oncology and hematology care.  To afford each patient quality time with our provider, please arrive at least 15 minutes before your scheduled appointment time.    You need to re-schedule your appointment should you arrive 10 or more minutes late.  We strive to give you quality time with our providers, and arriving late affects you and other patients whose appointments are after yours.  Also, if you no show three or more times for appointments you may be dismissed from the clinic at the providers discretion.     Again, thank you for choosing [facility].  Our hope is that these requests will decrease the amount of time that you wait before being seen by our physicians.       _____________________________________________________________  Should you have questions after your visit to [facility], please contact our office at [phone] between the hours of 8:30 a.m. and 4:30 p.m.  Voicemails left after 4:30 p.m. will not be returned until the following business day.  For prescription refill requests, have your pharmacy contact our office.

## 2015-04-13 ENCOUNTER — Ambulatory Visit (HOSPITAL_COMMUNITY): Payer: Commercial Managed Care - HMO | Admitting: Hematology & Oncology

## 2015-04-13 ENCOUNTER — Other Ambulatory Visit (HOSPITAL_COMMUNITY): Payer: Commercial Managed Care - HMO

## 2015-04-18 ENCOUNTER — Encounter (HOSPITAL_COMMUNITY): Payer: Self-pay | Admitting: Hematology & Oncology

## 2015-04-18 ENCOUNTER — Encounter (HOSPITAL_COMMUNITY): Payer: Commercial Managed Care - HMO

## 2015-04-18 ENCOUNTER — Encounter (HOSPITAL_COMMUNITY): Payer: Commercial Managed Care - HMO | Attending: Hematology & Oncology | Admitting: Hematology & Oncology

## 2015-04-18 VITALS — BP 139/61 | HR 89 | Temp 98.1°F | Resp 18 | Wt 157.0 lb

## 2015-04-18 DIAGNOSIS — M549 Dorsalgia, unspecified: Secondary | ICD-10-CM | POA: Diagnosis not present

## 2015-04-18 DIAGNOSIS — G620 Drug-induced polyneuropathy: Secondary | ICD-10-CM | POA: Diagnosis not present

## 2015-04-18 DIAGNOSIS — R29898 Other symptoms and signs involving the musculoskeletal system: Secondary | ICD-10-CM | POA: Insufficient documentation

## 2015-04-18 DIAGNOSIS — C549 Malignant neoplasm of corpus uteri, unspecified: Secondary | ICD-10-CM

## 2015-04-18 DIAGNOSIS — E1165 Type 2 diabetes mellitus with hyperglycemia: Secondary | ICD-10-CM

## 2015-04-18 DIAGNOSIS — M542 Cervicalgia: Secondary | ICD-10-CM | POA: Insufficient documentation

## 2015-04-18 DIAGNOSIS — D509 Iron deficiency anemia, unspecified: Secondary | ICD-10-CM | POA: Diagnosis present

## 2015-04-18 LAB — COMPREHENSIVE METABOLIC PANEL
ALBUMIN: 3.3 g/dL — AB (ref 3.5–5.0)
ALK PHOS: 97 U/L (ref 38–126)
ALT: 15 U/L (ref 14–54)
ANION GAP: 13 (ref 5–15)
AST: 12 U/L — ABNORMAL LOW (ref 15–41)
BILIRUBIN TOTAL: 0.8 mg/dL (ref 0.3–1.2)
BUN: 28 mg/dL — AB (ref 6–20)
CALCIUM: 8.4 mg/dL — AB (ref 8.9–10.3)
CHLORIDE: 89 mmol/L — AB (ref 101–111)
CO2: 29 mmol/L (ref 22–32)
Creatinine, Ser: 0.99 mg/dL (ref 0.44–1.00)
GFR calc Af Amer: >60 mL/min (ref >60)
GFR calc non Af Amer: 55 mL/min — ABNORMAL LOW (ref >60)
Glucose, Bld: 369 mg/dL — ABNORMAL HIGH (ref 65–99)
Potassium: 4.4 mmol/L (ref 3.5–5.1)
Sodium: 131 mmol/L — ABNORMAL LOW (ref 135–145)
Total Protein: 6.6 g/dL (ref 6.5–8.1)

## 2015-04-18 LAB — CBC WITH DIFFERENTIAL/PLATELET
Basophils Absolute: 0 10*3/uL (ref 0.0–0.1)
Basophils Relative: 0 % (ref 0–1)
EOS ABS: 0.1 10*3/uL (ref 0.0–0.7)
EOS PCT: 1 % (ref 0–5)
HCT: 35.2 % — ABNORMAL LOW (ref 36.0–46.0)
Hemoglobin: 11.4 g/dL — ABNORMAL LOW (ref 12.0–15.0)
LYMPHS ABS: 0.6 10*3/uL — AB (ref 0.7–4.0)
Lymphocytes Relative: 5 % — ABNORMAL LOW (ref 12–46)
MCH: 28.9 pg (ref 26.0–34.0)
MCHC: 32.4 g/dL (ref 30.0–36.0)
MCV: 89.1 fL (ref 78.0–100.0)
Monocytes Absolute: 0.4 10*3/uL (ref 0.1–1.0)
Monocytes Relative: 4 % (ref 3–12)
Neutro Abs: 10 10*3/uL — ABNORMAL HIGH (ref 1.7–7.7)
Neutrophils Relative %: 90 % — ABNORMAL HIGH (ref 43–77)
Platelets: 171 10*3/uL (ref 150–400)
RBC: 3.95 MIL/uL (ref 3.87–5.11)
RDW: 15.5 % (ref 11.5–15.5)
WBC: 11 10*3/uL — ABNORMAL HIGH (ref 4.0–10.5)

## 2015-04-18 LAB — FERRITIN: FERRITIN: 328 ng/mL — AB (ref 11–307)

## 2015-04-18 MED ORDER — CYCLOBENZAPRINE HCL 10 MG PO TABS: 10.0000 mg | ORAL_TABLET | Freq: Three times a day (TID) | ORAL | Status: DC | PRN

## 2015-04-18 NOTE — Patient Instructions (Addendum)
[place] at [facility] Discharge Instructions  RECOMMENDATIONS MADE BY THE CONSULTANT AND ANY TEST RESULTS WILL BE SENT TO YOUR REFERRING PHYSICIAN.  Lab work today. Return in 4 months for office visit. Port flushes as schedule.  Thank you for choosing [place] at [facility] to provide your oncology and hematology care.  To afford each patient quality time with our provider, please arrive at least 15 minutes before your scheduled appointment time.    You need to re-schedule your appointment should you arrive 10 or more minutes late.  We strive to give you quality time with our providers, and arriving late affects you and other patients whose appointments are after yours.  Also, if you no show three or more times for appointments you may be dismissed from the clinic at the providers discretion.     Again, thank you for choosing [facility].  Our hope is that these requests will decrease the amount of time that you wait before being seen by our physicians.       _____________________________________________________________  Should you have questions after your visit to [facility], please contact our office at [phone] between the hours of 8:30 a.m. and 4:30 p.m.  Voicemails left after 4:30 p.m. will not be returned until the following business day.  For prescription refill requests, have your pharmacy contact our office.

## 2015-04-18 NOTE — Progress Notes (Signed)
Kristina Rodriguez, [place] [address]   DIAGNOSIS: Uterine endometrial cancer, sarcoma   Staging form: Corpus Uteri - Carcinoma, AJCC 7th Edition     Pathologic: Stage IIIA (T3a, N0, M0) - Signed by Everitt Amber, MD on 06/29/2014  1. Stage III carcinosarcoma of the uterus, status post TAH, BSO, pelvic and para-aortic lymphadenectomy. Status post 2 cycles of carboplatin/Taxol resulting in severe peripheral neuropathy,  radiotherapy completed on 10/31/2014. 2. Iron deficiency anemia responsive to IV Feraheme   SUMMARY OF ONCOLOGIC HISTORY: Oncology History   74 year old woman with stage IIIA carcinosarcoma of the uterus (with microscopic ovarian involvement on final pathology, negative cervix, negative pelvic and para-aortic nodes).     Uterine endometrial cancer, sarcoma   06/12/2014 Initial Diagnosis Uterine endometrial cancer, sarcoma   06/16/2014 Definitive Surgery robotic hysterectomy, BSO, pelvic and para-aortic lymphadenectomy   07/13/2014 - 08/11/2014 Chemotherapy Paclitaxel and Carboplatin   08/24/2014 Adverse Reaction Significant peripheral neuropathy.  Chemotherapy held and cancelled.  She will move on to radiation therapy, sooner than planned.   09/20/2014 - 10/31/2014 Radiation Therapy 4500 cGy in 25 fractions to pelvis.  Proximal vagina/parametrial area boosted to cumulative dose of 5040 cGy.  Treated by Dr. Sondra Rodriguez.    CURRENT THERAPY:  INTERVAL HISTORY: Kristina Rodriguez 74 y.o. female returns for follow-up of his stage IIIa carcinosarcoma of the uterus. She received 2 cycles of carboplatin and Taxol but no additional therapy secondary to severe peripheral neuropathy. The patient reports she still has symptoms of neuropathy with numbness in her fingers and toes and balls of her feet. .  She has seen Dr. Denman Rodriguez at [facility] and underwent CT imaging that showed a post operative lymphocele. She is still refusing mammography.  She states she's been having back pain  for weeks. She notes she has a history of back pain. The pain has been occurring in a "joint" and it comes and goes. It has moved from her left joint to right side and moved to her "backbone".  Her previous doctor stated that she has several disc spaces without padding and that this may be the cause of the pain.   She sees Dr. Willey Rodriguez tomorrow. She has several flexeril left that Kristina Rodriguez (our PA) has given her in the past. She would like to try these for her back pain.   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     is allergic to codeine.  Kristina Rodriguez had no medications administered during this visit.  SURGICAL HISTORY: Past Surgical History  Procedure Laterality Date   Tonsillectomy     Carpal tunnel release  1994  Right   Cholecystectomy, laparoscopic  2006    Dr. Romona Rodriguez; cholelithiasis   Colonoscopy  2003    Kristina Rodriguez;iron deficiency anemia; normal study; hiatal hernia, gastritis, Schatzki's ring on EGD   Breast biopsy  10/2012    Left; fibroadenoma   Cholecystectomy     Tubal ligation  1970s    removed   Hysteroscopy w/d&c N/A 06/06/2014    Procedure:  DILATATION AND CURETTAGE /HYSTEROSCOPY;  Surgeon: Kristina Kind, MD;  Location: [facility];  Service: Gynecology;  Laterality: N/A;   Polypectomy N/A 06/06/2014    Procedure: POLYPECTOMY;  Surgeon: Kristina Kind, MD;  Location: [facility];  Service: Gynecology;  Laterality: N/A;   Abdominal hysterectomy  06/16/14    robotic hysterectomy, BSO, pelvic and para-aortic lymphadenectomy for uterine carcinosarcoma    SOCIAL HISTORY: History   Social History   Marital Status: Married    Spouse Name: N/A   Number of Children: N/A   Years of Education: N/A   Occupational History   Not on file.   Social History Main Topics   Smoking status: Never Smoker    Smokeless tobacco: Never Used   Alcohol Use: No     Comment: Minimal use in the past; none in recent years   Drug Use: No   Sexual Activity: Yes    Birth Control/ Protection: Post-menopausal   Other Topics Concern   Not on file   Social History Narrative    FAMILY HISTORY: Family History  Problem Relation Age of Onset   Cancer Mother 73    pancreas and liver   Diabetes Brother    Congestive Heart Failure Father    Hemochromatosis Brother    Cancer Maternal Aunt    Cancer Maternal Uncle     Review of Systems  Constitutional: Negative.   HENT: Negative.   Eyes: Negative.   Respiratory: Negative.   Cardiovascular: Negative.   Gastrointestinal: Positive for diarrhea and constipation.  Genitourinary: Positive for urgency and frequency.       At times "does not make it to the bathroom"  Musculoskeletal: Positive for back pain, joint pain and falls.       Rare fall, attributes to neuropathy in feet  Skin: Negative.   Neurological: Positive for tingling and sensory change.       Neuropathy in toes and fingers, residual from chemo  Endo/Heme/Allergies: Negative.   Psychiatric/Behavioral: Negative.   14 point review of systems was performed and is negative except as detailed under history of present illness and  above   PHYSICAL EXAMINATION  ECOG PERFORMANCE STATUS: 1 - Symptomatic but completely ambulatory  Filed Vitals:   04/18/15 0957  BP: 139/61  Pulse: 89  Temp: 98.1 F (36.7 C)  Resp: 18    Physical Exam  Constitutional: She is oriented to person, place, and time and well-developed, well-nourished, and in no distress.  In wheelchair, able to get on table with assistance, ambulating causes pain HENT:  Head: Normocephalic and atraumatic.  Nose: Nose normal.  Mouth/Throat: Oropharynx is clear and moist. No oropharyngeal exudate.  Eyes: Conjunctivae and EOM are normal. Pupils are equal, round, and reactive to light. Right eye exhibits no discharge. Left eye exhibits no discharge. No scleral icterus.  Neck: Normal range of motion. Neck supple. No tracheal deviation present. No thyromegaly present.  Cardiovascular: Normal rate, regular rhythm and normal heart sounds.  Exam reveals no gallop and no friction rub.   No murmur heard. Pulmonary/Chest: Effort normal and breath sounds normal.  She has no wheezes. She has no rales.  Abdominal: Soft. Bowel sounds are normal. She exhibits no distension and no mass. There is no rebound.  Musculoskeletal: Normal range of motion. She exhibits no edema.  Lymphadenopathy:    She has no cervical adenopathy.  Neurological: She is alert and oriented to person, place, and time. She has normal reflexes. No cranial nerve deficit. Coordination normal.  Skin: Skin is warm and dry. No rash noted.  Psychiatric: Mood, memory, affect and judgment normal.  Nursing note and vitals reviewed.   LABORATORY DATA:  CBC    Component Value Date/Time   WBC 11.0* 04/18/2015 1117   RBC 3.95 04/18/2015 1117   RBC 4.05 01/26/2014 0920   HGB 11.4* 04/18/2015 1117   HCT 35.2* 04/18/2015 1117   PLT 171 04/18/2015 1117   MCV 89.1 04/18/2015 1117   MCH 28.9 04/18/2015 1117   MCHC 32.4 04/18/2015 1117   RDW 15.5 04/18/2015 1117   LYMPHSABS 0.6* 04/18/2015 1117    MONOABS 0.4 04/18/2015 1117   EOSABS 0.1 04/18/2015 1117   BASOSABS 0.0 04/18/2015 1117   CMP     Component Value Date/Time   NA 131* 04/18/2015 1117   K 4.4 04/18/2015 1117   CL 89* 04/18/2015 1117   CO2 29 04/18/2015 1117   GLUCOSE 369* 04/18/2015 1117   BUN 28* 04/18/2015 1117   CREATININE 0.99 04/18/2015 1117   CALCIUM 8.4* 04/18/2015 1117   PROT 6.6 04/18/2015 1117   ALBUMIN 3.3* 04/18/2015 1117   AST 12* 04/18/2015 1117   ALT 15 04/18/2015 1117   ALKPHOS 97 04/18/2015 1117   BILITOT 0.8 04/18/2015 1117   GFRNONAA 55* 04/18/2015 1117   GFRAA >60 04/18/2015 1117   RADIOLOGY: I have reviewed the images detailed below and agree with the report.  03/16/2015 CLINICAL DATA: Followup endometrial carcinosarcoma status post chemotherapy and radiation therapy. Diarrhea.  EXAM: CT ABDOMEN AND PELVIS WITH CONTRAST IMPRESSION: Interval hysterectomy. 9 cm fluid collection along the left pelvic sidewall and iliac vessels, consistent with postop lymphocele.  No evidence of residual or metastatic carcinoma within the pelvis or abdomen.  New patchy airspace disease seen in right middle lobe, suspicious for infectious or inflammatory process. Recommend clinical correlation and chest radiograph for further evaluation.   Electronically Signed  By: Earle Gell M.D.  On: 03/16/2015 11:51   ASSESSMENT and THERAPY PLAN:  Stage III carcinosarcoma of the uterus, status post TAH, BSO, pelvic and para-aortic lymphadenectomy. Status post 2 cycles of carboplatin/Taxol resulting in severe peripheral neuropathy, radiotherapy completed on 10/31/2014.   Iron deficiency anemia responsive to IV Feraheme  Post-op Lymphocele New onset back pain Hyperglycemia/diabetes  She has followed up with Dr. Denman Rodriguez and had a physical exam and follow-up CT imaging. She has no obvious recurrence of her disease.  I have refilled her flexeril and instructed her on its use. She sees Dr. Willey Rodriguez tomorrow  and was advised to discuss her back pain with him if the flexeril does not help alleviate her pain. We will get a copy of her labs to Dr. Willey Rodriguez given her hyperglycemia. She is on several diabetes medications.   I will advise her of her ferritin level when available. We will see her back in 4 months with repeat physical exam, including breast exam.  I will re-address screening mammography at follow-up again.  Orders Placed This Encounter  Procedures   CBC with Differential    Standing Status: Future     Number of Occurrences: 1  Standing Expiration Date: 04/17/2016   Comprehensive metabolic panel    Standing Status: Future     Number of Occurrences: 1     Standing Expiration Date: 04/17/2016   CA 125    Standing Status: Future     Number of Occurrences: 1     Standing Expiration Date: 04/17/2016   Ferritin    Standing Status: Future     Number of Occurrences: 1     Standing Expiration Date: 04/17/2016    All questions were answered. The patient knows to call the clinic with any problems, questions or concerns. We can certainly see the patient much sooner if necessary.  This document serves as a record of services personally performed by Ancil Linsey, MD. It was created on her behalf by Arlyce Harman, a trained medical scribe. The creation of this record is based on the scribe's personal observations and the provider's statements to them. This document has been checked and approved by the attending provider.  I have reviewed the above documentation for accuracy and completeness, and I agree with the above. This note was electronically signed. Molli Hazard MD 04/18/2015

## 2015-04-18 NOTE — Progress Notes (Unsigned)
Labs drawn

## 2015-04-18 NOTE — Progress Notes (Signed)
Please see doctors encounter for more information 

## 2015-04-19 LAB — CA 125: CA 125: 950.6 U/mL — ABNORMAL HIGH (ref 0.0–38.1)

## 2015-05-01 ENCOUNTER — Other Ambulatory Visit (HOSPITAL_COMMUNITY): Payer: Self-pay | Admitting: Oncology

## 2015-05-01 DIAGNOSIS — C549 Malignant neoplasm of corpus uteri, unspecified: Secondary | ICD-10-CM

## 2015-05-03 ENCOUNTER — Encounter (INDEPENDENT_AMBULATORY_CARE_PROVIDER_SITE_OTHER): Payer: Self-pay | Admitting: *Deleted

## 2015-05-08 ENCOUNTER — Other Ambulatory Visit (HOSPITAL_COMMUNITY): Payer: Self-pay | Admitting: Hematology & Oncology

## 2015-05-30 ENCOUNTER — Encounter (HOSPITAL_COMMUNITY): Payer: Commercial Managed Care - HMO | Attending: Hematology & Oncology

## 2015-05-30 DIAGNOSIS — D509 Iron deficiency anemia, unspecified: Secondary | ICD-10-CM | POA: Diagnosis present

## 2015-05-30 DIAGNOSIS — R29898 Other symptoms and signs involving the musculoskeletal system: Secondary | ICD-10-CM | POA: Diagnosis present

## 2015-05-30 DIAGNOSIS — Z452 Encounter for adjustment and management of vascular access device: Secondary | ICD-10-CM | POA: Diagnosis not present

## 2015-05-30 DIAGNOSIS — C549 Malignant neoplasm of corpus uteri, unspecified: Secondary | ICD-10-CM | POA: Diagnosis not present

## 2015-05-30 DIAGNOSIS — M542 Cervicalgia: Secondary | ICD-10-CM | POA: Insufficient documentation

## 2015-05-30 MED ORDER — SODIUM CHLORIDE 0.9 % IJ SOLN
10.0000 mL | INTRAMUSCULAR | Status: DC | PRN
  Administered 2015-05-30: 10 mL via INTRAVENOUS
  Filled 2015-05-30: qty 10

## 2015-05-30 MED ORDER — HEPARIN SOD (PORK) LOCK FLUSH 100 UNIT/ML IV SOLN
INTRAVENOUS | Status: AC
  Filled 2015-05-30: qty 5

## 2015-05-30 MED ORDER — HEPARIN SOD (PORK) LOCK FLUSH 100 UNIT/ML IV SOLN
500.0000 [IU] | Freq: Once | INTRAVENOUS | Status: AC
  Administered 2015-05-30: 500 [IU] via INTRAVENOUS

## 2015-05-30 NOTE — Progress Notes (Signed)
Kristina Rodriguez presented for Portacath access and flush. Proper placement of portacath confirmed by CXR. Portacath located right chest wall accessed with  H 20 needle. Good blood return present. Portacath flushed with 74ml NS and 500U/37ml Heparin and needle removed intact. Procedure without incident. Patient tolerated procedure well.

## 2015-05-31 LAB — CA 125: CA 125: 984.6 U/mL — ABNORMAL HIGH (ref 0.0–38.1)

## 2015-06-06 ENCOUNTER — Encounter (INDEPENDENT_AMBULATORY_CARE_PROVIDER_SITE_OTHER): Payer: Self-pay | Admitting: Internal Medicine

## 2015-06-06 ENCOUNTER — Telehealth (INDEPENDENT_AMBULATORY_CARE_PROVIDER_SITE_OTHER): Payer: Self-pay | Admitting: *Deleted

## 2015-06-06 ENCOUNTER — Ambulatory Visit (INDEPENDENT_AMBULATORY_CARE_PROVIDER_SITE_OTHER): Payer: Commercial Managed Care - HMO | Admitting: Internal Medicine

## 2015-06-06 VITALS — BP 134/52 | HR 112 | Temp 97.7°F | Ht 65.0 in | Wt 157.9 lb

## 2015-06-06 DIAGNOSIS — C549 Malignant neoplasm of corpus uteri, unspecified: Secondary | ICD-10-CM | POA: Diagnosis not present

## 2015-06-06 DIAGNOSIS — R197 Diarrhea, unspecified: Secondary | ICD-10-CM | POA: Diagnosis not present

## 2015-06-06 MED ORDER — PEG 3350-KCL-NA BICARB-NACL 420 G PO SOLR: 4000.0000 mL | Freq: Once | ORAL | Status: DC

## 2015-06-06 NOTE — Patient Instructions (Signed)
Imodium: One in am one in pm. Will schedule a colonoscopy. The risks and benefits such as perforation, bleeding, and infection were reviewed with the patient and is agreeable.

## 2015-06-06 NOTE — Telephone Encounter (Signed)
Patient needs trilyte 

## 2015-06-06 NOTE — Progress Notes (Signed)
   Subjective:    Patient ID: Kristina Rodriguez, female    DOB: October 21, 1941, 74 y.o.   MRN: [id number]  HPI Referred to our office by Dr. Willey Blade. She tells me her BMs are loose. She says her BMs move in the am but she doesn't have anymore after that. She feels like she needs to have a BM but can't. On average she has two stools a day. Symptoms x 2 years. She really tells me she has not had any change in her stools. She is requesting a colonoscopy. She thinks she has a hx of colon polyps.  If she doesn't take her pain pill, she will have diarrhea. She still has pressure like she has to have a BM. She denies seeing any melena or BRRB.  She takes Lomotil for her diarrhea which helps.  Her last radiation tx in December of 2015. Hx of carcinosarcoma of the uterus.  Her last colonoscopy in 2002 or 2003 by Dr. Laural Golden per patient. Her appetite has been good. No weight loss.  No abdominal pain.  CBC    Component Value Date/Time   WBC 11.0* 04/18/2015 1117   RBC 3.95 04/18/2015 1117   RBC 4.05 01/26/2014 0920   HGB 11.4* 04/18/2015 1117   HCT 35.2* 04/18/2015 1117   PLT 171 04/18/2015 1117   MCV 89.1 04/18/2015 1117   MCH 28.9 04/18/2015 1117   MCHC 32.4 04/18/2015 1117   RDW 15.5 04/18/2015 1117   LYMPHSABS 0.6* 04/18/2015 1117   MONOABS 0.4 04/18/2015 1117   EOSABS 0.1 04/18/2015 1117   BASOSABS 0.0 04/18/2015 1117    Iron/TIBC/Ferritin/ %Sat    Component Value Date/Time   IRON 79 08/24/2014 0833   TIBC 308 08/24/2014 0833   FERRITIN 328* 04/18/2015 1117   IRONPCTSAT 26 08/24/2014 0833        1. Stage III carcinosarcoma of the uterus, status post TAH, BSO, pelvic and para-aortic lymphadenectomy at [facility] by  DR. Nancy Marus in August of 2015. Status post 2 cycles of carboplatin/Taxol resulting in severe peripheral neuropathy, radiotherapy completed on 10/31/2014.       05/30/2015 CA126: 984.6  02/26/2915 albmin 4.1, total bili 0.3, ALP 80, AST 16, ALT 11.   Review of  Systems    Married, one adopted daughter in good health Objective:   Physical Exam Blood pressure 134/52, pulse 112, temperature 97.7 F (36.5 C), height 5\' 5"  (1.651 m), weight 157 lb 14.4 oz (71.623 kg).  Alert and oriented. Skin warm and dry. Oral mucosa is moist.   . Sclera anicteric, conjunctivae is pink. Thyroid not enlarged. No cervical lymphadenopathy. Lungs clear. Heart regular rate and rhythm.  Abdomen is soft. Bowel sounds are positive. No hepatomegaly. No abdominal masses felt. No tenderness.  No edema to lower extremities.         Assessment & Plan:  Diarrhea which responds to opiates and Lomitil. Her last colooscopy was in 2002 or 2003.  Imodium one in am and one in pm Needs screening colonoscopy

## 2015-06-12 ENCOUNTER — Other Ambulatory Visit (INDEPENDENT_AMBULATORY_CARE_PROVIDER_SITE_OTHER): Payer: Self-pay | Admitting: *Deleted

## 2015-06-12 DIAGNOSIS — Z1211 Encounter for screening for malignant neoplasm of colon: Secondary | ICD-10-CM

## 2015-06-13 ENCOUNTER — Encounter (HOSPITAL_COMMUNITY): Payer: Self-pay | Admitting: *Deleted

## 2015-06-13 ENCOUNTER — Ambulatory Visit (HOSPITAL_COMMUNITY)
Admission: RE | Admit: 2015-06-13 | Discharge: 2015-06-13 | Disposition: A | Discharge status: Home / Self Care | Payer: Commercial Managed Care - HMO | Source: Ambulatory Visit | Attending: Internal Medicine | Admitting: Internal Medicine

## 2015-06-13 ENCOUNTER — Encounter (HOSPITAL_COMMUNITY)
Admission: RE | Disposition: A | Discharge status: Home / Self Care | Payer: Self-pay | Source: Ambulatory Visit | Attending: Internal Medicine

## 2015-06-13 DIAGNOSIS — E785 Hyperlipidemia, unspecified: Secondary | ICD-10-CM | POA: Insufficient documentation

## 2015-06-13 DIAGNOSIS — R197 Diarrhea, unspecified: Secondary | ICD-10-CM | POA: Insufficient documentation

## 2015-06-13 DIAGNOSIS — Q2733 Arteriovenous malformation of digestive system vessel: Secondary | ICD-10-CM | POA: Diagnosis not present

## 2015-06-13 DIAGNOSIS — D509 Iron deficiency anemia, unspecified: Secondary | ICD-10-CM | POA: Diagnosis not present

## 2015-06-13 DIAGNOSIS — I10 Essential (primary) hypertension: Secondary | ICD-10-CM | POA: Diagnosis not present

## 2015-06-13 DIAGNOSIS — Z923 Personal history of irradiation: Secondary | ICD-10-CM | POA: Insufficient documentation

## 2015-06-13 DIAGNOSIS — Z79899 Other long term (current) drug therapy: Secondary | ICD-10-CM | POA: Insufficient documentation

## 2015-06-13 DIAGNOSIS — F329 Major depressive disorder, single episode, unspecified: Secondary | ICD-10-CM | POA: Insufficient documentation

## 2015-06-13 DIAGNOSIS — K5730 Diverticulosis of large intestine without perforation or abscess without bleeding: Secondary | ICD-10-CM

## 2015-06-13 DIAGNOSIS — E119 Type 2 diabetes mellitus without complications: Secondary | ICD-10-CM | POA: Diagnosis not present

## 2015-06-13 DIAGNOSIS — Z8542 Personal history of malignant neoplasm of other parts of uterus: Secondary | ICD-10-CM | POA: Insufficient documentation

## 2015-06-13 DIAGNOSIS — Z1211 Encounter for screening for malignant neoplasm of colon: Secondary | ICD-10-CM | POA: Diagnosis not present

## 2015-06-13 HISTORY — PX: COLONOSCOPY: SHX5424

## 2015-06-13 LAB — GLUCOSE, CAPILLARY: Glucose-Capillary: 267 mg/dL — ABNORMAL HIGH (ref 65–99)

## 2015-06-13 SURGERY — COLONOSCOPY
Anesthesia: Moderate Sedation

## 2015-06-13 MED ORDER — SIMETHICONE 40 MG/0.6ML PO SUSP
ORAL | Status: DC | PRN
  Administered 2015-06-13: 11:00:00

## 2015-06-13 MED ORDER — MIDAZOLAM HCL 5 MG/5ML IJ SOLN
INTRAMUSCULAR | Status: DC | PRN
Start: 2015-06-13 — End: 2015-06-13
  Administered 2015-06-13 (×3): 2 mg via INTRAVENOUS

## 2015-06-13 MED ORDER — MEPERIDINE HCL 50 MG/ML IJ SOLN
INTRAMUSCULAR | Status: DC | PRN
  Administered 2015-06-13 (×2): 25 mg via INTRAVENOUS

## 2015-06-13 MED ORDER — SODIUM CHLORIDE 0.9 % IV SOLN
INTRAVENOUS | Status: DC
  Administered 2015-06-13: 11:00:00 via INTRAVENOUS

## 2015-06-13 MED ORDER — MIDAZOLAM HCL 5 MG/5ML IJ SOLN
INTRAMUSCULAR | Status: AC
  Filled 2015-06-13: qty 10

## 2015-06-13 MED ORDER — HYOSCYAMINE SULFATE 0.125 MG SL SUBL: 0.1250 mg | SUBLINGUAL_TABLET | Freq: Three times a day (TID) | SUBLINGUAL | Status: DC | PRN

## 2015-06-13 MED ORDER — MEPERIDINE HCL 50 MG/ML IJ SOLN
INTRAMUSCULAR | Status: DC
Start: 2015-06-13 — End: 2015-06-13
  Filled 2015-06-13: qty 1

## 2015-06-13 MED ORDER — BENEFIBER DRINK MIX PO PACK: 4.0000 g | PACK | Freq: Every day | ORAL | Status: DC

## 2015-06-13 NOTE — H&P (Signed)
Kristina Rodriguez is an 74 y.o. female.   Chief Complaint: Patient is here for colonoscopy. HPI: Patient is 74 year old Caucasian female who is here primarily for screening colonoscopy. Her last exam was in January 2003 and was normal. She states she had surgery for uterine sarcoma in August last year followed by radiation therapy which was completed in December 2015. Ever since she is on diarrhea usually 3-4 stools every morning. Her stools are normal with anti-diarrheal and oral hydrocodone. She denies rectal bleeding or melena. She also denies anorexia weight loss.  Past Medical History  Diagnosis Date  . Diabetes mellitus, type II   . Hypertension   . Hyperlipidemia   . Osteoarthritis   . Iron deficiency anemia     attributed to long-term treatment with a PPI  . Anxiety and depression   . Hepatic steatosis 2006    mild  . Cholelithiasis 2006    Acute and chronic cholecystitis; laparoscopic cholecystectomy in 2006  . 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 Chronic pain   . Collagen vascular disease   . Anxiety   . Uterine cancer 7/15    carcinosarcoma    Past Surgical History  Procedure Laterality Date  . Tonsillectomy    . Carpal tunnel release  1994    Right  . Cholecystectomy, laparoscopic  2006    Dr. Romona Curls; cholelithiasis  . Colonoscopy  2003    Najeeb Rehman;iron deficiency anemia; normal study; hiatal hernia, gastritis, Schatzki's ring on EGD  . Breast biopsy  10/2012    Left; fibroadenoma  . Cholecystectomy    . Tubal ligation  1970s    removed  . Hysteroscopy w/d&c N/A 06/06/2014    Procedure: DILATATION AND CURETTAGE /HYSTEROSCOPY;  Surgeon: Jonnie Kind, MD;  Location: [facility];  Service: Gynecology;  Laterality: N/A;  . Polypectomy N/A 06/06/2014    Procedure: POLYPECTOMY;  Surgeon: Jonnie Kind, MD;  Location: [facility];  Service: Gynecology;  Laterality: N/A;  . Abdominal hysterectomy  06/16/14    robotic hysterectomy, BSO,  pelvic and para-aortic lymphadenectomy for uterine carcinosarcoma    Family History  Problem Relation Age of Onset  . Cancer Mother 25    pancreas and liver  . Diabetes Brother   . Congestive Heart Failure Father   . Hemochromatosis Brother   . Cancer Maternal Aunt   . Cancer Maternal Uncle    Social History:  reports that she has never smoked. She has never used smokeless tobacco. She reports that she does not drink alcohol or use illicit drugs.  Allergies:  Allergies  Allergen Reactions  . Codeine Nausea Only    Medications Prior to Admission  Medication Sig Dispense Refill  . ALPRAZolam (XANAX) 1 MG tablet Take 1 mg by mouth 3 (three) times daily as needed for anxiety.     Marland Kitchen atorvastatin (LIPITOR) 40 MG tablet Take 40 mg by mouth daily.     . furosemide (LASIX) 40 MG tablet Take one tablet daily in the morning or as directed for fluid retention. 30 tablet 6  . gabapentin (NEURONTIN) 600 MG tablet Take 600 mg by mouth 3 (three) times daily.     Marland Kitchen glipiZIDE (GLUCOTROL) 5 MG tablet Take 5 mg by mouth 2 (two) times daily before a meal.     . HYDROcodone-acetaminophen (NORCO/VICODIN) 5-325 MG per tablet Take 1 tablet by mouth every 6 (six) hours as needed for moderate pain.    Marland Kitchen lidocaine-prilocaine (  EMLA) cream Apply 1 application topically as needed (when port is accessed.).     Marland Kitchen losartan (COZAAR) 100 MG tablet Take 100 mg by mouth every morning.     . metFORMIN (GLUCOPHAGE) 1000 MG tablet Take 1,000 mg by mouth 2 (two) times daily with a meal.    . omeprazole (PRILOSEC) 40 MG capsule Take 40 mg by mouth daily.    . pioglitazone (ACTOS) 15 MG tablet Take 15 mg by mouth daily.    . polyethylene glycol-electrolytes (NULYTELY/GOLYTELY) 420 G solution Take 4,000 mLs by mouth once. 4000 mL 0  . potassium chloride SA (K-DUR,KLOR-CON) 20 MEQ tablet Take 1 tablet (20 mEq total) by mouth 2 (two) times daily. (Patient taking differently: Take 20 mEq by mouth daily as needed (when taking  fluid pill). ) 60 tablet 3  . Menthol-Methyl Salicylate (MUSCLE RUB) 10-15 % CREA Apply 1 application topically as needed for muscle pain (apply to hip).      No results found for this or any previous visit (from the past 48 hour(s)). No results found.  ROS  Blood pressure 142/73, pulse 109, temperature 98.4 F (36.9 C), temperature source Oral, resp. rate 12, height 5\' 5"  (1.651 m), weight 157 lb (71.215 kg), SpO2 100 %. Physical Exam  Constitutional: She appears well-developed and well-nourished.  HENT:  Mouth/Throat: Oropharynx is clear and moist.  Eyes: Conjunctivae are normal. No scleral icterus.  Neck: No tracheal deviation present. No thyromegaly present.  Cardiovascular: Normal rate, regular rhythm and normal heart sounds.   No murmur heard. Respiratory: Effort normal and breath sounds normal.  GI:  Lower midline scar. Soft abdomen mild tenderness at LLQ in hypogastric region. No organomegaly or masses.  Musculoskeletal: She exhibits no edema.  Lymphadenopathy:    She has no cervical adenopathy.  Neurological: She is alert.  Skin: Skin is warm and dry.     Assessment/Plan Average risk screening colonoscopy. Diarrhea presumed to be due to radiation therapy to pelvic region.  REHMAN,NAJEEB U 06/13/2015, 10:55 AM

## 2015-06-13 NOTE — Discharge Instructions (Signed)
Resume usual medications and high fiber diet. Hyoscyamine sublingual 1 tablet 3 times a day as needed. Benefiber 4 g by mouth daily at bedtime. No driving for 24 hours.   Colonoscopy, Care After These instructions give you information on caring for yourself after your procedure. Your doctor may also give you more specific instructions. Call your doctor if you have any problems or questions after your procedure. HOME CARE  Do not drive for 24 hours.  Do not sign important papers or use machinery for 24 hours.  You may shower.  You may go back to your usual activities, but go slower for the first 24 hours.  Take rest breaks often during the first 24 hours.  Walk around or use warm packs on your belly (abdomen) if you have belly cramping or gas.  Drink enough fluids to keep your pee (urine) clear or pale yellow.  Resume your normal diet. Avoid heavy or fried foods.  Avoid drinking alcohol for 24 hours or as told by your doctor.  Only take medicines as told by your doctor. If a tissue sample (biopsy) was taken during the procedure:   Do not take aspirin or blood thinners for 7 days, or as told by your doctor.  Do not drink alcohol for 7 days, or as told by your doctor.  Eat soft foods for the first 24 hours. GET HELP IF: You still have a small amount of blood in your poop (stool) 2-3 days after the procedure. GET HELP RIGHT AWAY IF:  You have more than a small amount of blood in your poop.  You see clumps of tissue (blood clots) in your poop.  Your belly is puffy (swollen).  You feel sick to your stomach (nauseous) or throw up (vomit).  You have a fever.  You have belly pain that gets worse and medicine does not help. MAKE SURE YOU:  Understand these instructions.  Will watch your condition.  Will get help right away if you are not doing well or get worse. Document Released: 11/29/2010 Document Revised: 11/01/2013 Document Reviewed: 07/04/2013 [facility] Patient  Information 2015 [place], [place]. This information is not intended to replace advice given to you by your health care provider. Make sure you discuss any questions you have with your health care provider.   High-Fiber Diet Fiber is found in fruits, vegetables, and grains. A high-fiber diet encourages the addition of more whole grains, legumes, fruits, and vegetables in your diet. The recommended amount of fiber for adult males is 38 g per day. For adult females, it is 25 g per day. Pregnant and lactating women should get 28 g of fiber per day. If you have a digestive or bowel problem, ask your caregiver for advice before adding high-fiber foods to your diet. Eat a variety of high-fiber foods instead of only a select few type of foods.  PURPOSE  To increase stool bulk.  To make bowel movements more regular to prevent constipation.  To lower cholesterol.  To prevent overeating. WHEN IS THIS DIET USED?  It may be used if you have constipation and hemorrhoids.  It may be used if you have uncomplicated diverticulosis (intestine condition) and irritable bowel syndrome.  It may be used if you need help with weight management.  It may be used if you want to add it to your diet as a protective measure against atherosclerosis, diabetes, and cancer. SOURCES OF FIBER  Whole-grain breads and cereals.  Fruits, such as apples, oranges, bananas, berries, prunes, and  pears.  Vegetables, such as green peas, carrots, sweet potatoes, beets, broccoli, cabbage, spinach, and artichokes.  Legumes, such split peas, soy, lentils.  Almonds. FIBER CONTENT IN FOODS Starches and Grains / Dietary Fiber (g)  Cheerios, 1 cup / 3 g  Corn Flakes cereal, 1 cup / 0.7 g  Rice crispy treat cereal, 1 cup / 0.3 g  Instant oatmeal (cooked),  cup / 2 g  Frosted wheat cereal, 1 cup / 5.1 g  Brown, long-grain rice (cooked), 1 cup / 3.5 g  White, long-grain rice (cooked), 1 cup / 0.6 g  Enriched macaroni  (cooked), 1 cup / 2.5 g Legumes / Dietary Fiber (g)  Baked beans (canned, plain, or vegetarian),  cup / 5.2 g  Kidney beans (canned),  cup / 6.8 g  Pinto beans (cooked),  cup / 5.5 g Breads and Crackers / Dietary Fiber (g)  Plain or honey graham crackers, 2 squares / 0.7 g  Saltine crackers, 3 squares / 0.3 g  Plain, salted pretzels, 10 pieces / 1.8 g  Whole-wheat bread, 1 slice / 1.9 g  White bread, 1 slice / 0.7 g  Raisin bread, 1 slice / 1.2 g  Plain bagel, 3 oz / 2 g  Flour tortilla, 1 oz / 0.9 g  Corn tortilla, 1 small / 1.5 g  Hamburger or hotdog bun, 1 small / 0.9 g Fruits / Dietary Fiber (g)  Apple with skin, 1 medium / 4.4 g  Sweetened applesauce,  cup / 1.5 g  Banana,  medium / 1.5 g  Grapes, 10 grapes / 0.4 g  Orange, 1 small / 2.3 g  Raisin, 1.5 oz / 1.6 g  Melon, 1 cup / 1.4 g Vegetables / Dietary Fiber (g)  Green beans (canned),  cup / 1.3 g  Carrots (cooked),  cup / 2.3 g  Broccoli (cooked),  cup / 2.8 g  Peas (cooked),  cup / 4.4 g  Mashed potatoes,  cup / 1.6 g  Lettuce, 1 cup / 0.5 g  Corn (canned),  cup / 1.6 g  Tomato,  cup / 1.1 g Document Released: 10/27/2005 Document Revised: 04/27/2012 Document Reviewed: 01/29/2012 [employer] Patient Information 2015 [place], [place]. This information is not intended to replace advice given to you by your health care provider. Make sure you discuss any questions you have with your health care provider.

## 2015-06-13 NOTE — Op Note (Signed)
COLONOSCOPY PROCEDURE REPORT  PATIENT:  Kristina Rodriguez  MR#:  [id number] Birthdate:  21-Jan-1941, 74 y.o., female Endoscopist:  Dr. Rogene Houston, MD Referred By:  Dr. [name], MD  Procedure Date: 06/13/2015  Procedure:   Colonoscopy  Indications:  Patient is 74 year old Caucasian female was undergoing colonoscopy primarily for screening purposes. Her last exam was in January 2003. 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 She remains in remission. She has had intermittent diarrhea since receiving radiation therapy.  Informed Consent:  The procedure and risks were reviewed with the patient and informed consent was obtained.  Medications:  Demerol 50 mg IV Versed 6 mg IV  Description of procedure:  After a digital rectal exam was performed, that colonoscope was advanced from the anus through the rectum and colon to the area of the cecum, ileocecal valve and appendiceal orifice. The cecum was deeply intubated. These structures were well-seen and photographed for the record. From the level of the cecum and ileocecal valve, the scope was slowly and cautiously withdrawn. The mucosal surfaces were carefully surveyed utilizing scope tip to flexion to facilitate fold flattening as needed. The scope was pulled down into the rectum where a thorough exam including retroflexion was performed.  Findings:   Prep satisfactory. Small cecal AV malformation noted. Few diverticula at sigmoid colon. Normal rectal mucosa and anorectal junction.   Therapeutic/Diagnostic Maneuvers Performed:   None  Complications:  None  Cecal Withdrawal Time:  11 minutes  Impression:  Examination performed to cecum. No evidence of endoscopic colitis. Small cecal AV malformation. Mild sigmoid colon diverticulosis.  Comment; Suspect diarrhea secondary to IBS or secondary to her medications.  Recommendations:  Standard instructions  given. High fiber diet. Levsin sublingual 1 tablet 3 times a day when necessary. Benefiber 4 g by mouth daily at bedtime   Karima Carrell U  06/13/2015 11:38 AM  CC: Dr. [name], MD & Dr. Rayne Du ref. provider found

## 2015-06-15 ENCOUNTER — Encounter (INDEPENDENT_AMBULATORY_CARE_PROVIDER_SITE_OTHER): Payer: Self-pay

## 2015-07-11 ENCOUNTER — Encounter (HOSPITAL_COMMUNITY): Payer: Commercial Managed Care - HMO | Attending: Oncology

## 2015-07-11 ENCOUNTER — Encounter (HOSPITAL_COMMUNITY): Payer: Self-pay | Admitting: Internal Medicine

## 2015-07-11 VITALS — BP 127/62 | HR 97 | Resp 18

## 2015-07-11 DIAGNOSIS — D509 Iron deficiency anemia, unspecified: Secondary | ICD-10-CM | POA: Insufficient documentation

## 2015-07-11 DIAGNOSIS — C549 Malignant neoplasm of corpus uteri, unspecified: Secondary | ICD-10-CM | POA: Diagnosis not present

## 2015-07-11 DIAGNOSIS — M542 Cervicalgia: Secondary | ICD-10-CM | POA: Insufficient documentation

## 2015-07-11 DIAGNOSIS — Z452 Encounter for adjustment and management of vascular access device: Secondary | ICD-10-CM

## 2015-07-11 DIAGNOSIS — Z95828 Presence of other vascular implants and grafts: Secondary | ICD-10-CM

## 2015-07-11 DIAGNOSIS — R29898 Other symptoms and signs involving the musculoskeletal system: Secondary | ICD-10-CM | POA: Insufficient documentation

## 2015-07-11 MED ORDER — HEPARIN SOD (PORK) LOCK FLUSH 100 UNIT/ML IV SOLN
INTRAVENOUS | Status: AC
  Filled 2015-07-11: qty 5

## 2015-07-11 MED ORDER — HEPARIN SOD (PORK) LOCK FLUSH 100 UNIT/ML IV SOLN
500.0000 [IU] | Freq: Once | INTRAVENOUS | Status: AC
  Administered 2015-07-11: 500 [IU] via INTRAVENOUS

## 2015-07-11 MED ORDER — SODIUM CHLORIDE 0.9 % IJ SOLN
10.0000 mL | INTRAMUSCULAR | Status: DC | PRN
  Administered 2015-07-11: 10 mL via INTRAVENOUS
  Filled 2015-07-11: qty 10

## 2015-07-11 NOTE — Progress Notes (Signed)
Matasha M Jarnigan presented for Portacath access and flush.  Proper placement of portacath confirmed by CXR.  Portacath located right chest wall accessed with  H 20 needle.  Good blood return present. Portacath flushed with 20ml NS and 500U/5ml Heparin and needle removed intact.  Procedure tolerated well and without incident.    

## 2015-07-11 NOTE — Patient Instructions (Signed)
[place] at [facility] Discharge Instructions  RECOMMENDATIONS MADE BY THE CONSULTANT AND ANY TEST RESULTS WILL BE SENT TO YOUR REFERRING PHYSICIAN.  =port flush today Follow up as scheduled  Thank you for choosing [place] at [facility] to provide your oncology and hematology care.  To afford each patient quality time with our provider, please arrive at least 15 minutes before your scheduled appointment time.    You need to re-schedule your appointment should you arrive 10 or more minutes late.  We strive to give you quality time with our providers, and arriving late affects you and other patients whose appointments are after yours.  Also, if you no show three or more times for appointments you may be dismissed from the clinic at the providers discretion.     Again, thank you for choosing [facility].  Our hope is that these requests will decrease the amount of time that you wait before being seen by our physicians.       _____________________________________________________________  Should you have questions after your visit to [facility], please contact our office at [phone] between the hours of 8:30 a.m. and 4:30 p.m.  Voicemails left after 4:30 p.m. will not be returned until the following business day.  For prescription refill requests, have your pharmacy contact our office.

## 2015-07-19 ENCOUNTER — Other Ambulatory Visit (HOSPITAL_COMMUNITY): Payer: Self-pay | Admitting: Internal Medicine

## 2015-07-19 DIAGNOSIS — Z1231 Encounter for screening mammogram for malignant neoplasm of breast: Secondary | ICD-10-CM

## 2015-07-25 ENCOUNTER — Ambulatory Visit (HOSPITAL_COMMUNITY)
Admission: RE | Admit: 2015-07-25 | Discharge: 2015-07-25 | Disposition: A | Discharge status: Home / Self Care | Payer: Commercial Managed Care - HMO | Source: Ambulatory Visit | Attending: Internal Medicine | Admitting: Internal Medicine

## 2015-07-25 DIAGNOSIS — Z1231 Encounter for screening mammogram for malignant neoplasm of breast: Secondary | ICD-10-CM | POA: Diagnosis present

## 2015-08-08 ENCOUNTER — Encounter (HOSPITAL_COMMUNITY): Payer: Commercial Managed Care - HMO | Attending: Oncology | Admitting: Hematology & Oncology

## 2015-08-08 ENCOUNTER — Encounter (HOSPITAL_COMMUNITY): Payer: Self-pay | Admitting: Hematology & Oncology

## 2015-08-08 ENCOUNTER — Encounter (HOSPITAL_COMMUNITY): Payer: Commercial Managed Care - HMO

## 2015-08-08 VITALS — BP 148/55 | HR 98 | Temp 98.0°F | Resp 16 | Wt 164.7 lb

## 2015-08-08 DIAGNOSIS — T451X5A Adverse effect of antineoplastic and immunosuppressive drugs, initial encounter: Secondary | ICD-10-CM

## 2015-08-08 DIAGNOSIS — D6481 Anemia due to antineoplastic chemotherapy: Secondary | ICD-10-CM

## 2015-08-08 DIAGNOSIS — Z95828 Presence of other vascular implants and grafts: Secondary | ICD-10-CM

## 2015-08-08 DIAGNOSIS — M542 Cervicalgia: Secondary | ICD-10-CM | POA: Insufficient documentation

## 2015-08-08 DIAGNOSIS — D509 Iron deficiency anemia, unspecified: Secondary | ICD-10-CM | POA: Diagnosis not present

## 2015-08-08 DIAGNOSIS — R29898 Other symptoms and signs involving the musculoskeletal system: Secondary | ICD-10-CM | POA: Insufficient documentation

## 2015-08-08 DIAGNOSIS — C549 Malignant neoplasm of corpus uteri, unspecified: Secondary | ICD-10-CM | POA: Diagnosis not present

## 2015-08-08 MED ORDER — SODIUM CHLORIDE 0.9 % IJ SOLN: 10.0000 mL | INTRAMUSCULAR | Status: DC | PRN

## 2015-08-08 MED ORDER — HEPARIN SOD (PORK) LOCK FLUSH 100 UNIT/ML IV SOLN
500.0000 [IU] | Freq: Once | INTRAVENOUS | Status: DC
Start: 2015-08-08 — End: 2015-08-11

## 2015-08-08 NOTE — Patient Instructions (Signed)
[place] at [facility] Discharge Instructions  RECOMMENDATIONS MADE BY THE CONSULTANT AND ANY TEST RESULTS WILL BE SENT TO YOUR REFERRING PHYSICIAN.  Exam completed by dr Whitney Muse today Lab work with next port flush return to see the doctor in 3 months Port flushes every 8 weeks Please call the clinic if you have any questions or concerns  Thank you for choosing [place] at [facility] to provide your oncology and hematology care.  To afford each patient quality time with our provider, please arrive at least 15 minutes before your scheduled appointment time.    You need to re-schedule your appointment should you arrive 10 or more minutes late.  We strive to give you quality time with our providers, and arriving late affects you and other patients whose appointments are after yours.  Also, if you no show three or more times for appointments you may be dismissed from the clinic at the providers discretion.     Again, thank you for choosing [facility].  Our hope is that these requests will decrease the amount of time that you wait before being seen by our physicians.       _____________________________________________________________  Should you have questions after your visit to [facility], please contact our office at [phone] between the hours of 8:30 a.m. and 4:30 p.m.  Voicemails left after 4:30 p.m. will not be returned until the following business day.  For prescription refill requests, have your pharmacy contact our office.

## 2015-08-08 NOTE — Progress Notes (Signed)
See office visit encounter. 

## 2015-08-08 NOTE — Progress Notes (Signed)
Kristina Rodriguez, Kristina Rodriguez   DIAGNOSIS: Uterine endometrial cancer, sarcoma   Staging form: Corpus Uteri - Carcinoma, AJCC 7th Edition     Pathologic: Stage IIIA (T3a, N0, M0) - Signed by Everitt Amber, MD on 06/29/2014  1. Stage III carcinosarcoma of the uterus, status post TAH, BSO, pelvic and para-aortic lymphadenectomy. Status post 2 cycles of carboplatin/Taxol resulting in severe peripheral neuropathy,  radiotherapy completed on 10/31/2014. 2. Iron deficiency anemia responsive to IV Feraheme   SUMMARY OF ONCOLOGIC HISTORY: Oncology History   74 year old woman with stage IIIA carcinosarcoma of the uterus (with microscopic ovarian involvement on final pathology, negative cervix, negative pelvic and para-aortic nodes).     Uterine endometrial cancer, sarcoma   06/12/2014 Initial Diagnosis Uterine endometrial cancer, sarcoma   06/16/2014 Definitive Surgery robotic hysterectomy, BSO, pelvic and para-aortic lymphadenectomy   07/13/2014 - 08/11/2014 Chemotherapy Paclitaxel and Carboplatin   08/24/2014 Adverse Reaction Significant peripheral neuropathy.  Chemotherapy held and cancelled.  She will move on to radiation therapy, sooner than planned.   09/20/2014 - 10/31/2014 Radiation Therapy 4500 cGy in 25 fractions to pelvis.  Proximal vagina/parametrial area boosted to cumulative dose of 5040 cGy.  Treated by Dr. Sondra Come.    CURRENT THERAPY: Observation  INTERVAL HISTORY: Kristina Rodriguez 74 y.o. female returns for follow-up of his stage IIIa carcinosarcoma of the uterus. She received 2 cycles of carboplatin and Taxol but no additional therapy secondary to severe peripheral neuropathy. The patient reports she still has symptoms of neuropathy with numbness in her fingers and toes and balls of her feet. She reports she has almost fallen on several occasions because she feels unsteady.  Kristina Rodriguez is here today with her husband. She says she's doing good, and "your  scale says I'm heavier."   When asked if anything new or different is going on, she says no.  She's had her colonoscopy and mammogram. She confirms that she's been "pretty good" this year, with regards to getting her mammogram and colonoscopy done.  She admits to a little bit of pain when her abdomen is palpated during the physical exam.  She also admits that she "did fall Saturday." She says her foot hit the cane, and "she went down."  She denies any new pain, no change in her bowels, no abdominal distension or appetite change.  MEDICAL HISTORY: Past Medical History  Diagnosis Date  . Diabetes mellitus, type II   . Hypertension   . Hyperlipidemia   . Osteoarthritis   . Iron deficiency anemia     attributed to long-term treatment with a PPI  . Anxiety and depression   . Hepatic steatosis 2006    mild  . Cholelithiasis 2006    Acute and chronic cholecystitis; laparoscopic cholecystectomy in 2006  . 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 Chronic pain   . Collagen vascular disease   . Anxiety   . Uterine cancer 7/15    carcinosarcoma    has Diabetes mellitus, type II; Hypertension; Hyperlipidemia; Osteoarthritis; Iron deficiency anemia; Other chronic pain; Depressive disorder, not elsewhere classified; Screening for malignant neoplasm of the cervix; Uterine mass; Uterine endometrial cancer, sarcoma; and Hypomagnesemia on her problem list.     is allergic to codeine.  [name] does not currently have medications on file.  SURGICAL HISTORY: Past Surgical History  Procedure Laterality Date  . Tonsillectomy    . Carpal tunnel release  1994    Right  . Cholecystectomy, laparoscopic  2006    Dr. Romona Curls; cholelithiasis  . Colonoscopy  2003    Najeeb Rehman;iron deficiency anemia; normal study; hiatal hernia, gastritis, Schatzki's ring on EGD  . Breast biopsy  10/2012    Left; fibroadenoma  . Cholecystectomy    . Tubal ligation  1970s     removed  . Hysteroscopy w/d&c N/A 06/06/2014    Procedure: DILATATION AND CURETTAGE /HYSTEROSCOPY;  Surgeon: Jonnie Kind, MD;  Location: [facility];  Service: Gynecology;  Laterality: N/A;  . Polypectomy N/A 06/06/2014    Procedure: POLYPECTOMY;  Surgeon: Jonnie Kind, MD;  Location: [facility];  Service: Gynecology;  Laterality: N/A;  . Abdominal hysterectomy  06/16/14    robotic hysterectomy, BSO, pelvic and para-aortic lymphadenectomy for uterine carcinosarcoma  . Colonoscopy N/A 06/13/2015    Procedure: COLONOSCOPY;  Surgeon: Rogene Houston, MD;  Location: [facility];  Service: Endoscopy;  Laterality: N/A;  145    SOCIAL HISTORY: Social History   Social History  . Marital Status: Married    Spouse Name: N/A  . Number of Children: N/A  . Years of Education: N/A   Occupational History  . Not on file.   Social History Main Topics  . Smoking status: Never Smoker   . Smokeless tobacco: Never Used  . Alcohol Use: No     Comment: Minimal use in the past; none in recent years  . Drug Use: No  . Sexual Activity: Yes    Birth Control/ Protection: Post-menopausal   Other Topics Concern  . Not on file   Social History Narrative    FAMILY HISTORY: Family History  Problem Relation Age of Onset  . Cancer Mother 93    pancreas and liver  . Diabetes Brother   . Congestive Heart Failure Father   . Hemochromatosis Brother   . Cancer Maternal Aunt   . Cancer Maternal Uncle     Review of Systems  Constitutional: Negative.   HENT: Negative.   Eyes: Negative.   Respiratory: Negative.   Cardiovascular: Negative.   Gastrointestinal: Negative.   Genitourinary: Negative. Musculoskeletal: Positive for back pain, joint pain and falls.       Fell on Saturday because she caught her foot on her cane. Skin: Negative.   Neurological: Negative. Endo/Heme/Allergies: Negative.   Psychiatric/Behavioral: Negative.   14 point review of systems was performed and is negative except as detailed  under history of present illness and above   PHYSICAL EXAMINATION  ECOG PERFORMANCE STATUS: 1 - Symptomatic but completely ambulatory  Filed Vitals:   08/08/15 1100  BP: 148/55  Pulse: 98  Temp: 98 F (36.7 C)  Resp: 16    Physical Exam  Constitutional: She is oriented to person, place, and time and well-developed, well-nourished, and in no distress.   Uses cane; able to ambulate to the exam table without it. Minor difficulty standing. HENT:  Head: Normocephalic and atraumatic.  Nose: Nose normal.  Mouth/Throat: Oropharynx is clear and moist. No oropharyngeal exudate.  Eyes: Conjunctivae and EOM are normal. Pupils are equal, round, and reactive to light. Right eye exhibits no discharge. Left eye exhibits no discharge. No scleral icterus.  Neck: Normal range of motion. Neck supple. No tracheal deviation present. No thyromegaly present.  Cardiovascular: Normal rate, regular rhythm and normal heart sounds.  Exam reveals no gallop and no friction rub.   No murmur heard. Pulmonary/Chest: Effort normal and breath sounds normal. She has no  wheezes. She has no rales.  Abdominal: Soft. Bowel sounds are normal. She exhibits no distension and no mass. There is tenderness. There is no rebound.  Musculoskeletal: Normal range of motion. She exhibits no edema.  Lymphadenopathy:    She has no cervical adenopathy.  Neurological: She is alert and oriented to person, place, and time. She has normal reflexes. No cranial nerve deficit. Gait normal. Coordination normal.  Skin: Skin is warm and dry. No rash noted.  Psychiatric: Mood, memory, affect and judgment normal.  Nursing note and vitals reviewed.  RADIOLOGY: I have reviewed the images detailed below:  CLINICAL DATA: Followup endometrial carcinosarcoma status post chemotherapy and radiation therapy. Diarrhea.  EXAM: CT ABDOMEN AND PELVIS WITH CONTRAST  TECHNIQUE: Multidetector CT imaging of the abdomen and pelvis was performed using  the standard protocol following bolus administration of intravenous contrast.  CONTRAST: 157mL OMNIPAQUE IOHEXOL 300 MG/ML SOLN  COMPARISON: 05/09/2014  FINDINGS: Lower Chest: Patchy airspace disease is seen in the inferior aspect of the right middle lobe which is new since previous study, and suspicious for infectious or inflammatory process. No discrete nodules or masses are seen in visualized portions of lung bases.  Hepatobiliary: Prior cholecystectomy noted. No evidence of biliary dilatation. Small cyst seen adjacent to the gallbladder fossa remains stable. A sub-cm low-attenuation lesion in the lateral segment left hepatic lobe on image 18/series 2 remains stable. This is too small to characterize but most likely represents a tiny benign hemangioma or cyst. No new or enlarging liver lesions identified.  Pancreas: No mass, inflammatory changes, or other significant abnormality identified.  Spleen: Within normal limits in size and appearance.  Adrenals: No masses identified.  Kidneys/Urinary Tract: No evidence of masses or hydronephrosis. Stable small benign-appearing right renal cysts again noted.  Stomach/Bowel/Peritoneum: No evidence of wall thickening, mass, or obstruction. Mild sigmoid diverticulosis noted, without evidence diverticulitis.  Vascular/Lymphatic: No pathologically enlarged lymph nodes identified. No other significant abnormality visualized.  Reproductive: Patient has undergone hysterectomy since previous study. A fluid collection with thin enhancing rim is seen along the left pelvic sidewall and iliac vessels which measures 6.5 x 9.2 cm. This is consistent with a postop lymphocele. No other adnexal masses are identified.  Other: None.  Musculoskeletal: No suspicious bone lesions identified.  IMPRESSION: Interval hysterectomy. 9 cm fluid collection along the left pelvic sidewall and iliac vessels, consistent with postop  lymphocele.  No evidence of residual or metastatic carcinoma within the pelvis or abdomen.  New patchy airspace disease seen in right middle lobe, suspicious for infectious or inflammatory process. Recommend clinical correlation and chest radiograph for further evaluation.   Electronically Signed  By: Earle Gell M.D.  On: 03/16/2015 11:51   LABORATORY DATA: I have reviewed the data as listed.  CBC    Component Value Date/Time   WBC 11.0* 04/18/2015 1117   RBC 3.95 04/18/2015 1117   RBC 4.05 01/26/2014 0920   HGB 11.4* 04/18/2015 1117   HCT 35.2* 04/18/2015 1117   PLT 171 04/18/2015 1117   MCV 89.1 04/18/2015 1117   MCH 28.9 04/18/2015 1117   MCHC 32.4 04/18/2015 1117   RDW 15.5 04/18/2015 1117   LYMPHSABS 0.6* 04/18/2015 1117   MONOABS 0.4 04/18/2015 1117   EOSABS 0.1 04/18/2015 1117   BASOSABS 0.0 04/18/2015 1117   CMP     Component Value Date/Time   NA 131* 04/18/2015 1117   K 4.4 04/18/2015 1117   CL 89* 04/18/2015 1117   CO2 29 04/18/2015 1117  GLUCOSE 369* 04/18/2015 1117   BUN 28* 04/18/2015 1117   CREATININE 0.99 04/18/2015 1117   CALCIUM 8.4* 04/18/2015 1117   PROT 6.6 04/18/2015 1117   ALBUMIN 3.3* 04/18/2015 1117   AST 12* 04/18/2015 1117   ALT 15 04/18/2015 1117   ALKPHOS 97 04/18/2015 1117   BILITOT 0.8 04/18/2015 1117   GFRNONAA 55* 04/18/2015 1117   GFRAA >60 04/18/2015 1117     ASSESSMENT and THERAPY PLAN:  Stage III carcinosarcoma of the uterus, status post TAH, BSO, pelvic and para-aortic lymphadenectomy. Status post 2 cycles of carboplatin/Taxol resulting in severe peripheral neuropathy,  radiotherapy completed on 10/31/2014. Iron deficiency anemia responsive to IV Feraheme   She is doing well without obvious evidence of recurrence. She is up to date on mammography, colonoscopy and last CT imaging was in May with Dr. Denman George.   I will order bloodwork for Ms. Spellman today. We will continue to follow iron studies.  We will  follow-up with her in 3 months.  Orders Placed This Encounter  Procedures  . CBC with Differential    Standing Status: Future     Number of Occurrences:      Standing Expiration Date: 08/07/2016  . Comprehensive metabolic panel    Standing Status: Future     Number of Occurrences:      Standing Expiration Date: 08/07/2016  . CA 125    Standing Status: Future     Number of Occurrences:      Standing Expiration Date: 08/07/2016  . Ferritin    Standing Status: Future     Number of Occurrences:      Standing Expiration Date: 08/07/2016  . CA 125    Standing Status: Future     Number of Occurrences:      Standing Expiration Date: 08/07/2016  . CBC with Differential    Standing Status: Future     Number of Occurrences:      Standing Expiration Date: 08/07/2016  . Comprehensive metabolic panel    Standing Status: Future     Number of Occurrences:      Standing Expiration Date: 08/07/2016  . Ferritin    Standing Status: Future     Number of Occurrences:      Standing Expiration Date: 08/07/2016  . Schedule Portacath Flush Appointment    Schedule Portacath flush appointment    All questions were answered. The patient knows to call the clinic with any problems, questions or concerns. We can certainly see the patient much sooner if necessary.  This document serves as a record of services personally performed by Ancil Linsey, MD. It was created on her behalf by Toni Amend, a trained medical scribe. The creation of this record is based on the scribe's personal observations and the provider's statements to them. This document has been checked and approved by the attending provider.  I have reviewed the above documentation for accuracy and completeness, and I agree with the above.  Molli Hazard, MD  08/10/2015

## 2015-08-08 NOTE — Progress Notes (Signed)
Pt wanted to wait 4 weeks have port flushed

## 2015-08-09 ENCOUNTER — Encounter (HOSPITAL_COMMUNITY): Payer: Self-pay | Admitting: Hematology & Oncology

## 2015-09-05 ENCOUNTER — Encounter (HOSPITAL_COMMUNITY): Payer: Commercial Managed Care - HMO | Attending: Oncology

## 2015-09-05 ENCOUNTER — Encounter (HOSPITAL_COMMUNITY): Payer: Self-pay

## 2015-09-05 DIAGNOSIS — R29898 Other symptoms and signs involving the musculoskeletal system: Secondary | ICD-10-CM | POA: Insufficient documentation

## 2015-09-05 DIAGNOSIS — Z452 Encounter for adjustment and management of vascular access device: Secondary | ICD-10-CM | POA: Diagnosis not present

## 2015-09-05 DIAGNOSIS — M542 Cervicalgia: Secondary | ICD-10-CM | POA: Diagnosis present

## 2015-09-05 DIAGNOSIS — D509 Iron deficiency anemia, unspecified: Secondary | ICD-10-CM | POA: Diagnosis present

## 2015-09-05 DIAGNOSIS — C549 Malignant neoplasm of corpus uteri, unspecified: Secondary | ICD-10-CM | POA: Diagnosis not present

## 2015-09-05 LAB — CBC WITH DIFFERENTIAL/PLATELET
BASOS ABS: 0 10*3/uL (ref 0.0–0.1)
BASOS PCT: 0 %
Eosinophils Absolute: 0.1 10*3/uL (ref 0.0–0.7)
Eosinophils Relative: 2 %
HEMATOCRIT: 35.5 % — AB (ref 36.0–46.0)
HEMOGLOBIN: 11.6 g/dL — AB (ref 12.0–15.0)
Lymphocytes Relative: 22 %
Lymphs Abs: 1.1 10*3/uL (ref 0.7–4.0)
MCH: 28.5 pg (ref 26.0–34.0)
MCHC: 32.7 g/dL (ref 30.0–36.0)
MCV: 87.2 fL (ref 78.0–100.0)
Monocytes Absolute: 0.4 10*3/uL (ref 0.1–1.0)
Monocytes Relative: 8 %
NEUTROS ABS: 3.6 10*3/uL (ref 1.7–7.7)
NEUTROS PCT: 68 %
Platelets: 210 10*3/uL (ref 150–400)
RBC: 4.07 MIL/uL (ref 3.87–5.11)
RDW: 15.3 % (ref 11.5–15.5)
WBC: 5.2 10*3/uL (ref 4.0–10.5)

## 2015-09-05 LAB — COMPREHENSIVE METABOLIC PANEL
ALBUMIN: 3.7 g/dL (ref 3.5–5.0)
ALK PHOS: 98 U/L (ref 38–126)
ALT: 15 U/L (ref 14–54)
AST: 22 U/L (ref 15–41)
Anion gap: 11 (ref 5–15)
BILIRUBIN TOTAL: 0.5 mg/dL (ref 0.3–1.2)
BUN: 13 mg/dL (ref 6–20)
CALCIUM: 8.9 mg/dL (ref 8.9–10.3)
CO2: 24 mmol/L (ref 22–32)
CREATININE: 0.84 mg/dL (ref 0.44–1.00)
Chloride: 99 mmol/L — ABNORMAL LOW (ref 101–111)
GFR calc Af Amer: >60 mL/min (ref >60)
GFR calc non Af Amer: >60 mL/min (ref >60)
GLUCOSE: 333 mg/dL — AB (ref 65–99)
Potassium: 4.2 mmol/L (ref 3.5–5.1)
Sodium: 134 mmol/L — ABNORMAL LOW (ref 135–145)
TOTAL PROTEIN: 6.9 g/dL (ref 6.5–8.1)

## 2015-09-05 LAB — FERRITIN: FERRITIN: 200 ng/mL (ref 11–307)

## 2015-09-05 MED ORDER — HEPARIN SOD (PORK) LOCK FLUSH 100 UNIT/ML IV SOLN
INTRAVENOUS | Status: AC
  Filled 2015-09-05: qty 5

## 2015-09-05 MED ORDER — HEPARIN SOD (PORK) LOCK FLUSH 100 UNIT/ML IV SOLN
500.0000 [IU] | Freq: Once | INTRAVENOUS | Status: AC
  Administered 2015-09-05: 500 [IU] via INTRAVENOUS

## 2015-09-05 MED ORDER — SODIUM CHLORIDE 0.9 % IJ SOLN
10.0000 mL | INTRAMUSCULAR | Status: DC | PRN
  Administered 2015-09-05: 10 mL via INTRAVENOUS
  Filled 2015-09-05: qty 10

## 2015-09-05 NOTE — Progress Notes (Signed)
Kristina Rodriguez presented for Portacath access and flush. Portacath located right chest wall accessed with  H 20 needle.  Good blood return present. Portacath flushed with 76ml NS and 500U/52ml Heparin and needle removed intact.  Procedure tolerated well and without incident.

## 2015-09-05 NOTE — Patient Instructions (Signed)
[place] at [facility] Discharge Instructions  RECOMMENDATIONS MADE BY THE CONSULTANT AND ANY TEST RESULTS WILL BE SENT TO YOUR REFERRING PHYSICIAN.  Port flush with lab work today. Return as scheduled for port flushes. Return as scheduled for office visit.    Thank you for choosing [place] at [facility] to provide your oncology and hematology care.  To afford each patient quality time with our provider, please arrive at least 15 minutes before your scheduled appointment time.    You need to re-schedule your appointment should you arrive 10 or more minutes late.  We strive to give you quality time with our providers, and arriving late affects you and other patients whose appointments are after yours.  Also, if you no show three or more times for appointments you may be dismissed from the clinic at the providers discretion.     Again, thank you for choosing [facility].  Our hope is that these requests will decrease the amount of time that you wait before being seen by our physicians.       _____________________________________________________________  Should you have questions after your visit to [facility], please contact our office at [phone] between the hours of 8:30 a.m. and 4:30 p.m.  Voicemails left after 4:30 p.m. will not be returned until the following business day.  For prescription refill requests, have your pharmacy contact our office.

## 2015-09-06 LAB — CA 125: CA 125: 746.3 U/mL — AB (ref 0.0–38.1)

## 2015-10-31 ENCOUNTER — Encounter (HOSPITAL_COMMUNITY): Payer: Commercial Managed Care - HMO | Attending: Oncology

## 2015-10-31 ENCOUNTER — Encounter (HOSPITAL_BASED_OUTPATIENT_CLINIC_OR_DEPARTMENT_OTHER): Payer: Commercial Managed Care - HMO | Admitting: Hematology & Oncology

## 2015-10-31 VITALS — BP 146/54 | HR 103 | Temp 98.2°F | Resp 18 | Wt 158.0 lb

## 2015-10-31 DIAGNOSIS — R29898 Other symptoms and signs involving the musculoskeletal system: Secondary | ICD-10-CM | POA: Diagnosis present

## 2015-10-31 DIAGNOSIS — M542 Cervicalgia: Secondary | ICD-10-CM | POA: Insufficient documentation

## 2015-10-31 DIAGNOSIS — D509 Iron deficiency anemia, unspecified: Secondary | ICD-10-CM | POA: Diagnosis present

## 2015-10-31 DIAGNOSIS — Z95828 Presence of other vascular implants and grafts: Secondary | ICD-10-CM

## 2015-10-31 DIAGNOSIS — C549 Malignant neoplasm of corpus uteri, unspecified: Secondary | ICD-10-CM

## 2015-10-31 LAB — CBC WITH DIFFERENTIAL/PLATELET
BASOS ABS: 0 10*3/uL (ref 0.0–0.1)
BASOS PCT: 1 %
Eosinophils Absolute: 0 10*3/uL (ref 0.0–0.7)
Eosinophils Relative: 1 %
HEMATOCRIT: 32.6 % — AB (ref 36.0–46.0)
Hemoglobin: 10.5 g/dL — ABNORMAL LOW (ref 12.0–15.0)
LYMPHS PCT: 18 %
Lymphs Abs: 1.2 10*3/uL (ref 0.7–4.0)
MCH: 26.9 pg (ref 26.0–34.0)
MCHC: 32.2 g/dL (ref 30.0–36.0)
MCV: 83.4 fL (ref 78.0–100.0)
MONO ABS: 0.4 10*3/uL (ref 0.1–1.0)
Monocytes Relative: 7 %
NEUTROS ABS: 4.9 10*3/uL (ref 1.7–7.7)
NEUTROS PCT: 75 %
Platelets: 272 10*3/uL (ref 150–400)
RBC: 3.91 MIL/uL (ref 3.87–5.11)
RDW: 16.4 % — AB (ref 11.5–15.5)
WBC: 6.6 10*3/uL (ref 4.0–10.5)

## 2015-10-31 LAB — COMPREHENSIVE METABOLIC PANEL
ALBUMIN: 3.4 g/dL — AB (ref 3.5–5.0)
ALT: 12 U/L — AB (ref 14–54)
AST: 17 U/L (ref 15–41)
Alkaline Phosphatase: 181 U/L — ABNORMAL HIGH (ref 38–126)
Anion gap: 11 (ref 5–15)
BILIRUBIN TOTAL: 0.7 mg/dL (ref 0.3–1.2)
BUN: 18 mg/dL (ref 6–20)
CHLORIDE: 96 mmol/L — AB (ref 101–111)
CO2: 26 mmol/L (ref 22–32)
CREATININE: 0.81 mg/dL (ref 0.44–1.00)
Calcium: 9 mg/dL (ref 8.9–10.3)
GFR calc Af Amer: >60 mL/min (ref >60)
GFR calc non Af Amer: >60 mL/min (ref >60)
GLUCOSE: 269 mg/dL — AB (ref 65–99)
POTASSIUM: 4.8 mmol/L (ref 3.5–5.1)
Sodium: 133 mmol/L — ABNORMAL LOW (ref 135–145)
TOTAL PROTEIN: 7.1 g/dL (ref 6.5–8.1)

## 2015-10-31 MED ORDER — SODIUM CHLORIDE 0.9 % IJ SOLN
10.0000 mL | INTRAMUSCULAR | Status: DC | PRN
  Administered 2015-10-31: 10 mL via INTRAVENOUS
  Filled 2015-10-31: qty 10

## 2015-10-31 MED ORDER — SODIUM CHLORIDE 0.9 % IJ SOLN: 10.0000 mL | INTRAMUSCULAR | Status: DC | PRN

## 2015-10-31 MED ORDER — HEPARIN SOD (PORK) LOCK FLUSH 100 UNIT/ML IV SOLN: 500.0000 [IU] | Freq: Once | INTRAVENOUS | Status: DC

## 2015-10-31 MED ORDER — HEPARIN SOD (PORK) LOCK FLUSH 100 UNIT/ML IV SOLN
500.0000 [IU] | Freq: Once | INTRAVENOUS | Status: AC
  Administered 2015-10-31: 500 [IU] via INTRAVENOUS

## 2015-10-31 NOTE — Progress Notes (Signed)
Kristina Rodriguez presented for Portacath access and flush.   Portacath located right chest wall accessed with  H 20 needle.  Good blood return present. Portacath flushed with 84ml NS and 500U/53ml Heparin and needle removed intact.  Procedure tolerated well and without incident.    See office visit encounter for further information/details.

## 2015-10-31 NOTE — Progress Notes (Signed)
Kristina Rodriguez, [place] [address]   DIAGNOSIS: Uterine endometrial cancer, sarcoma   Staging form: Corpus Uteri - Carcinoma, AJCC 7th Edition     Pathologic: Stage IIIA (T3a, N0, M0) - Signed by Everitt Amber, MD on 06/29/2014  1. Stage III carcinosarcoma of the uterus, status post TAH, BSO, pelvic and para-aortic lymphadenectomy. Status post 2 cycles of carboplatin/Taxol resulting in severe peripheral neuropathy,  radiotherapy completed on 10/31/2014. 2. Iron deficiency anemia responsive to IV Feraheme   SUMMARY OF ONCOLOGIC HISTORY: Oncology History   74 year old woman with stage IIIA carcinosarcoma of the uterus (with microscopic ovarian involvement on final pathology, negative cervix, negative pelvic and para-aortic nodes).     Uterine endometrial cancer, sarcoma ([place])   06/12/2014 Initial Diagnosis Uterine endometrial cancer, sarcoma   06/16/2014 Definitive Surgery robotic hysterectomy, BSO, pelvic and para-aortic lymphadenectomy   07/13/2014 - 08/11/2014 Chemotherapy Paclitaxel and Carboplatin   08/24/2014 Adverse Reaction Significant peripheral neuropathy.  Chemotherapy held and cancelled.  She will move on to radiation therapy, sooner than planned.   09/20/2014 - 10/31/2014 Radiation Therapy 4500 cGy in 25 fractions to pelvis.  Proximal vagina/parametrial area boosted to cumulative dose of 5040 cGy.  Treated by Dr. Sondra Come.    CURRENT THERAPY: Observation  INTERVAL HISTORY: Kristina Rodriguez 74 y.o. female returns for follow-up of his stage IIIa carcinosarcoma of the uterus. She received 2 cycles of carboplatin and Taxol but no additional therapy secondary to severe peripheral neuropathy. The patient reports she still has symptoms of neuropathy with numbness in her fingers and toes and balls of her feet.   Kristina Rodriguez returns to the [facility] today with her husband.  She says she "ain't been doing nothing lately," but for Christmas, she is going to bake  a couple of pies and cook some. She comments that her husband helps with the gravy and potato salad.  In general, Kristina Rodriguez states that she doesn't feel so good. "I'm hurting." She says she hurts all in her stomach and up her side. She rarely complains, so this is significant. She can't remember when this started happening, but mentions that it's been a while. She says it was hurting a little bit at her last appointment, but not like this.   She says her pain isn't "all that bad most of the time." She says sometimes it's worse than other times, but not lately. She says it does not wake her up at night.  Kristina Rodriguez comments that her bowels are diarrhea most of the time. She says it happens every morning. She has taken immodium and laxatives interchangeably throughout all of this.  When asked if she's eating, she says "I'd hope so!" She says she has no trouble eating, and denies any nausea or vomiting  She also denies burning while urination or any problems there.  Her hands continue to bother her with neuropathy from the chemo.  She has had her flu shot this year.  MEDICAL HISTORY: Past Medical History  Diagnosis Date  . Diabetes mellitus, type II ([place])   . Hypertension   . Hyperlipidemia   . Osteoarthritis   . Iron deficiency anemia     attributed to long-term treatment with a PPI  . Anxiety and depression   . Hepatic steatosis 2006    mild  . Cholelithiasis 2006    Acute and chronic cholecystitis; laparoscopic cholecystectomy in 2006  . 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 Chronic pain   . Collagen vascular disease ([place])   . Anxiety   . Uterine cancer ([place]) 7/15    carcinosarcoma    has Diabetes mellitus, type II ([place]); Hypertension; Hyperlipidemia; Osteoarthritis; Iron deficiency anemia; Other chronic pain; Depressive disorder, not elsewhere classified; Screening for malignant neoplasm of the cervix; Uterine mass; Uterine endometrial  cancer, sarcoma ([place]); and Hypomagnesemia on her problem list.     is allergic to codeine.  Kristina Rodriguez does not currently have medications on file.  SURGICAL HISTORY: Past Surgical History  Procedure Laterality Date  . Tonsillectomy    . Carpal tunnel release  1994    Right  . Cholecystectomy, laparoscopic  2006    Dr. Romona Curls; cholelithiasis  . Colonoscopy  2003    Najeeb Rehman;iron deficiency anemia; normal study; hiatal hernia, gastritis, Schatzki's ring on EGD  . Breast biopsy  10/2012    Left; fibroadenoma  . Cholecystectomy    . Tubal ligation  1970s    removed  . Hysteroscopy w/d&c N/A 06/06/2014    Procedure: DILATATION AND CURETTAGE /HYSTEROSCOPY;  Surgeon: Jonnie Kind, MD;  Location: [facility];  Service: Gynecology;  Laterality: N/A;  . Polypectomy N/A 06/06/2014    Procedure: POLYPECTOMY;  Surgeon: Jonnie Kind, MD;  Location: [facility];  Service: Gynecology;  Laterality: N/A;  . Abdominal hysterectomy  06/16/14    robotic hysterectomy, BSO, pelvic and para-aortic lymphadenectomy for uterine carcinosarcoma  . Colonoscopy N/A 06/13/2015    Procedure: COLONOSCOPY;  Surgeon: Rogene Houston, MD;  Location: [facility];  Service: Endoscopy;  Laterality: N/A;  145    SOCIAL HISTORY: Social History   Social History  . Marital Status: Married    Spouse Name: N/A  . Number of Children: N/A  . Years of Education: N/A   Occupational History  . Not on file.   Social History Main Topics  . Smoking status: Never Smoker   . Smokeless tobacco: Never Used  . Alcohol Use: No     Comment: Minimal use in the past; none in recent years  . Drug Use: No  . Sexual Activity: Yes    Birth Control/ Protection: Post-menopausal   Other Topics Concern  . Not on file   Social History Narrative    FAMILY HISTORY: Family History  Problem Relation Age of Onset  . Cancer Mother 35    pancreas and liver  . Diabetes Brother   . Congestive Heart Failure Father   .  Hemochromatosis Brother   . Cancer Maternal Aunt   . Cancer Maternal Uncle     Review of Systems  Constitutional: Negative.   HENT: Negative.   Eyes: Negative.   Respiratory: Negative.   Cardiovascular: Negative.   Gastrointestinal: Negative.   Genitourinary: Negative. Musculoskeletal: Positive for back pain, joint pain and falls.       Fell on Saturday because she caught her foot on her cane. Skin: Negative.   Neurological: Negative. Endo/Heme/Allergies: Negative.   Psychiatric/Behavioral: Negative.   14 point review of systems was performed and is negative except as detailed under history of present illness and above   PHYSICAL EXAMINATION  ECOG PERFORMANCE STATUS: 1 - Symptomatic but completely ambulatory  Filed Vitals:   10/31/15 1147  BP: 146/54  Pulse: 103  Temp: 98.2 F (36.8 C)  Resp: 18    Physical Exam  Constitutional: She is oriented to person, place, and time and well-developed, well-nourished, and in no distress.  Uses cane; able to ambulate to the exam table without it. Minor difficulty standing. HENT:  Head: Normocephalic and atraumatic.  Nose: Nose normal.  Mouth/Throat: Oropharynx is clear and moist. No oropharyngeal exudate.  Eyes: Conjunctivae and EOM are normal. Pupils are equal, round, and reactive to light. Right eye exhibits no discharge. Left eye exhibits no discharge. No scleral icterus.  Neck: Normal range of motion. Neck supple. No tracheal deviation present. No thyromegaly present.  Cardiovascular: Normal rate, regular rhythm and normal heart sounds.  Exam reveals no gallop and no friction rub.   No murmur heard. Pulmonary/Chest: Effort normal and breath sounds normal. She has no wheezes. She has no rales.  Abdominal: Soft. Bowel sounds are normal. She exhibits no distension and no mass. There is tenderness. There is no rebound.  Musculoskeletal: Normal range of motion. She exhibits no edema.  Lymphadenopathy:    She has no cervical  adenopathy.  Neurological: She is alert and oriented to person, place, and time. She has normal reflexes. No cranial nerve deficit. Gait normal. Coordination normal.  Skin: Skin is warm and dry. No rash noted.  Psychiatric: Mood, memory, affect and judgment normal.  Nursing note and vitals reviewed.  RADIOLOGY: I have reviewed the images detailed below:  CLINICAL DATA: Followup endometrial carcinosarcoma status post chemotherapy and radiation therapy. Diarrhea.  EXAM: CT ABDOMEN AND PELVIS WITH CONTRAST  TECHNIQUE: Multidetector CT imaging of the abdomen and pelvis was performed using the standard protocol following bolus administration of intravenous contrast.  CONTRAST: 158m OMNIPAQUE IOHEXOL 300 MG/ML SOLN  COMPARISON: 05/09/2014  FINDINGS: Lower Chest: Patchy airspace disease is seen in the inferior aspect of the right middle lobe which is new since previous study, and suspicious for infectious or inflammatory process. No discrete nodules or masses are seen in visualized portions of lung bases.  Hepatobiliary: Prior cholecystectomy noted. No evidence of biliary dilatation. Small cyst seen adjacent to the gallbladder fossa remains stable. A sub-cm low-attenuation lesion in the lateral segment left hepatic lobe on image 18/series 2 remains stable. This is too small to characterize but most likely represents a tiny benign hemangioma or cyst. No new or enlarging liver lesions identified.  Pancreas: No mass, inflammatory changes, or other significant abnormality identified.  Spleen: Within normal limits in size and appearance.  Adrenals: No masses identified.  Kidneys/Urinary Tract: No evidence of masses or hydronephrosis. Stable small benign-appearing right renal cysts again noted.  Stomach/Bowel/Peritoneum: No evidence of wall thickening, mass, or obstruction. Mild sigmoid diverticulosis noted, without  evidence diverticulitis.  Vascular/Lymphatic: No pathologically enlarged lymph nodes identified. No other significant abnormality visualized.  Reproductive: Patient has undergone hysterectomy since previous study. A fluid collection with thin enhancing rim is seen along the left pelvic sidewall and iliac vessels which measures 6.5 x 9.2 cm. This is consistent with a postop lymphocele. No other adnexal masses are identified.  Other: None.  Musculoskeletal: No suspicious bone lesions identified.  IMPRESSION: Interval hysterectomy. 9 cm fluid collection along the left pelvic sidewall and iliac vessels, consistent with postop lymphocele.  No evidence of residual or metastatic carcinoma within the pelvis or abdomen.  New patchy airspace disease seen in right middle lobe, suspicious for infectious or inflammatory process. Recommend clinical correlation and chest radiograph for further evaluation.   Electronically Signed  By: JEarle GellM.D.  On: 03/16/2015 11:51   LABORATORY DATA: I have reviewed the data as listed. Results for Kristina Rodriguez, Kristina Rodriguez(MRN [id number] as of 11/06/2015 18:43  Ref. Range 10/31/2015 12:20  Sodium Latest Ref Range: 135-145 mmol/L 133 (L)  Potassium Latest Ref Range: 3.5-5.1 mmol/L 4.8  Chloride Latest Ref Range: 101-111 mmol/L 96 (L)  CO2 Latest Ref Range: 22-32 mmol/L 26  BUN Latest Ref Range: 6-20 mg/dL 18  Creatinine Latest Ref Range: 0.44-1.00 mg/dL 0.81  Calcium Latest Ref Range: 8.9-10.3 mg/dL 9.0  EGFR (Non-African Amer.) Latest Ref Range: >60 mL/min >60  EGFR (African American) Latest Ref Range: >60 mL/min >60  Glucose Latest Ref Range: 65-99 mg/dL 269 (H)  Anion gap Latest Ref Range: 5-15  11  Alkaline Phosphatase Latest Ref Range: 38-126 U/L 181 (H)  Albumin Latest Ref Range: 3.5-5.0 g/dL 3.4 (L)  AST Latest Ref Range: 15-41 U/L 17  ALT Latest Ref Range: 14-54 U/L 12 (L)  Total Protein Latest Ref Range: 6.5-8.1 g/dL 7.1   Total Bilirubin Latest Ref Range: 0.3-1.2 mg/dL 0.7  Ferritin Latest Ref Range: 11-307 ng/mL 367 (H)  WBC Latest Ref Range: 4.0-10.5 K/uL 6.6  RBC Latest Ref Range: 3.87-5.11 MIL/uL 3.91  Hemoglobin Latest Ref Range: 12.0-15.0 g/dL 10.5 (L)  HCT Latest Ref Range: 36.0-46.0 % 32.6 (L)  MCV Latest Ref Range: 78.0-100.0 fL 83.4  MCH Latest Ref Range: 26.0-34.0 pg 26.9  MCHC Latest Ref Range: 30.0-36.0 g/dL 32.2  RDW Latest Ref Range: 11.5-15.5 % 16.4 (H)  Platelets Latest Ref Range: 150-400 K/uL 272  Neutrophils Latest Units: % 75  Lymphocytes Latest Units: % 18  Monocytes Relative Latest Units: % 7  Eosinophil Latest Units: % 1  Basophil Latest Units: % 1  NEUT# Latest Ref Range: 1.7-7.7 K/uL 4.9  Lymphocyte # Latest Ref Range: 0.7-4.0 K/uL 1.2  Monocyte # Latest Ref Range: 0.1-1.0 K/uL 0.4  Eosinophils Absolute Latest Ref Range: 0.0-0.7 K/uL 0.0  Basophils Absolute Latest Ref Range: 0.0-0.1 K/uL 0.0  CA 125 Latest Ref Range: 0.0-38.1 U/mL 3062.0 (H)    ASSESSMENT and THERAPY PLAN:  Stage III carcinosarcoma of the uterus, status post TAH, BSO, pelvic and para-aortic lymphadenectomy. Status post 2 cycles of carboplatin/Taxol resulting in severe peripheral neuropathy,  radiotherapy completed on 10/31/2014. Iron deficiency anemia responsive to IV Feraheme   She has had an elevated CA-125 level for some time with last scans in May not showing obvious recurrence. Based on her symptoms today and CA-125 I am much more suspicious of recurrent disease. She is agreeable to proceeding with imaging but wishes to wait until after the Holiday.   If there is something concerning on her scans, we will follow up with her regarding the results. She is agreeable to come in and discuss with me if her CT is abnormal.  Otherwise, we will follow-up with her in 3 months.  Orders Placed This Encounter  Procedures  . CT Abdomen Pelvis W Contrast    Standing Status: Future     Number of Occurrences:       Standing Expiration Date: 10/30/2016    Order Specific Question:  If indicated for the ordered procedure, I authorize the administration of contrast media per Radiology protocol    Answer:  Yes    Order Specific Question:  Reason for Exam (SYMPTOM  OR DIAGNOSIS REQUIRED)    Answer:  endometrial carcinoma    Order Specific Question:  Preferred imaging location?    Answer:  [facility]   All questions were answered. The patient knows to call the clinic with any problems, questions or concerns. We can certainly see the patient much sooner if necessary.  This document serves as a  record of services personally performed by Ancil Linsey, MD. It was created on her behalf by Toni Amend, a trained medical scribe. The creation of this record is based on the scribe's personal observations and the provider's statements to them. This document has been checked and approved by the attending provider.  I have reviewed the above documentation for accuracy and completeness, and I agree with the above.  Molli Hazard, MD  10/31/2015

## 2015-10-31 NOTE — Patient Instructions (Addendum)
Kristina Rodriguez at [facility] Discharge Instructions  RECOMMENDATIONS MADE BY THE CONSULTANT AND ANY TEST RESULTS WILL BE SENT TO YOUR REFERRING PHYSICIAN.   Exam completed by Dr Whitney Muse today Port flush today with lab work Port flush every 8 weeks CT scans after christmas, if they are normal then we will discuss it and see you in 3 months If we see something then we will call you so we can make you an appt to discuss the CT scan. Return to see the doctor in 3 months with lab work Please call the clinic if you have any questions or concerns    Thank you for choosing [facility] to provide your oncology and hematology care.  To afford each patient quality time with our provider, please arrive at least 15 minutes before your scheduled appointment time.    You need to re-schedule your appointment should you arrive 10 or more minutes late.  We strive to give you quality time with our providers, and arriving late affects you and other patients whose appointments are after yours.  Also, if you no show three or more times for appointments you may be dismissed from the clinic at the providers discretion.     Again, thank you for choosing [facility].  Our hope is that these requests will decrease the amount of time that you wait before being seen by our physicians.       _____________________________________________________________  Should you have questions after your visit to [facility], please contact our office at [phone] between the hours of 8:30 a.m. and 4:30 p.m.  Voicemails left after 4:30 p.m. will not be returned until the following business day.  For prescription refill requests, have your pharmacy contact our office.

## 2015-11-01 LAB — CA 125: CA 125: 3062 U/mL — ABNORMAL HIGH (ref 0.0–38.1)

## 2015-11-01 LAB — FERRITIN: FERRITIN: 367 ng/mL — AB (ref 11–307)

## 2015-11-06 ENCOUNTER — Encounter (HOSPITAL_COMMUNITY): Payer: Self-pay | Admitting: Hematology & Oncology

## 2015-11-08 ENCOUNTER — Ambulatory Visit (HOSPITAL_COMMUNITY)
Admission: RE | Admit: 2015-11-08 | Discharge: 2015-11-08 | Disposition: A | Discharge status: Home / Self Care | Payer: Commercial Managed Care - HMO | Source: Ambulatory Visit | Attending: Hematology & Oncology | Admitting: Hematology & Oncology

## 2015-11-08 DIAGNOSIS — R590 Localized enlarged lymph nodes: Secondary | ICD-10-CM | POA: Insufficient documentation

## 2015-11-08 DIAGNOSIS — C787 Secondary malignant neoplasm of liver and intrahepatic bile duct: Secondary | ICD-10-CM | POA: Diagnosis not present

## 2015-11-08 DIAGNOSIS — R109 Unspecified abdominal pain: Secondary | ICD-10-CM | POA: Diagnosis not present

## 2015-11-08 DIAGNOSIS — C7951 Secondary malignant neoplasm of bone: Secondary | ICD-10-CM | POA: Insufficient documentation

## 2015-11-08 DIAGNOSIS — R197 Diarrhea, unspecified: Secondary | ICD-10-CM | POA: Insufficient documentation

## 2015-11-08 DIAGNOSIS — R918 Other nonspecific abnormal finding of lung field: Secondary | ICD-10-CM | POA: Diagnosis not present

## 2015-11-08 DIAGNOSIS — Z923 Personal history of irradiation: Secondary | ICD-10-CM | POA: Insufficient documentation

## 2015-11-08 DIAGNOSIS — C549 Malignant neoplasm of corpus uteri, unspecified: Secondary | ICD-10-CM | POA: Diagnosis not present

## 2015-11-08 DIAGNOSIS — I898 Other specified noninfective disorders of lymphatic vessels and lymph nodes: Secondary | ICD-10-CM | POA: Insufficient documentation

## 2015-11-08 DIAGNOSIS — Z9221 Personal history of antineoplastic chemotherapy: Secondary | ICD-10-CM | POA: Diagnosis not present

## 2015-11-08 DIAGNOSIS — Z90710 Acquired absence of both cervix and uterus: Secondary | ICD-10-CM | POA: Diagnosis not present

## 2015-11-08 DIAGNOSIS — Z08 Encounter for follow-up examination after completed treatment for malignant neoplasm: Secondary | ICD-10-CM | POA: Insufficient documentation

## 2015-11-08 MED ORDER — IOHEXOL 300 MG/ML  SOLN
100.0000 mL | Freq: Once | INTRAMUSCULAR | Status: AC | PRN
  Administered 2015-11-08: 100 mL via INTRAVENOUS

## 2015-11-13 ENCOUNTER — Telehealth (HOSPITAL_COMMUNITY): Payer: Self-pay

## 2015-11-13 NOTE — Telephone Encounter (Signed)
Message left for patient.  Call back confirmation requested. 

## 2015-11-13 NOTE — Telephone Encounter (Signed)
-----   Message from Louis Meckel sent at 11/13/2015  3:30 PM EST ----- Regarding: please call for appt Please call patient with appt for Dr on 1/5 due to scans.  Please call patient

## 2015-11-14 ENCOUNTER — Telehealth (HOSPITAL_COMMUNITY): Payer: Self-pay

## 2015-11-14 NOTE — Telephone Encounter (Signed)
Call back from patient to confirm appointment with Dr. Whitney Muse to review scans on 11/15/15.  States "I've been sick with cough for a few days and went to [facility] yesterday so not sure will be there tomorrow."  Instructed to call office in the morning to reschedule if needed.

## 2015-11-14 NOTE — Telephone Encounter (Signed)
-----   Message from Louis Meckel sent at 11/13/2015  3:30 PM EST ----- Regarding: please call for appt Please call patient with appt for Dr on 1/5 due to scans.  Please call patient

## 2015-11-15 ENCOUNTER — Ambulatory Visit (HOSPITAL_COMMUNITY): Payer: Commercial Managed Care - HMO | Admitting: Hematology & Oncology

## 2015-11-19 ENCOUNTER — Ambulatory Visit (HOSPITAL_COMMUNITY): Payer: Commercial Managed Care - HMO | Admitting: Hematology & Oncology

## 2015-11-23 ENCOUNTER — Inpatient Hospital Stay (HOSPITAL_COMMUNITY)
Admission: EM | Admit: 2015-11-23 | Discharge: 2015-11-28 | DRG: 683 | Disposition: A | Discharge status: Hospice | Payer: Commercial Managed Care - HMO | Attending: Internal Medicine | Admitting: Internal Medicine

## 2015-11-23 ENCOUNTER — Encounter (HOSPITAL_COMMUNITY): Payer: Self-pay | Admitting: *Deleted

## 2015-11-23 ENCOUNTER — Ambulatory Visit (HOSPITAL_COMMUNITY)
Admission: RE | Admit: 2015-11-23 | Discharge: 2015-11-23 | Disposition: A | Discharge status: Home / Self Care | Payer: Commercial Managed Care - HMO | Source: Ambulatory Visit | Attending: Internal Medicine | Admitting: Internal Medicine

## 2015-11-23 ENCOUNTER — Other Ambulatory Visit (HOSPITAL_COMMUNITY): Payer: Self-pay | Admitting: Internal Medicine

## 2015-11-23 ENCOUNTER — Emergency Department (HOSPITAL_COMMUNITY): Payer: Commercial Managed Care - HMO

## 2015-11-23 ENCOUNTER — Inpatient Hospital Stay (HOSPITAL_COMMUNITY)
Admission: RE | Admit: 2015-11-23 | Discharge: 2015-11-23 | DRG: 641 | Disposition: A | Discharge status: Hospice | Payer: Commercial Managed Care - HMO | Source: Ambulatory Visit | Attending: Internal Medicine | Admitting: Internal Medicine

## 2015-11-23 DIAGNOSIS — N179 Acute kidney failure, unspecified: Secondary | ICD-10-CM | POA: Diagnosis present

## 2015-11-23 DIAGNOSIS — G8929 Other chronic pain: Secondary | ICD-10-CM | POA: Diagnosis present

## 2015-11-23 DIAGNOSIS — I10 Essential (primary) hypertension: Secondary | ICD-10-CM | POA: Diagnosis present

## 2015-11-23 DIAGNOSIS — C7800 Secondary malignant neoplasm of unspecified lung: Secondary | ICD-10-CM | POA: Diagnosis not present

## 2015-11-23 DIAGNOSIS — C787 Secondary malignant neoplasm of liver and intrahepatic bile duct: Secondary | ICD-10-CM | POA: Diagnosis present

## 2015-11-23 DIAGNOSIS — Z833 Family history of diabetes mellitus: Secondary | ICD-10-CM

## 2015-11-23 DIAGNOSIS — C7801 Secondary malignant neoplasm of right lung: Secondary | ICD-10-CM | POA: Diagnosis present

## 2015-11-23 DIAGNOSIS — C549 Malignant neoplasm of corpus uteri, unspecified: Secondary | ICD-10-CM | POA: Diagnosis present

## 2015-11-23 DIAGNOSIS — E46 Unspecified protein-calorie malnutrition: Secondary | ICD-10-CM | POA: Diagnosis present

## 2015-11-23 DIAGNOSIS — E440 Moderate protein-calorie malnutrition: Secondary | ICD-10-CM | POA: Diagnosis present

## 2015-11-23 DIAGNOSIS — E119 Type 2 diabetes mellitus without complications: Secondary | ICD-10-CM | POA: Diagnosis present

## 2015-11-23 DIAGNOSIS — R531 Weakness: Secondary | ICD-10-CM

## 2015-11-23 DIAGNOSIS — M1990 Unspecified osteoarthritis, unspecified site: Secondary | ICD-10-CM | POA: Diagnosis present

## 2015-11-23 DIAGNOSIS — Z9049 Acquired absence of other specified parts of digestive tract: Secondary | ICD-10-CM

## 2015-11-23 DIAGNOSIS — Z923 Personal history of irradiation: Secondary | ICD-10-CM

## 2015-11-23 DIAGNOSIS — Z6825 Body mass index (BMI) 25.0-25.9, adult: Secondary | ICD-10-CM

## 2015-11-23 DIAGNOSIS — E1122 Type 2 diabetes mellitus with diabetic chronic kidney disease: Secondary | ICD-10-CM | POA: Diagnosis present

## 2015-11-23 DIAGNOSIS — Z7984 Long term (current) use of oral hypoglycemic drugs: Secondary | ICD-10-CM

## 2015-11-23 DIAGNOSIS — R059 Cough, unspecified: Secondary | ICD-10-CM

## 2015-11-23 DIAGNOSIS — E860 Dehydration: Secondary | ICD-10-CM | POA: Diagnosis not present

## 2015-11-23 DIAGNOSIS — C779 Secondary and unspecified malignant neoplasm of lymph node, unspecified: Secondary | ICD-10-CM | POA: Diagnosis present

## 2015-11-23 DIAGNOSIS — Z809 Family history of malignant neoplasm, unspecified: Secondary | ICD-10-CM

## 2015-11-23 DIAGNOSIS — R05 Cough: Secondary | ICD-10-CM | POA: Diagnosis present

## 2015-11-23 DIAGNOSIS — E785 Hyperlipidemia, unspecified: Secondary | ICD-10-CM | POA: Diagnosis present

## 2015-11-23 DIAGNOSIS — I129 Hypertensive chronic kidney disease with stage 1 through stage 4 chronic kidney disease, or unspecified chronic kidney disease: Secondary | ICD-10-CM | POA: Diagnosis present

## 2015-11-23 DIAGNOSIS — Z8249 Family history of ischemic heart disease and other diseases of the circulatory system: Secondary | ICD-10-CM

## 2015-11-23 DIAGNOSIS — C7951 Secondary malignant neoplasm of bone: Secondary | ICD-10-CM | POA: Diagnosis present

## 2015-11-23 DIAGNOSIS — C541 Malignant neoplasm of endometrium: Secondary | ICD-10-CM | POA: Diagnosis present

## 2015-11-23 DIAGNOSIS — F329 Major depressive disorder, single episode, unspecified: Secondary | ICD-10-CM | POA: Diagnosis present

## 2015-11-23 DIAGNOSIS — F419 Anxiety disorder, unspecified: Secondary | ICD-10-CM | POA: Diagnosis present

## 2015-11-23 DIAGNOSIS — Z90710 Acquired absence of both cervix and uterus: Secondary | ICD-10-CM

## 2015-11-23 DIAGNOSIS — C55 Malignant neoplasm of uterus, part unspecified: Secondary | ICD-10-CM | POA: Diagnosis not present

## 2015-11-23 DIAGNOSIS — Z79899 Other long term (current) drug therapy: Secondary | ICD-10-CM

## 2015-11-23 DIAGNOSIS — K760 Fatty (change of) liver, not elsewhere classified: Secondary | ICD-10-CM | POA: Diagnosis present

## 2015-11-23 DIAGNOSIS — Y92009 Unspecified place in unspecified non-institutional (private) residence as the place of occurrence of the external cause: Secondary | ICD-10-CM

## 2015-11-23 DIAGNOSIS — N183 Chronic kidney disease, stage 3 (moderate): Secondary | ICD-10-CM | POA: Diagnosis present

## 2015-11-23 DIAGNOSIS — Z9221 Personal history of antineoplastic chemotherapy: Secondary | ICD-10-CM

## 2015-11-23 DIAGNOSIS — W19XXXA Unspecified fall, initial encounter: Secondary | ICD-10-CM | POA: Diagnosis present

## 2015-11-23 LAB — COMPREHENSIVE METABOLIC PANEL
ALK PHOS: 395 U/L — AB (ref 38–126)
ALT: 12 U/L — AB (ref 14–54)
AST: 13 U/L — ABNORMAL LOW (ref 15–41)
Albumin: 2.9 g/dL — ABNORMAL LOW (ref 3.5–5.0)
Anion gap: 24 — ABNORMAL HIGH (ref 5–15)
BUN: 33 mg/dL — ABNORMAL HIGH (ref 6–20)
CALCIUM: 9.1 mg/dL (ref 8.9–10.3)
CO2: 15 mmol/L — ABNORMAL LOW (ref 22–32)
CREATININE: 1.39 mg/dL — AB (ref 0.44–1.00)
Chloride: 94 mmol/L — ABNORMAL LOW (ref 101–111)
GFR, EST AFRICAN AMERICAN: 42 mL/min — AB (ref >60)
GFR, EST NON AFRICAN AMERICAN: 36 mL/min — AB (ref >60)
Glucose, Bld: 488 mg/dL — ABNORMAL HIGH (ref 65–99)
Potassium: 4.5 mmol/L (ref 3.5–5.1)
Sodium: 133 mmol/L — ABNORMAL LOW (ref 135–145)
TOTAL PROTEIN: 7.3 g/dL (ref 6.5–8.1)
Total Bilirubin: 2 mg/dL — ABNORMAL HIGH (ref 0.3–1.2)

## 2015-11-23 LAB — URINALYSIS, ROUTINE W REFLEX MICROSCOPIC
Ketones, ur: >80 mg/dL — AB
Leukocytes, UA: NEGATIVE
Nitrite: NEGATIVE
PH: 5.5 (ref 5.0–8.0)
Protein, ur: NEGATIVE mg/dL
SPECIFIC GRAVITY, URINE: 1.015 (ref 1.005–1.030)

## 2015-11-23 LAB — CBC WITH DIFFERENTIAL/PLATELET
BASOS PCT: 0 %
Basophils Absolute: 0 10*3/uL (ref 0.0–0.1)
EOS ABS: 0 10*3/uL (ref 0.0–0.7)
Eosinophils Relative: 0 %
HCT: 36.2 % (ref 36.0–46.0)
HEMOGLOBIN: 11.4 g/dL — AB (ref 12.0–15.0)
LYMPHS ABS: 0.8 10*3/uL (ref 0.7–4.0)
Lymphocytes Relative: 6 %
MCH: 25.9 pg — AB (ref 26.0–34.0)
MCHC: 31.5 g/dL (ref 30.0–36.0)
MCV: 82.1 fL (ref 78.0–100.0)
Monocytes Absolute: 0.9 10*3/uL (ref 0.1–1.0)
Monocytes Relative: 7 %
NEUTROS ABS: 11.4 10*3/uL — AB (ref 1.7–7.7)
NEUTROS PCT: 86 %
Platelets: 295 10*3/uL (ref 150–400)
RBC: 4.41 MIL/uL (ref 3.87–5.11)
RDW: 17.1 % — ABNORMAL HIGH (ref 11.5–15.5)
WBC: 13.2 10*3/uL — AB (ref 4.0–10.5)

## 2015-11-23 LAB — URINE MICROSCOPIC-ADD ON: WBC, UA: NONE SEEN WBC/hpf (ref 0–5)

## 2015-11-23 LAB — I-STAT CG4 LACTIC ACID, ED: Lactic Acid, Venous: 1.39 mmol/L (ref 0.5–2.0)

## 2015-11-23 MED ORDER — SODIUM CHLORIDE 0.9 % IV BOLUS (SEPSIS)
1000.0000 mL | Freq: Once | INTRAVENOUS | Status: AC
  Administered 2015-11-23: 1000 mL via INTRAVENOUS

## 2015-11-23 MED ORDER — IOHEXOL 350 MG/ML SOLN
80.0000 mL | Freq: Once | INTRAVENOUS | Status: AC | PRN
  Administered 2015-11-23: 80 mL via INTRAVENOUS

## 2015-11-23 MED ORDER — ONDANSETRON HCL 4 MG/2ML IJ SOLN
4.0000 mg | Freq: Once | INTRAMUSCULAR | Status: AC
  Administered 2015-11-23: 4 mg via INTRAVENOUS
  Filled 2015-11-23: qty 2

## 2015-11-23 MED ORDER — VANCOMYCIN HCL IN DEXTROSE 1-5 GM/200ML-% IV SOLN
1000.0000 mg | Freq: Once | INTRAVENOUS | Status: AC
Start: 2015-11-23 — End: 2015-11-24
  Administered 2015-11-24: 1000 mg via INTRAVENOUS
  Filled 2015-11-23: qty 200

## 2015-11-23 MED ORDER — DEXTROSE 5 % IV SOLN
2.0000 g | Freq: Once | INTRAVENOUS | Status: AC
  Administered 2015-11-23: 2 g via INTRAVENOUS
  Filled 2015-11-23: qty 2

## 2015-11-23 MED ORDER — SODIUM CHLORIDE 0.9 % IV SOLN
Freq: Once | INTRAVENOUS | Status: AC
  Administered 2015-11-23: 19:00:00 via INTRAVENOUS

## 2015-11-23 MED ORDER — MORPHINE SULFATE (PF) 4 MG/ML IV SOLN
4.0000 mg | INTRAVENOUS | Status: DC | PRN
  Administered 2015-11-23: 4 mg via INTRAVENOUS
  Filled 2015-11-23: qty 1

## 2015-11-23 NOTE — ED Notes (Signed)
Patient transported to CT 

## 2015-11-23 NOTE — ED Provider Notes (Addendum)
CSN: [id number]     Arrival date & time 11/23/15  1832 History   First MD Initiated Contact with Patient 11/23/15 1854     Chief Complaint  Patient presents with  . Pneumonia      HPI  Patient presents for evaluation of the possible pneumonia. Patient has a history of recurrent and now metastatic uterine cancer. Followed with Dr. Toney Rakes [facility].   She has had "cold" with cough over the last several days. Is now developing Weakness progressive over the last several days. Fell at home this morning his difficulty with balance strength is been short of breath. Seen by her primary care physician Dr. Willey Blade. Outpatient x-ray was obtained. He called them at home short time later and told to come emergently admitted for "pneumonia".  Patient is status post 2 cycles of carboplatin/Taxol. Had severe peripheral neuropathy. Had radiotherapy that completed 10/31/2014.  Started developing some abdominal pain in December and had recurrent imaging obtained on 1229. The patient has been weak and sick at home and unable to make several planned appointments with Dr. Dutch Gray office to discuss her follow-up. Unfortunately, she's not been able to have received the news that her CT scan does show widely metastatic disease throughout her abdomen.     Past Medical History  Diagnosis Date  . Diabetes mellitus, type II ([place])   . Hypertension   . Hyperlipidemia   . Osteoarthritis   . Iron deficiency anemia     attributed to long-term treatment with a PPI  . Anxiety and depression   . Hepatic steatosis 2006    mild  . Cholelithiasis 2006    Acute and chronic cholecystitis; laparoscopic cholecystectomy in 2006  . 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 Chronic pain   . Collagen vascular disease ([place])   . Anxiety   . 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 Tonsillectomy    . Carpal tunnel  release  1994    Right  . Cholecystectomy, laparoscopic  2006    Dr. Romona Curls; cholelithiasis  . Colonoscopy  2003    Najeeb Rehman;iron deficiency anemia; normal study; hiatal hernia, gastritis, Schatzki's ring on EGD  . Breast biopsy  10/2012    Left; fibroadenoma  . Cholecystectomy    . Tubal ligation  1970s    removed  . Hysteroscopy w/d&c N/A 06/06/2014    Procedure: DILATATION AND CURETTAGE /HYSTEROSCOPY;  Surgeon: Jonnie Kind, MD;  Location: [facility];  Service: Gynecology;  Laterality: N/A;  . Polypectomy N/A 06/06/2014    Procedure: POLYPECTOMY;  Surgeon: Jonnie Kind, MD;  Location: [facility];  Service: Gynecology;  Laterality: N/A;  . Abdominal hysterectomy  06/16/14    robotic hysterectomy, BSO, pelvic and para-aortic lymphadenectomy for uterine carcinosarcoma  . Colonoscopy N/A 06/13/2015    Procedure: COLONOSCOPY;  Surgeon: Rogene Houston, MD;  Location: [facility];  Service: Endoscopy;  Laterality: N/A;  145   Family History  Problem Relation Age of Onset  . Cancer Mother 47    pancreas and liver  . Diabetes Brother   . Congestive Heart Failure Father   . Hemochromatosis Brother   . Cancer Maternal Aunt   . Cancer Maternal Uncle    Social History  Substance Use Topics  . Smoking status: Never Smoker   . Smokeless tobacco: Never Used  . Alcohol Use: No  Comment: Minimal use in the past; none in recent years   OB History    No data available     Review of Systems  Constitutional: Positive for activity change, appetite change and fatigue. Negative for fever, chills and diaphoresis.  HENT: Positive for rhinorrhea and sinus pressure. Negative for mouth sores, sore throat and trouble swallowing.   Eyes: Negative for visual disturbance.  Respiratory: Positive for cough and shortness of breath. Negative for chest tightness and wheezing.   Cardiovascular: Negative for chest pain.  Gastrointestinal: Negative for nausea, vomiting, abdominal pain, diarrhea and  abdominal distention.  Endocrine: Negative for polydipsia, polyphagia and polyuria.  Genitourinary: Negative for dysuria, frequency and hematuria.  Musculoskeletal: Negative for gait problem.  Skin: Negative for color change, pallor and rash.  Neurological: Positive for dizziness and weakness. Negative for syncope, light-headedness and headaches.  Hematological: Does not bruise/bleed easily.  Psychiatric/Behavioral: Negative for behavioral problems and confusion.      Allergies  Codeine  Home Medications   Prior to Admission medications   Medication Sig Start Date End Date Taking? Authorizing Provider  ALPRAZolam Duanne Moron) 1 MG tablet Take 1 mg by mouth 3 (three) times daily as needed for anxiety.     Historical Provider, MD  atorvastatin (LIPITOR) 40 MG tablet Take 40 mg by mouth daily.  09/19/12   Historical Provider, MD  azithromycin (ZITHROMAX) 250 MG tablet  11/13/15   Historical Provider, MD  diphenoxylate-atropine (LOMOTIL) 2.5-0.025 MG tablet  10/24/15   Historical Provider, MD  furosemide (LASIX) 40 MG tablet Take one tablet daily in the morning or as directed for fluid retention. 10/25/14   Farrel Gobble, MD  gabapentin (NEURONTIN) 600 MG tablet Take 600 mg by mouth 3 (three) times daily.  02/12/15   Historical Provider, MD  glipiZIDE (GLUCOTROL) 5 MG tablet Take 5 mg by mouth 2 (two) times daily before a meal.     Historical Provider, MD  HYDROcodone-acetaminophen (NORCO/VICODIN) 5-325 MG per tablet Take 1 tablet by mouth every 6 (six) hours as needed for moderate pain.    Historical Provider, MD  hyoscyamine (LEVSIN SL) 0.125 MG SL tablet Place 1 tablet (0.125 mg total) under the tongue 3 (three) times daily as needed. Patient not taking: Reported on 10/31/2015 06/13/15   Rogene Houston, MD  lidocaine-prilocaine (EMLA) cream Apply 1 application topically as needed (when port is accessed.).  07/05/14   Historical Provider, MD  losartan (COZAAR) 100 MG tablet Take 100 mg by mouth  every morning.     Historical Provider, MD  Menthol-Methyl Salicylate (MUSCLE RUB) 10-15 % CREA Apply 1 application topically as needed for muscle pain (apply to hip).    Historical Provider, MD  metFORMIN (GLUCOPHAGE) 1000 MG tablet Take 1,000 mg by mouth 2 (two) times daily with a meal.    Historical Provider, MD  omeprazole (PRILOSEC) 40 MG capsule Take 40 mg by mouth daily.    Historical Provider, MD  pioglitazone (ACTOS) 15 MG tablet Take 15 mg by mouth daily.    Historical Provider, MD  potassium chloride SA (K-DUR,KLOR-CON) 20 MEQ tablet Take 1 tablet (20 mEq total) by mouth 2 (two) times daily. Patient taking differently: Take 20 mEq by mouth daily as needed (when taking fluid pill).  10/25/14   Farrel Gobble, MD  Wheat Dextrin (BENEFIBER DRINK MIX) PACK Take 4 g by mouth at bedtime. 06/13/15   Rogene Houston, MD   BP 164/75 mmHg  Pulse 120  Temp(Src) 98.1 F (36.7 C) (Oral)  Resp 24  Ht 5\' 6"  (1.676 m)  Wt 158 lb (71.668 kg)  BMI 25.51 kg/m2  SpO2 98% Physical Exam  Constitutional: She is oriented to person, place, and time. She appears listless. She appears ill. No distress.  Elderly female. Appears ill. Prefers to keep her eyes closed. Intermittently moaning. Will answer simple questions. Her family supplements the majority of her history.  HENT:  Head: Normocephalic.  Eyes: Conjunctivae are normal. Pupils are equal, round, and reactive to light. No scleral icterus.  Neck: Normal range of motion. Neck supple. No thyromegaly present.  Cardiovascular: Normal rate and regular rhythm.  Exam reveals no gallop and no friction rub.   No murmur heard. Resting tachycardia 1:15.  Pulmonary/Chest: Effort normal. No respiratory distress. She has no wheezes. She has rhonchi. She has no rales.  No prolongation. No focal diminished breath sounds. Occasional scattered rhonchi  Abdominal: Soft. Bowel sounds are normal. She exhibits no distension. There is no tenderness. There is no rebound.   Musculoskeletal: Normal range of motion.  Neurological: She is oriented to person, place, and time. She appears listless.  Moves all 4 extremities without focal deficits. Generalized weakness. Requires assistance simply to sit up in bed.  Skin: Skin is warm and dry. No rash noted.  Psychiatric: She has a normal mood and affect. Her behavior is normal.    ED Course  Procedures (including critical care time) Labs Review Labs Reviewed  CBC WITH DIFFERENTIAL/PLATELET - Abnormal; Notable for the following:    WBC 13.2 (*)    Hemoglobin 11.4 (*)    MCH 25.9 (*)    RDW 17.1 (*)    Neutro Abs 11.4 (*)    All other components within normal limits  COMPREHENSIVE METABOLIC PANEL - Abnormal; Notable for the following:    Sodium 133 (*)    Chloride 94 (*)    CO2 15 (*)    Glucose, Bld 488 (*)    BUN 33 (*)    Creatinine, Ser 1.39 (*)    Albumin 2.9 (*)    AST 13 (*)    ALT 12 (*)    Alkaline Phosphatase 395 (*)    Total Bilirubin 2.0 (*)    GFR calc non Af Amer 36 (*)    GFR calc Af Amer 42 (*)    Anion gap 24 (*)    All other components within normal limits  URINALYSIS, ROUTINE W REFLEX MICROSCOPIC (NOT AT [facility]) - Abnormal; Notable for the following:    Glucose, UA >1000 (*)    Hgb urine dipstick TRACE (*)    Bilirubin Urine MODERATE (*)    Ketones, ur >80 (*)    All other components within normal limits  URINE MICROSCOPIC-ADD ON - Abnormal; Notable for the following:    Squamous Epithelial / LPF 0-5 (*)    Bacteria, UA RARE (*)    Casts HYALINE CASTS (*)    All other components within normal limits  CULTURE, BLOOD (ROUTINE X 2)  CULTURE, BLOOD (ROUTINE X 2)  URINE CULTURE  I-STAT CG4 LACTIC ACID, ED    Imaging Review Dg Chest 2 View  11/23/2015  CLINICAL DATA:  Intermittent productive cough. Fever. Diabetes. 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 EXAM: CHEST  2 VIEW COMPARISON:  03/16/2015 and 11/08/2015 FINDINGS: Abnormal 5.6 by 4.3 cm density in  the right lung apex, questionable cavitation. 2.9 by 2.2 cm nodule in the left upper lobe. Vague right mid lung density better appreciated on  the lateral radiograph tracking around the minor fissure. The scattered small basilar pulmonary nodules shown on CT from 2 weeks ago are less conspicuous on conventional radiography, but there is a vague nodularity in the lungs especially on the lateral projection. Atherosclerotic aortic arch. Power injectable Port-A-Cath tip: Cavoatrial junction. IMPRESSION: 1. Masslike 5.6 by 4.3 cm right apical density, possible internal cavitation, could represent cavitary pneumonia or malignancy. 2. 2.9 cm left upper lobe pulmonary nodule. 3. Indistinct airspace opacity tracking along the minor fissure. 4. Scattered nodularity elsewhere in the lungs. 5. Atherosclerotic aortic arch. Electronically Signed   By: Van Clines M.D.   On: 11/23/2015 14:22   Ct Head Wo Contrast  11/23/2015  CLINICAL DATA:  Altered mental status. Assess for metastasis. History of hypertension, hyperlipidemia, diabetes, uterine cancer. EXAM: CT HEAD WITHOUT CONTRAST TECHNIQUE: Contiguous axial images were obtained from the base of the skull through the vertex without intravenous contrast. COMPARISON:  CT chest November 23, 2015 at 2130 hours and CT head May 24, 2006 FINDINGS: Small amount of residual intracranial contrast. The ventricles and sulci are normal for age. No intraparenchymal hemorrhage, mass effect nor midline shift. Patchy supratentorial white matter hypodensities are less than expected for patient's age and though non-specific suggest sequelae of chronic small vessel ischemic disease. No acute large vascular territory infarcts. No abnormal extra-axial fluid collections. Basal cisterns are patent. Moderate calcific atherosclerosis of the carotid siphons. No skull fracture. The included ocular globes and orbital contents are non-suspicious. Status post bilateral ocular lens implants.  Cerebellar tonsils at the foramen magnum. The mastoid aircells and included paranasal sinuses are well-aerated. IMPRESSION: Negative CT head for age. Electronically Signed   By: Elon Alas M.D.   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 EXAM: CT ANGIOGRAPHY CHEST CT ABDOMEN AND PELVIS WITH CONTRAST TECHNIQUE: Multidetector CT imaging of the chest was performed using the standard protocol during bolus administration of intravenous contrast. Multiplanar CT image reconstructions and MIPs were obtained to evaluate the vascular anatomy. Multidetector CT imaging of the abdomen and pelvis was performed using the standard protocol during bolus administration of intravenous contrast. CONTRAST:  2mL OMNIPAQUE IOHEXOL 350 MG/ML SOLN COMPARISON:  Abdominal CT dated 11/08/2015 FINDINGS: CTA CHEST FINDINGS There bilateral pulmonary masses and nodular densities compatible with metastatic disease. The largest mass measures approximately 4.4 x 4.1 cm in the right upper lobe. There is a mass in the left hilar and suprahilar region extending superiorly abutting the mediastinal pleura. There is diffuse interstitial and septal prominence. Multiple bilateral cavitary lesions noted measuring up to 11 mm in the left lower lobe. There is no pleural effusion or pneumothorax. The central airways are patent. Mild atherosclerotic calcification of the thoracic aorta. There is no aneurysm or dissection. There is no CT evidence of pulmonary embolism. There bilateral hilar adenopathy. Subcarinal adenopathy measuring 1.6 cm in short axis. There is no cardiomegaly or pericardial effusion. There is coronary vascular calcification. The esophagus is grossly unremarkable. Small subcentimeter left thyroid hypodense nodule. There is no axillary adenopathy. Right pectoral Port-A-Cath with tip at the cavoatrial junction. The  chest wall soft tissues appear unremarkable. Multiple 3-4 mm bilateral breast calcific foci noted. There are multiple sclerotic lesions throughout the spine compatible with metastatic disease. No acute fracture. CT ABDOMEN and PELVIS FINDINGS No intra-abdominal free air or fluid. There are innumerable hepatic hypodense lesions compatible with metastatic  disease. The 1.6 x 1.8 cm lesion in the right lobe of the liver measured 1.7 x 1.7 cm on the prior study. Cholecystectomy. The pancreas, spleen, and the adrenal glands appear unremarkable. There is moderate right and mild left renal atrophy. Similar to prior study. Multiple renal hypodense lesions are not well characterized but appears stable compared to the prior study and likely represent cysts. Ultrasound may provide better evaluation. There is no hydronephrosis on either side. The visualized ureters and urinary bladder appear unremarkable. Hysterectomy. There is scattered sigmoid diverticula without active inflammation. No evidence of bowel obstruction or inflammation. Stable appearance of the appendix without inflammatory changes. There is aortoiliac atherosclerotic disease. The aorta and IVC appear patent. No portal venous gas identified. Grossly stable left para-aortic adenopathy measuring approximately 12 mm in short axis. The grossly stable 9 x 6 cm cystic structure along the left pelvic wall compatible with previously described in the separate The abdominal wall soft tissues appear unremarkable. There are sclerotic lesions of the bones compatible with metastatic disease. No acute fracture. Review of the MIP images confirms the above findings. 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 No CT evidence of pulmonary embolism. Diffuse hepatic metastatic disease grossly similar to prior study. Left para-aortic lymphadenopathy similar to prior study. Grossly stable osseous metastatic disease. No significant change in  the metastatic disease in the abdomen appears since the prior study. Electronically Signed   By: Anner Crete M.D.   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 EXAM: CT ANGIOGRAPHY CHEST CT ABDOMEN AND PELVIS WITH CONTRAST TECHNIQUE: Multidetector CT imaging of the chest was performed using the standard protocol during bolus administration of intravenous contrast. Multiplanar CT image reconstructions and MIPs were obtained to evaluate the vascular anatomy. Multidetector CT imaging of the abdomen and pelvis was performed using the standard protocol during bolus administration of intravenous contrast. CONTRAST:  33mL OMNIPAQUE IOHEXOL 350 MG/ML SOLN COMPARISON:  Abdominal CT dated 11/08/2015 FINDINGS: CTA CHEST FINDINGS There bilateral pulmonary masses and nodular densities compatible with metastatic disease. The largest mass measures approximately 4.4 x 4.1 cm in the right upper lobe. There is a mass in the left hilar and suprahilar region extending superiorly abutting the mediastinal pleura. There is diffuse interstitial and septal prominence. Multiple bilateral cavitary lesions noted measuring up to 11 mm in the left lower lobe. There is no pleural effusion or pneumothorax. The central airways are patent. Mild atherosclerotic calcification of the thoracic aorta. There is no aneurysm or dissection. There is no CT evidence of pulmonary embolism. There bilateral hilar adenopathy. Subcarinal adenopathy measuring 1.6 cm in short axis. There is no cardiomegaly or pericardial effusion. There is coronary vascular calcification. The esophagus is grossly unremarkable. Small subcentimeter left thyroid hypodense nodule. There is no axillary adenopathy. Right pectoral Port-A-Cath with tip at the cavoatrial junction. The chest wall soft tissues appear unremarkable. Multiple 3-4 mm bilateral breast calcific  foci noted. There are multiple sclerotic lesions throughout the spine compatible with metastatic disease. No acute fracture. CT ABDOMEN and PELVIS FINDINGS No intra-abdominal free air or fluid. There are innumerable hepatic hypodense lesions compatible with metastatic disease. The 1.6 x 1.8 cm lesion in the right lobe of the liver measured 1.7 x 1.7 cm on the prior study. Cholecystectomy. The pancreas, spleen, and the adrenal glands appear unremarkable. There is moderate right and mild left renal atrophy. Similar to  prior study. Multiple renal hypodense lesions are not well characterized but appears stable compared to the prior study and likely represent cysts. Ultrasound may provide better evaluation. There is no hydronephrosis on either side. The visualized ureters and urinary bladder appear unremarkable. Hysterectomy. There is scattered sigmoid diverticula without active inflammation. No evidence of bowel obstruction or inflammation. Stable appearance of the appendix without inflammatory changes. There is aortoiliac atherosclerotic disease. The aorta and IVC appear patent. No portal venous gas identified. Grossly stable left para-aortic adenopathy measuring approximately 12 mm in short axis. The grossly stable 9 x 6 cm cystic structure along the left pelvic wall compatible with previously described in the separate The abdominal wall soft tissues appear unremarkable. There are sclerotic lesions of the bones compatible with metastatic disease. No acute fracture. Review of the MIP images confirms the above findings. 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 No CT evidence of pulmonary embolism. Diffuse hepatic metastatic disease grossly similar to prior study. Left para-aortic lymphadenopathy similar to prior study. Grossly stable osseous metastatic disease. No significant change in the metastatic disease in the abdomen appears since the prior study. Electronically  Signed   By: Anner Crete M.D.   On: 11/23/2015 22:20   I have personally reviewed and evaluated these images and lab results as part of my medical decision-making.   EKG Interpretation None      MDM   Final diagnoses:  Dehydration    Patient with marked weakness. Initial temperature 100.9. Improves after Tylenol in the emergency room. Leukocytosis at 13,000. Is not neutropenic. CO2 15 24. Was given IV fluids. Heart rate improves. CT head obtained with patient's weakness and known metastatic disease. No CNS metastases noted. Unfortunately CT of abdomen and chest are widely metastatic disease. The infiltrate noted on outpatient x-ray appears to be predominantly tumor bulk. Patient with elevation of alkaline phosphatase consistent with multiple liver metastases.  Patient remains markedly weak. I will discuss the case with hospitalist regarding disposition    Tanna Furry, MD 11/24/15 0003  Tanna Furry, MD 11/24/15 0025

## 2015-11-23 NOTE — ED Notes (Signed)
Pt received a phone call from Dr. Willey Blade telling her to come the the ED to be admitted due to pneumonia, per pt. Pt had an xray obtained earlier today.

## 2015-11-24 DIAGNOSIS — Y92009 Unspecified place in unspecified non-institutional (private) residence as the place of occurrence of the external cause: Secondary | ICD-10-CM | POA: Diagnosis not present

## 2015-11-24 DIAGNOSIS — Z90710 Acquired absence of both cervix and uterus: Secondary | ICD-10-CM | POA: Diagnosis not present

## 2015-11-24 DIAGNOSIS — M1990 Unspecified osteoarthritis, unspecified site: Secondary | ICD-10-CM | POA: Diagnosis present

## 2015-11-24 DIAGNOSIS — C7951 Secondary malignant neoplasm of bone: Secondary | ICD-10-CM | POA: Diagnosis present

## 2015-11-24 DIAGNOSIS — E785 Hyperlipidemia, unspecified: Secondary | ICD-10-CM | POA: Diagnosis present

## 2015-11-24 DIAGNOSIS — C787 Secondary malignant neoplasm of liver and intrahepatic bile duct: Secondary | ICD-10-CM | POA: Diagnosis present

## 2015-11-24 DIAGNOSIS — W19XXXA Unspecified fall, initial encounter: Secondary | ICD-10-CM | POA: Diagnosis present

## 2015-11-24 DIAGNOSIS — N183 Chronic kidney disease, stage 3 (moderate): Secondary | ICD-10-CM | POA: Diagnosis present

## 2015-11-24 DIAGNOSIS — R531 Weakness: Secondary | ICD-10-CM | POA: Diagnosis present

## 2015-11-24 DIAGNOSIS — C549 Malignant neoplasm of corpus uteri, unspecified: Secondary | ICD-10-CM

## 2015-11-24 DIAGNOSIS — E119 Type 2 diabetes mellitus without complications: Secondary | ICD-10-CM | POA: Diagnosis not present

## 2015-11-24 DIAGNOSIS — I10 Essential (primary) hypertension: Secondary | ICD-10-CM | POA: Diagnosis not present

## 2015-11-24 DIAGNOSIS — Z9049 Acquired absence of other specified parts of digestive tract: Secondary | ICD-10-CM | POA: Diagnosis not present

## 2015-11-24 DIAGNOSIS — E1122 Type 2 diabetes mellitus with diabetic chronic kidney disease: Secondary | ICD-10-CM | POA: Diagnosis present

## 2015-11-24 DIAGNOSIS — Z6825 Body mass index (BMI) 25.0-25.9, adult: Secondary | ICD-10-CM | POA: Diagnosis not present

## 2015-11-24 DIAGNOSIS — Z7984 Long term (current) use of oral hypoglycemic drugs: Secondary | ICD-10-CM | POA: Diagnosis not present

## 2015-11-24 DIAGNOSIS — I129 Hypertensive chronic kidney disease with stage 1 through stage 4 chronic kidney disease, or unspecified chronic kidney disease: Secondary | ICD-10-CM | POA: Diagnosis present

## 2015-11-24 DIAGNOSIS — E860 Dehydration: Secondary | ICD-10-CM | POA: Diagnosis not present

## 2015-11-24 DIAGNOSIS — N179 Acute kidney failure, unspecified: Secondary | ICD-10-CM | POA: Diagnosis not present

## 2015-11-24 DIAGNOSIS — C541 Malignant neoplasm of endometrium: Secondary | ICD-10-CM | POA: Diagnosis present

## 2015-11-24 DIAGNOSIS — Z809 Family history of malignant neoplasm, unspecified: Secondary | ICD-10-CM | POA: Diagnosis not present

## 2015-11-24 DIAGNOSIS — Z833 Family history of diabetes mellitus: Secondary | ICD-10-CM | POA: Diagnosis not present

## 2015-11-24 DIAGNOSIS — C7800 Secondary malignant neoplasm of unspecified lung: Secondary | ICD-10-CM | POA: Diagnosis not present

## 2015-11-24 DIAGNOSIS — Z8249 Family history of ischemic heart disease and other diseases of the circulatory system: Secondary | ICD-10-CM | POA: Diagnosis not present

## 2015-11-24 DIAGNOSIS — Z9221 Personal history of antineoplastic chemotherapy: Secondary | ICD-10-CM | POA: Diagnosis not present

## 2015-11-24 DIAGNOSIS — C779 Secondary and unspecified malignant neoplasm of lymph node, unspecified: Secondary | ICD-10-CM | POA: Diagnosis present

## 2015-11-24 DIAGNOSIS — C7801 Secondary malignant neoplasm of right lung: Secondary | ICD-10-CM | POA: Diagnosis present

## 2015-11-24 DIAGNOSIS — Z923 Personal history of irradiation: Secondary | ICD-10-CM | POA: Diagnosis not present

## 2015-11-24 DIAGNOSIS — C55 Malignant neoplasm of uterus, part unspecified: Secondary | ICD-10-CM | POA: Diagnosis not present

## 2015-11-24 DIAGNOSIS — K760 Fatty (change of) liver, not elsewhere classified: Secondary | ICD-10-CM | POA: Diagnosis present

## 2015-11-24 DIAGNOSIS — E440 Moderate protein-calorie malnutrition: Secondary | ICD-10-CM | POA: Diagnosis present

## 2015-11-24 LAB — COMPREHENSIVE METABOLIC PANEL
ALBUMIN: 2.5 g/dL — AB (ref 3.5–5.0)
ALK PHOS: 366 U/L — AB (ref 38–126)
ALT: 10 U/L — ABNORMAL LOW (ref 14–54)
ANION GAP: 20 — AB (ref 5–15)
AST: 12 U/L — ABNORMAL LOW (ref 15–41)
BUN: 26 mg/dL — ABNORMAL HIGH (ref 6–20)
CHLORIDE: 103 mmol/L (ref 101–111)
CO2: 15 mmol/L — AB (ref 22–32)
Calcium: 8.8 mg/dL — ABNORMAL LOW (ref 8.9–10.3)
Creatinine, Ser: 1.08 mg/dL — ABNORMAL HIGH (ref 0.44–1.00)
GFR calc Af Amer: 57 mL/min — ABNORMAL LOW (ref >60)
GFR calc non Af Amer: 49 mL/min — ABNORMAL LOW (ref >60)
GLUCOSE: 362 mg/dL — AB (ref 65–99)
POTASSIUM: 4.1 mmol/L (ref 3.5–5.1)
SODIUM: 138 mmol/L (ref 135–145)
Total Bilirubin: 1.7 mg/dL — ABNORMAL HIGH (ref 0.3–1.2)
Total Protein: 6.5 g/dL (ref 6.5–8.1)

## 2015-11-24 LAB — CBC
HEMATOCRIT: 36.3 % (ref 36.0–46.0)
HEMOGLOBIN: 11.5 g/dL — AB (ref 12.0–15.0)
MCH: 26 pg (ref 26.0–34.0)
MCHC: 31.7 g/dL (ref 30.0–36.0)
MCV: 81.9 fL (ref 78.0–100.0)
Platelets: 288 10*3/uL (ref 150–400)
RBC: 4.43 MIL/uL (ref 3.87–5.11)
RDW: 17 % — AB (ref 11.5–15.5)
WBC: 13 10*3/uL — ABNORMAL HIGH (ref 4.0–10.5)

## 2015-11-24 LAB — GLUCOSE, CAPILLARY
GLUCOSE-CAPILLARY: 309 mg/dL — AB (ref 65–99)
GLUCOSE-CAPILLARY: 276 mg/dL — AB (ref 65–99)
GLUCOSE-CAPILLARY: 261 mg/dL — AB (ref 65–99)
Glucose-Capillary: 407 mg/dL — ABNORMAL HIGH (ref 65–99)
Glucose-Capillary: 225 mg/dL — ABNORMAL HIGH (ref 65–99)

## 2015-11-24 MED ORDER — INSULIN ASPART 100 UNIT/ML ~~LOC~~ SOLN
0.0000 [IU] | Freq: Three times a day (TID) | SUBCUTANEOUS | Status: DC
  Administered 2015-11-24: 9 [IU] via SUBCUTANEOUS
  Administered 2015-11-24: 7 [IU] via SUBCUTANEOUS
  Administered 2015-11-24: 5 [IU] via SUBCUTANEOUS
  Administered 2015-11-24: 3 [IU] via SUBCUTANEOUS
  Administered 2015-11-25: 7 [IU] via SUBCUTANEOUS
  Administered 2015-11-25 – 2015-11-26 (×2): 5 [IU] via SUBCUTANEOUS
  Administered 2015-11-26: 3 [IU] via SUBCUTANEOUS

## 2015-11-24 MED ORDER — GABAPENTIN 300 MG PO CAPS
600.0000 mg | ORAL_CAPSULE | Freq: Three times a day (TID) | ORAL | Status: DC
  Administered 2015-11-24 – 2015-11-28 (×13): 600 mg via ORAL
  Filled 2015-11-24 (×5): qty 2
  Filled 2015-11-24: qty 6
  Filled 2015-11-24 (×7): qty 2

## 2015-11-24 MED ORDER — GABAPENTIN 300 MG PO CAPS
ORAL_CAPSULE | ORAL | Status: AC
  Filled 2015-11-24: qty 2

## 2015-11-24 MED ORDER — ENOXAPARIN SODIUM 40 MG/0.4ML ~~LOC~~ SOLN
40.0000 mg | SUBCUTANEOUS | Status: DC
  Administered 2015-11-25 – 2015-11-28 (×2): 40 mg via SUBCUTANEOUS
  Filled 2015-11-24 (×5): qty 0.4

## 2015-11-24 MED ORDER — ENOXAPARIN SODIUM 40 MG/0.4ML ~~LOC~~ SOLN
40.0000 mg | SUBCUTANEOUS | Status: DC
  Administered 2015-11-24: 40 mg via SUBCUTANEOUS

## 2015-11-24 MED ORDER — ALPRAZOLAM 1 MG PO TABS
1.0000 mg | ORAL_TABLET | Freq: Three times a day (TID) | ORAL | Status: DC | PRN
  Administered 2015-11-27: 1 mg via ORAL
  Filled 2015-11-24: qty 1

## 2015-11-24 MED ORDER — ONDANSETRON HCL 4 MG/2ML IJ SOLN: 4.0000 mg | Freq: Four times a day (QID) | INTRAMUSCULAR | Status: DC | PRN

## 2015-11-24 MED ORDER — MORPHINE SULFATE (PF) 2 MG/ML IV SOLN
2.0000 mg | INTRAVENOUS | Status: DC | PRN
  Administered 2015-11-26: 2 mg via INTRAVENOUS
  Filled 2015-11-24: qty 1

## 2015-11-24 MED ORDER — SODIUM CHLORIDE 0.9 % IV SOLN
INTRAVENOUS | Status: DC
Start: 2015-11-24 — End: 2015-11-28
  Administered 2015-11-24 – 2015-11-27 (×3): via INTRAVENOUS

## 2015-11-24 MED ORDER — INSULIN GLARGINE 100 UNIT/ML ~~LOC~~ SOLN
10.0000 [IU] | Freq: Every day | SUBCUTANEOUS | Status: DC
  Administered 2015-11-24 (×2): 10 [IU] via SUBCUTANEOUS
  Filled 2015-11-24 (×3): qty 0.1

## 2015-11-24 MED ORDER — ONDANSETRON HCL 4 MG PO TABS: 4.0000 mg | ORAL_TABLET | Freq: Four times a day (QID) | ORAL | Status: DC | PRN

## 2015-11-24 NOTE — ED Notes (Signed)
Dr lama at bedside,  

## 2015-11-24 NOTE — ED Notes (Signed)
FAMILY CONTACT INFORMATION Kristina Rodriguez Salih  [phone]. [name] (DAUGHTER) [phone]

## 2015-11-24 NOTE — H&P (Addendum)
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 Patient came to the ED for possible evaluation of pneumonia. Patient has history of recurrent and metastatic uterine cancer followed by cancer Center at [facility]. Patient at this time is unable to provide history as she is very somnolent after receiving morphine. As per patient family patient had cold and cough over the past few days, and developed progressive weakness over the last several days. This morning patient fell at home and was so weak that she could not get up. She was seen by primary care Dr. Willey Blade, outpatient chest x-ray was obtained and patient was told to come to the hospital for possible pneumonia. Patient is status post 2 cycles of carboplatin/Taxol. Radiotherapy completed 10/31/2014.  In the ED CT angiogram chest as well as CT abdomen was done, which showed extensive pulmonary metastatic disease with large bilateral pulmonary masses no evidence of pulmonary embolism. No infiltrate seen. No significant change in the metastatic disease in the abdomen. Diffuse hepatic metastatic disease grossly similar to prior study. Patient urine. Vancomycin and cefepime, urine analysis is clear, no pneumonia seen on CT of chest.    Allergies:   Allergies  Allergen Reactions  . Codeine Nausea Only      Past Medical History  Diagnosis Date  . Diabetes mellitus, type II ([place])   . Hypertension   . Hyperlipidemia   . Osteoarthritis   . Iron deficiency anemia     attributed to long-term treatment with a PPI  . Anxiety and depression   . Hepatic steatosis  2006    mild  . Cholelithiasis 2006    Acute and chronic cholecystitis; laparoscopic cholecystectomy in 2006  . 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 Chronic pain   . Collagen vascular disease ([place])   . Anxiety   . 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 Tonsillectomy    . Carpal tunnel release  1994    Right  . Cholecystectomy, laparoscopic  2006    Dr. Romona Curls; cholelithiasis  . Colonoscopy  2003    Najeeb Rehman;iron deficiency anemia; normal study; hiatal hernia, gastritis, Schatzki's ring on EGD  . Breast biopsy  10/2012    Left; fibroadenoma  . Cholecystectomy    . Tubal ligation  1970s    removed  . Hysteroscopy w/d&c N/A 06/06/2014    Procedure: DILATATION AND CURETTAGE /HYSTEROSCOPY;  Surgeon: Jonnie Kind, MD;  Location: [facility] ORS;  Service: Gynecology;  Laterality: N/A;  . Polypectomy N/A 06/06/2014    Procedure: POLYPECTOMY;  Surgeon: Jonnie Kind, MD;  Location: [facility] OR[facility];  Service: Gynecology;  Laterality: N/A;  . Abdominal hysterectomy  06/16/14    robotic hysterectomy, BSO, pelvic and para-aortic lymphadenectomy for uterine carcinosarcoma  . Colonoscopy N/A 06/13/2015    Procedure: COLONOSCOPY;  Surgeon: Rogene Houston, MD;  Location: [facility] SU[facility];  Service: Endoscopy;  Laterality: N/A;  145    Prior to Admission medications   Medication Sig Start Date End Date Taking? Authorizing Provider  ALPRAZolam Duanne Moron) 1 MG tablet Take 1 mg by mouth  3 (three) times daily as needed for anxiety.     Historical Provider, MD  atorvastatin (LIPITOR) 40 MG tablet Take 40 mg by mouth daily.  09/19/12   Historical Provider, MD  azithromycin (ZITHROMAX) 250 MG tablet  11/13/15   Historical Provider, MD  diphenoxylate-atropine (LOMOTIL) 2.5-0.025 MG tablet  10/24/15   Historical Provider, MD  furosemide (LASIX) 40 MG tablet Take one tablet daily in the morning or as directed for fluid  retention. 10/25/14   Farrel Gobble, MD  gabapentin (NEURONTIN) 600 MG tablet Take 600 mg by mouth 3 (three) times daily.  02/12/15   Historical Provider, MD  glipiZIDE (GLUCOTROL) 5 MG tablet Take 5 mg by mouth 2 (two) times daily before a meal.     Historical Provider, MD  HYDROcodone-acetaminophen (NORCO/VICODIN) 5-325 MG per tablet Take 1 tablet by mouth every 6 (six) hours as needed for moderate pain.    Historical Provider, MD  hyoscyamine (LEVSIN SL) 0.125 MG SL tablet Place 1 tablet (0.125 mg total) under the tongue 3 (three) times daily as needed. Patient not taking: Reported on 10/31/2015 06/13/15   Rogene Houston, MD  lidocaine-prilocaine (EMLA) cream Apply 1 application topically as needed (when port is accessed.).  07/05/14   Historical Provider, MD  losartan (COZAAR) 100 MG tablet Take 100 mg by mouth every morning.     Historical Provider, MD  Menthol-Methyl Salicylate (MUSCLE RUB) 10-15 % CREA Apply 1 application topically as needed for muscle pain (apply to hip).    Historical Provider, MD  metFORMIN (GLUCOPHAGE) 1000 MG tablet Take 1,000 mg by mouth 2 (two) times daily with a meal.    Historical Provider, MD  omeprazole (PRILOSEC) 40 MG capsule Take 40 mg by mouth daily.    Historical Provider, MD  pioglitazone (ACTOS) 15 MG tablet Take 15 mg by mouth daily.    Historical Provider, MD  potassium chloride SA (K-DUR,KLOR-CON) 20 MEQ tablet Take 1 tablet (20 mEq total) by mouth 2 (two) times daily. Patient taking differently: Take 20 mEq by mouth daily as needed (when taking fluid pill).  10/25/14   Farrel Gobble, MD  Wheat Dextrin (BENEFIBER DRINK MIX) PACK Take 4 g by mouth at bedtime. 06/13/15   Rogene Houston, MD    Social History:  reports that she has never smoked. She has never used smokeless tobacco. She reports that she does not drink alcohol or use illicit drugs.  Family History  Problem Relation Age of Onset  . Cancer Mother 101    pancreas and liver  . Diabetes  Brother   . Congestive Heart Failure Father   . Hemochromatosis Brother   . Cancer Maternal Aunt   . Cancer Maternal Raenette Rover Weights   11/23/15 1849  Weight: 71.668 kg (158 lb)    All the positives are listed in BOLD  Review of Systems:  Unable to obtain as patient very somnolent and lethargic   Physical Exam: Blood pressure 164/75, pulse 120, temperature 98.1 F (36.7 C), temperature source Oral, resp. rate 24, height 5\' 6"  (1.676 m), weight 71.668 kg (158 lb), SpO2 98 %. Constitutional:   Patient is lethargic at this time Head: Normocephalic and atraumatic Mouth: Mucus membranes moist Cardiovascular: RRR, S1 normal, S2 normal Pulmonary/Chest: CTAB, no wheezes, rales, or rhonchi Abdominal: Soft. Non-tender, non-distended, bowel sounds are normal, no masses, organomegaly, or guarding present.  Neurological: somnolent, moving all four extremities Extremities : No Cyanosis, Clubbing or Edema  Labs on Admission:  Basic Metabolic Panel:  Recent Labs Lab 11/23/15 1915  NA 133*  K 4.5  CL 94*  CO2 15*  GLUCOSE 488*  BUN 33*  CREATININE 1.39*  CALCIUM 9.1   Liver Function Tests:  Recent Labs Lab 11/23/15 1915  AST 13*  ALT 12*  ALKPHOS 395*  BILITOT 2.0*  PROT 7.3  ALBUMIN 2.9*   No results for input(s): LIPASE, AMYLASE in the last 168 hours. No results for input(s): AMMONIA in the last 168 hours. CBC:  Recent Labs Lab 11/23/15 1915  WBC 13.2*  NEUTROABS 11.4*  HGB 11.4*  HCT 36.2  MCV 82.1  PLT 295    Radiological Exams on Admission: Dg Chest 2 View  11/23/2015  CLINICAL DATA:  Intermittent productive cough. Fever. Diabetes. 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 EXAM: CHEST  2 VIEW COMPARISON:  03/16/2015 and 11/08/2015 FINDINGS: Abnormal 5.6 by 4.3 cm density in the right lung apex, questionable cavitation. 2.9 by 2.2 cm nodule in the left upper lobe. Vague right mid lung density better appreciated on the lateral  radiograph tracking around the minor fissure. The scattered small basilar pulmonary nodules shown on CT from 2 weeks ago are less conspicuous on conventional radiography, but there is a vague nodularity in the lungs especially on the lateral projection. Atherosclerotic aortic arch. Power injectable Port-A-Cath tip: Cavoatrial junction. IMPRESSION: 1. Masslike 5.6 by 4.3 cm right apical density, possible internal cavitation, could represent cavitary pneumonia or malignancy. 2. 2.9 cm left upper lobe pulmonary nodule. 3. Indistinct airspace opacity tracking along the minor fissure. 4. Scattered nodularity elsewhere in the lungs. 5. Atherosclerotic aortic arch. Electronically Signed   By: Van Clines M.D.   On: 11/23/2015 14:22   Ct Head Wo Contrast  11/23/2015  CLINICAL DATA:  Altered mental status. Assess for metastasis. History of hypertension, hyperlipidemia, diabetes, uterine cancer. EXAM: CT HEAD WITHOUT CONTRAST TECHNIQUE: Contiguous axial images were obtained from the base of the skull through the vertex without intravenous contrast. COMPARISON:  CT chest November 23, 2015 at 2130 hours and CT head May 24, 2006 FINDINGS: Small amount of residual intracranial contrast. The ventricles and sulci are normal for age. No intraparenchymal hemorrhage, mass effect nor midline shift. Patchy supratentorial white matter hypodensities are less than expected for patient's age and though non-specific suggest sequelae of chronic small vessel ischemic disease. No acute large vascular territory infarcts. No abnormal extra-axial fluid collections. Basal cisterns are patent. Moderate calcific atherosclerosis of the carotid siphons. No skull fracture. The included ocular globes and orbital contents are non-suspicious. Status post bilateral ocular lens implants. Cerebellar tonsils at the foramen magnum. The mastoid aircells and included paranasal sinuses are well-aerated. IMPRESSION: Negative CT head for age.  Electronically Signed   By: Elon Alas M.D.   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 EXAM: CT ANGIOGRAPHY CHEST CT ABDOMEN AND PELVIS WITH CONTRAST TECHNIQUE: Multidetector CT imaging of the chest was performed using the standard protocol during bolus administration of intravenous contrast. Multiplanar CT image reconstructions and MIPs were obtained to evaluate the vascular anatomy. Multidetector CT imaging of the abdomen and pelvis was performed using the standard protocol during bolus administration of intravenous contrast. CONTRAST:  42mL OMNIPAQUE IOHEXOL 350 MG/ML SOLN COMPARISON:  Abdominal CT dated 11/08/2015 FINDINGS: CTA CHEST FINDINGS There bilateral pulmonary masses and nodular densities compatible with metastatic disease. The  largest mass measures approximately 4.4 x 4.1 cm in the right upper lobe. There is a mass in the left hilar and suprahilar region extending superiorly abutting the mediastinal pleura. There is diffuse interstitial and septal prominence. Multiple bilateral cavitary lesions noted measuring up to 11 mm in the left lower lobe. There is no pleural effusion or pneumothorax. The central airways are patent. Mild atherosclerotic calcification of the thoracic aorta. There is no aneurysm or dissection. There is no CT evidence of pulmonary embolism. There bilateral hilar adenopathy. Subcarinal adenopathy measuring 1.6 cm in short axis. There is no cardiomegaly or pericardial effusion. There is coronary vascular calcification. The esophagus is grossly unremarkable. Small subcentimeter left thyroid hypodense nodule. There is no axillary adenopathy. Right pectoral Port-A-Cath with tip at the cavoatrial junction. The chest wall soft tissues appear unremarkable. Multiple 3-4 mm bilateral breast calcific foci noted. There are multiple sclerotic lesions throughout the  spine compatible with metastatic disease. No acute fracture. CT ABDOMEN and PELVIS FINDINGS No intra-abdominal free air or fluid. There are innumerable hepatic hypodense lesions compatible with metastatic disease. The 1.6 x 1.8 cm lesion in the right lobe of the liver measured 1.7 x 1.7 cm on the prior study. Cholecystectomy. The pancreas, spleen, and the adrenal glands appear unremarkable. There is moderate right and mild left renal atrophy. Similar to prior study. Multiple renal hypodense lesions are not well characterized but appears stable compared to the prior study and likely represent cysts. Ultrasound may provide better evaluation. There is no hydronephrosis on either side. The visualized ureters and urinary bladder appear unremarkable. Hysterectomy. There is scattered sigmoid diverticula without active inflammation. No evidence of bowel obstruction or inflammation. Stable appearance of the appendix without inflammatory changes. There is aortoiliac atherosclerotic disease. The aorta and IVC appear patent. No portal venous gas identified. Grossly stable left para-aortic adenopathy measuring approximately 12 mm in short axis. The grossly stable 9 x 6 cm cystic structure along the left pelvic wall compatible with previously described in the separate The abdominal wall soft tissues appear unremarkable. There are sclerotic lesions of the bones compatible with metastatic disease. No acute fracture. Review of the MIP images confirms the above findings. 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 No CT evidence of pulmonary embolism. Diffuse hepatic metastatic disease grossly similar to prior study. Left para-aortic lymphadenopathy similar to prior study. Grossly stable osseous metastatic disease. No significant change in the metastatic disease in the abdomen appears since the prior study. Electronically Signed   By: Anner Crete M.D.   On: 11/23/2015 22:20   Ct  Abdomen Pelvis W Contrast  11/23/2015  CLINICAL DATA:  75 year old female with metastatic uterine carcinoma and abnormal chest x-ray. EXAM: CT ANGIOGRAPHY CHEST CT ABDOMEN AND PELVIS WITH CONTRAST TECHNIQUE: Multidetector CT imaging of the chest was performed using the standard protocol during bolus administration of intravenous contrast. Multiplanar CT image reconstructions and MIPs were obtained to evaluate the vascular anatomy. Multidetector CT imaging of the abdomen and pelvis was performed using the standard protocol during bolus administration of intravenous contrast. CONTRAST:  25mL OMNIPAQUE IOHEXOL 350 MG/ML SOLN COMPARISON:  Abdominal CT dated 11/08/2015 FINDINGS: CTA CHEST FINDINGS There bilateral pulmonary masses and nodular densities compatible with metastatic disease. The largest mass measures approximately 4.4 x 4.1 cm in the right upper lobe. There is a mass in the left hilar and suprahilar region extending superiorly abutting the mediastinal pleura. There is diffuse interstitial and septal prominence. Multiple bilateral cavitary lesions noted measuring up  to 11 mm in the left lower lobe. There is no pleural effusion or pneumothorax. The central airways are patent. Mild atherosclerotic calcification of the thoracic aorta. There is no aneurysm or dissection. There is no CT evidence of pulmonary embolism. There bilateral hilar adenopathy. Subcarinal adenopathy measuring 1.6 cm in short axis. There is no cardiomegaly or pericardial effusion. There is coronary vascular calcification. The esophagus is grossly unremarkable. Small subcentimeter left thyroid hypodense nodule. There is no axillary adenopathy. Right pectoral Port-A-Cath with tip at the cavoatrial junction. The chest wall soft tissues appear unremarkable. Multiple 3-4 mm bilateral breast calcific foci noted. There are multiple sclerotic lesions throughout the spine compatible with metastatic disease. No acute fracture. CT ABDOMEN and PELVIS  FINDINGS No intra-abdominal free air or fluid. There are innumerable hepatic hypodense lesions compatible with metastatic disease. The 1.6 x 1.8 cm lesion in the right lobe of the liver measured 1.7 x 1.7 cm on the prior study. Cholecystectomy. The pancreas, spleen, and the adrenal glands appear unremarkable. There is moderate right and mild left renal atrophy. Similar to prior study. Multiple renal hypodense lesions are not well characterized but appears stable compared to the prior study and likely represent cysts. Ultrasound may provide better evaluation. There is no hydronephrosis on either side. The visualized ureters and urinary bladder appear unremarkable. Hysterectomy. There is scattered sigmoid diverticula without active inflammation. No evidence of bowel obstruction or inflammation. Stable appearance of the appendix without inflammatory changes. There is aortoiliac atherosclerotic disease. The aorta and IVC appear patent. No portal venous gas identified. Grossly stable left para-aortic adenopathy measuring approximately 12 mm in short axis. The grossly stable 9 x 6 cm cystic structure along the left pelvic wall compatible with previously described in the separate The abdominal wall soft tissues appear unremarkable. There are sclerotic lesions of the bones compatible with metastatic disease. No acute fracture. Review of the MIP images confirms the above findings. 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 No CT evidence of pulmonary embolism. Diffuse hepatic metastatic disease grossly similar to prior study. Left para-aortic lymphadenopathy similar to prior study. Grossly stable osseous metastatic disease. No significant change in the metastatic disease in the abdomen appears since the prior study. Electronically Signed   By: Anner Crete M.D.   On: 11/23/2015 22:20       Assessment/Plan Active Problems:   Diabetes mellitus, type II ([place])    Hypertension   Uterine endometrial cancer, sarcoma ([facility])   Dehydration   AKI (acute kidney injury) ([place])  Dehydration/AK I Admit the patient, start gentle IV hydration. Hold Lasix and lisinopril Check BMP in a.m.  Lethargy/weakness Likely from dehydration, metastatic cancer Will give gentle IV hydration CT head negative for any acute abnormality Consider palliative care consultation in a.m.  Metastatic uterine cancer Patient now has bilateral permitted metastasis, with liver and abdominal metastasis Consider palliative care consultation in a.m.  Diabetes mellitus Hold oral hypoglycemic agents. Start Lantus 10 units subcutaneous daily at bedtime, Start sliding scale insulin with NovoLog.  DVT prophylaxis Lovenox  Code status: Full code  Family discussion: Admission, patients condition and plan of care including tests being ordered have been discussed with the patient and *her husband and daughter at bedside who indicate understanding and agree with the plan and Code Status.   Time Spent on Admission: 55 min  [facility] Pager: [phone] 11/24/2015, 12:39 AM  If 7PM-7AM, please contact night-coverage  [email or web address]  Password [email or web address]

## 2015-11-24 NOTE — Progress Notes (Signed)
Subjective: Kristina Rodriguez was admitted by the hospitalist last night following additional evaluation in the emergency room. She has had progressive weakness over the past 2 weeks. She's had a recent URI. Chest x-ray was suggestive of pneumonia versus tumor yesterday. She underwent CT scan of the chest which revealed multiple metastatic lesions in the chest. She also has metastatic disease to the liver, spine and lymph nodes. There is no evidence of pneumonia. She was dehydrated on admission and has been treated with IV fluids. She denies pain at the present time. She is afebrile. Tachycardia has improved.  Objective: Vital signs in last 24 hours: Filed Vitals:   11/24/15 0030 11/24/15 0059 11/24/15 0128 11/24/15 0546  BP: 176/84 176/80 167/65 146/61  Pulse: 118 117 117 114  Temp:  97.8 F (36.6 C) 98 F (36.7 C) 97.7 F (36.5 C)  TempSrc:  Oral Oral Oral  Resp:  20  20  Height:      Weight:   146 lb 9.7 oz (66.5 kg)   SpO2: 98% 98% 95% 97%   Weight change:   Intake/Output Summary (Last 24 hours) at 11/24/15 1054 Last data filed at 11/23/15 2105  Gross per 24 hour  Intake      0 ml  Output    700 ml  Net   -700 ml    Physical Exam: Lethargic. Weak appearing. Pharynx dry. No scleral icterus. Lungs clear. Heart tachycardic with no murmurs. Abdomen mildly tender in the lower abdomen. No palpable hepatosplenomegaly. Extremities reveal no edema. Neuro grossly intact.  Head CT reveals no metastatic disease.  Lab Results:    Results for orders placed or performed during the hospital encounter of 11/23/15 (from the past 24 hour(s))  CBC with Differential/Platelet     Status: Abnormal   Collection Time: 11/23/15  7:15 PM  Result Value Ref Range   WBC 13.2 (H) 4.0 - 10.5 K/uL   RBC 4.41 3.87 - 5.11 MIL/uL   Hemoglobin 11.4 (L) 12.0 - 15.0 g/dL   HCT 36.2 36.0 - 46.0 %   MCV 82.1 78.0 - 100.0 fL   MCH 25.9 (L) 26.0 - 34.0 pg   MCHC 31.5 30.0 - 36.0 g/dL   RDW 17.1 (H) 11.5 - 15.5 %    Platelets 295 150 - 400 K/uL   Neutrophils Relative % 86 %   Neutro Abs 11.4 (H) 1.7 - 7.7 K/uL   Lymphocytes Relative 6 %   Lymphs Abs 0.8 0.7 - 4.0 K/uL   Monocytes Relative 7 %   Monocytes Absolute 0.9 0.1 - 1.0 K/uL   Eosinophils Relative 0 %   Eosinophils Absolute 0.0 0.0 - 0.7 K/uL   Basophils Relative 0 %   Basophils Absolute 0.0 0.0 - 0.1 K/uL  Comprehensive metabolic panel     Status: Abnormal   Collection Time: 11/23/15  7:15 PM  Result Value Ref Range   Sodium 133 (L) 135 - 145 mmol/L   Potassium 4.5 3.5 - 5.1 mmol/L   Chloride 94 (L) 101 - 111 mmol/L   CO2 15 (L) 22 - 32 mmol/L   Glucose, Bld 488 (H) 65 - 99 mg/dL   BUN 33 (H) 6 - 20 mg/dL   Creatinine, Ser 1.39 (H) 0.44 - 1.00 mg/dL   Calcium 9.1 8.9 - 10.3 mg/dL   Total Protein 7.3 6.5 - 8.1 g/dL   Albumin 2.9 (L) 3.5 - 5.0 g/dL   AST 13 (L) 15 - 41 U/L   ALT 12 (L) 14 -  54 U/L   Alkaline Phosphatase 395 (H) 38 - 126 U/L   Total Bilirubin 2.0 (H) 0.3 - 1.2 mg/dL   GFR calc non Af Amer 36 (L) >60 mL/min   GFR calc Af Amer 42 (L) >60 mL/min   Anion gap 24 (H) 5 - 15  I-Stat CG4 Lactic Acid, ED     Status: None   Collection Time: 11/23/15  7:29 PM  Result Value Ref Range   Lactic Acid, Venous 1.39 0.5 - 2.0 mmol/L  Culture, blood (Routine X 2) w Reflex to ID Panel     Status: None (Preliminary result)   Collection Time: 11/23/15  7:55 PM  Result Value Ref Range   Specimen Description BLOOD RIGHT HAND    Special Requests BOTTLES DRAWN AEROBIC ONLY 4CC ONLY    Culture NO GROWTH < 12 HOURS    Report Status PENDING   Culture, blood (Routine X 2) w Reflex to ID Panel     Status: None (Preliminary result)   Collection Time: 11/23/15  8:01 PM  Result Value Ref Range   Specimen Description BLOOD LEFT HAND    Special Requests BOTTLES DRAWN AEROBIC ONLY 4CC ONLY    Culture NO GROWTH < 12 HOURS    Report Status PENDING   Urinalysis, Routine w reflex microscopic (not at [facility])     Status: Abnormal   Collection Time:  11/23/15  9:30 PM  Result Value Ref Range   Color, Urine YELLOW YELLOW   APPearance CLEAR CLEAR   Specific Gravity, Urine 1.015 1.005 - 1.030   pH 5.5 5.0 - 8.0   Glucose, UA >1000 (A) NEGATIVE mg/dL   Hgb urine dipstick TRACE (A) NEGATIVE   Bilirubin Urine MODERATE (A) NEGATIVE   Ketones, ur >80 (A) NEGATIVE mg/dL   Protein, ur NEGATIVE NEGATIVE mg/dL   Nitrite NEGATIVE NEGATIVE   Leukocytes, UA NEGATIVE NEGATIVE  Urine microscopic-add on     Status: Abnormal   Collection Time: 11/23/15  9:30 PM  Result Value Ref Range   Squamous Epithelial / LPF 0-5 (A) NONE SEEN   WBC, UA NONE SEEN 0 - 5 WBC/hpf   RBC / HPF 0-5 0 - 5 RBC/hpf   Bacteria, UA RARE (A) NONE SEEN   Casts HYALINE CASTS (A) NEGATIVE  Glucose, capillary     Status: Abnormal   Collection Time: 11/24/15  1:36 AM  Result Value Ref Range   Glucose-Capillary 407 (H) 65 - 99 mg/dL   Comment 1 Notify RN    Comment 2 Document in Chart   CBC     Status: Abnormal   Collection Time: 11/24/15  6:12 AM  Result Value Ref Range   WBC 13.0 (H) 4.0 - 10.5 K/uL   RBC 4.43 3.87 - 5.11 MIL/uL   Hemoglobin 11.5 (L) 12.0 - 15.0 g/dL   HCT 36.3 36.0 - 46.0 %   MCV 81.9 78.0 - 100.0 fL   MCH 26.0 26.0 - 34.0 pg   MCHC 31.7 30.0 - 36.0 g/dL   RDW 17.0 (H) 11.5 - 15.5 %   Platelets 288 150 - 400 K/uL  Comprehensive metabolic panel     Status: Abnormal   Collection Time: 11/24/15  6:12 AM  Result Value Ref Range   Sodium 138 135 - 145 mmol/L   Potassium 4.1 3.5 - 5.1 mmol/L   Chloride 103 101 - 111 mmol/L   CO2 15 (L) 22 - 32 mmol/L   Glucose, Bld 362 (H) 65 - 99 mg/dL  BUN 26 (H) 6 - 20 mg/dL   Creatinine, Ser 1.08 (H) 0.44 - 1.00 mg/dL   Calcium 8.8 (L) 8.9 - 10.3 mg/dL   Total Protein 6.5 6.5 - 8.1 g/dL   Albumin 2.5 (L) 3.5 - 5.0 g/dL   AST 12 (L) 15 - 41 U/L   ALT 10 (L) 14 - 54 U/L   Alkaline Phosphatase 366 (H) 38 - 126 U/L   Total Bilirubin 1.7 (H) 0.3 - 1.2 mg/dL   GFR calc non Af Amer 49 (L) >60 mL/min   GFR calc  Af Amer 57 (L) >60 mL/min   Anion gap 20 (H) 5 - 15  Glucose, capillary     Status: Abnormal   Collection Time: 11/24/15  8:07 AM  Result Value Ref Range   Glucose-Capillary 309 (H) 65 - 99 mg/dL   Comment 1 Notify RN      ABGS No results for input(s): PHART, PO2ART, TCO2, HCO3 in the last 72 hours.  Invalid input(s): PCO2 CULTURES Recent Results (from the past 240 hour(s))  Culture, blood (Routine X 2) w Reflex to ID Panel     Status: None (Preliminary result)   Collection Time: 11/23/15  7:55 PM  Result Value Ref Range Status   Specimen Description BLOOD RIGHT HAND  Final   Special Requests BOTTLES DRAWN AEROBIC ONLY 4CC ONLY  Final   Culture NO GROWTH < 12 HOURS  Final   Report Status PENDING  Incomplete  Culture, blood (Routine X 2) w Reflex to ID Panel     Status: None (Preliminary result)   Collection Time: 11/23/15  8:01 PM  Result Value Ref Range Status   Specimen Description BLOOD LEFT HAND  Final   Special Requests BOTTLES DRAWN AEROBIC ONLY 4CC ONLY  Final   Culture NO GROWTH < 12 HOURS  Final   Report Status PENDING  Incomplete   Studies/Results: Dg Chest 2 View  11/23/2015  CLINICAL DATA:  Intermittent productive cough. Fever. Diabetes. 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 EXAM: CHEST  2 VIEW COMPARISON:  03/16/2015 and 11/08/2015 FINDINGS: Abnormal 5.6 by 4.3 cm density in the right lung apex, questionable cavitation. 2.9 by 2.2 cm nodule in the left upper lobe. Vague right mid lung density better appreciated on the lateral radiograph tracking around the minor fissure. The scattered small basilar pulmonary nodules shown on CT from 2 weeks ago are less conspicuous on conventional radiography, but there is a vague nodularity in the lungs especially on the lateral projection. Atherosclerotic aortic arch. Power injectable Port-A-Cath tip: Cavoatrial junction. IMPRESSION: 1. Masslike 5.6 by 4.3 cm right apical density, possible internal cavitation,  could represent cavitary pneumonia or malignancy. 2. 2.9 cm left upper lobe pulmonary nodule. 3. Indistinct airspace opacity tracking along the minor fissure. 4. Scattered nodularity elsewhere in the lungs. 5. Atherosclerotic aortic arch. Electronically Signed   By: Van Clines M.D.   On: 11/23/2015 14:22   Ct Head Wo Contrast  11/23/2015  CLINICAL DATA:  Altered mental status. Assess for metastasis. History of hypertension, hyperlipidemia, diabetes, uterine cancer. EXAM: CT HEAD WITHOUT CONTRAST TECHNIQUE: Contiguous axial images were obtained from the base of the skull through the vertex without intravenous contrast. COMPARISON:  CT chest November 23, 2015 at 2130 hours and CT head May 24, 2006 FINDINGS: Small amount of residual intracranial contrast. The ventricles and sulci are normal for age. No intraparenchymal hemorrhage, mass effect nor midline shift. Patchy supratentorial white matter hypodensities are less than  expected for patient's age and though non-specific suggest sequelae of chronic small vessel ischemic disease. No acute large vascular territory infarcts. No abnormal extra-axial fluid collections. Basal cisterns are patent. Moderate calcific atherosclerosis of the carotid siphons. No skull fracture. The included ocular globes and orbital contents are non-suspicious. Status post bilateral ocular lens implants. Cerebellar tonsils at the foramen magnum. The mastoid aircells and included paranasal sinuses are well-aerated. IMPRESSION: Negative CT head for age. Electronically Signed   By: Elon Alas M.D.   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 EXAM: CT ANGIOGRAPHY CHEST CT ABDOMEN AND PELVIS WITH CONTRAST TECHNIQUE: Multidetector CT imaging of the chest was performed using the standard protocol during bolus administration of intravenous contrast. Multiplanar  CT image reconstructions and MIPs were obtained to evaluate the vascular anatomy. Multidetector CT imaging of the abdomen and pelvis was performed using the standard protocol during bolus administration of intravenous contrast. CONTRAST:  65mL OMNIPAQUE IOHEXOL 350 MG/ML SOLN COMPARISON:  Abdominal CT dated 11/08/2015 FINDINGS: CTA CHEST FINDINGS There bilateral pulmonary masses and nodular densities compatible with metastatic disease. The largest mass measures approximately 4.4 x 4.1 cm in the right upper lobe. There is a mass in the left hilar and suprahilar region extending superiorly abutting the mediastinal pleura. There is diffuse interstitial and septal prominence. Multiple bilateral cavitary lesions noted measuring up to 11 mm in the left lower lobe. There is no pleural effusion or pneumothorax. The central airways are patent. Mild atherosclerotic calcification of the thoracic aorta. There is no aneurysm or dissection. There is no CT evidence of pulmonary embolism. There bilateral hilar adenopathy. Subcarinal adenopathy measuring 1.6 cm in short axis. There is no cardiomegaly or pericardial effusion. There is coronary vascular calcification. The esophagus is grossly unremarkable. Small subcentimeter left thyroid hypodense nodule. There is no axillary adenopathy. Right pectoral Port-A-Cath with tip at the cavoatrial junction. The chest wall soft tissues appear unremarkable. Multiple 3-4 mm bilateral breast calcific foci noted. There are multiple sclerotic lesions throughout the spine compatible with metastatic disease. No acute fracture. CT ABDOMEN and PELVIS FINDINGS No intra-abdominal free air or fluid. There are innumerable hepatic hypodense lesions compatible with metastatic disease. The 1.6 x 1.8 cm lesion in the right lobe of the liver measured 1.7 x 1.7 cm on the prior study. Cholecystectomy. The pancreas, spleen, and the adrenal glands appear unremarkable. There is moderate right and mild left renal  atrophy. Similar to prior study. Multiple renal hypodense lesions are not well characterized but appears stable compared to the prior study and likely represent cysts. Ultrasound may provide better evaluation. There is no hydronephrosis on either side. The visualized ureters and urinary bladder appear unremarkable. Hysterectomy. There is scattered sigmoid diverticula without active inflammation. No evidence of bowel obstruction or inflammation. Stable appearance of the appendix without inflammatory changes. There is aortoiliac atherosclerotic disease. The aorta and IVC appear patent. No portal venous gas identified. Grossly stable left para-aortic adenopathy measuring approximately 12 mm in short axis. The grossly stable 9 x 6 cm cystic structure along the left pelvic wall compatible with previously described in the separate The abdominal wall soft tissues appear unremarkable. There are sclerotic lesions of the bones compatible with metastatic disease. No acute fracture. Review of the MIP images confirms the above findings. 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 No CT evidence of pulmonary embolism. Diffuse  hepatic metastatic disease grossly similar to prior study. Left para-aortic lymphadenopathy similar to prior study. Grossly stable osseous metastatic disease. No significant change in the metastatic disease in the abdomen appears since the prior study. Electronically Signed   By: Anner Crete M.D.   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 EXAM: CT ANGIOGRAPHY CHEST CT ABDOMEN AND PELVIS WITH CONTRAST TECHNIQUE: Multidetector CT imaging of the chest was performed using the standard protocol during bolus administration of intravenous contrast. Multiplanar CT image reconstructions and MIPs were obtained to evaluate the vascular anatomy.  Multidetector CT imaging of the abdomen and pelvis was performed using the standard protocol during bolus administration of intravenous contrast. CONTRAST:  44mL OMNIPAQUE IOHEXOL 350 MG/ML SOLN COMPARISON:  Abdominal CT dated 11/08/2015 FINDINGS: CTA CHEST FINDINGS There bilateral pulmonary masses and nodular densities compatible with metastatic disease. The largest mass measures approximately 4.4 x 4.1 cm in the right upper lobe. There is a mass in the left hilar and suprahilar region extending superiorly abutting the mediastinal pleura. There is diffuse interstitial and septal prominence. Multiple bilateral cavitary lesions noted measuring up to 11 mm in the left lower lobe. There is no pleural effusion or pneumothorax. The central airways are patent. Mild atherosclerotic calcification of the thoracic aorta. There is no aneurysm or dissection. There is no CT evidence of pulmonary embolism. There bilateral hilar adenopathy. Subcarinal adenopathy measuring 1.6 cm in short axis. There is no cardiomegaly or pericardial effusion. There is coronary vascular calcification. The esophagus is grossly unremarkable. Small subcentimeter left thyroid hypodense nodule. There is no axillary adenopathy. Right pectoral Port-A-Cath with tip at the cavoatrial junction. The chest wall soft tissues appear unremarkable. Multiple 3-4 mm bilateral breast calcific foci noted. There are multiple sclerotic lesions throughout the spine compatible with metastatic disease. No acute fracture. CT ABDOMEN and PELVIS FINDINGS No intra-abdominal free air or fluid. There are innumerable hepatic hypodense lesions compatible with metastatic disease. The 1.6 x 1.8 cm lesion in the right lobe of the liver measured 1.7 x 1.7 cm on the prior study. Cholecystectomy. The pancreas, spleen, and the adrenal glands appear unremarkable. There is moderate right and mild left renal atrophy. Similar to prior study. Multiple renal hypodense lesions are not well  characterized but appears stable compared to the prior study and likely represent cysts. Ultrasound may provide better evaluation. There is no hydronephrosis on either side. The visualized ureters and urinary bladder appear unremarkable. Hysterectomy. There is scattered sigmoid diverticula without active inflammation. No evidence of bowel obstruction or inflammation. Stable appearance of the appendix without inflammatory changes. There is aortoiliac atherosclerotic disease. The aorta and IVC appear patent. No portal venous gas identified. Grossly stable left para-aortic adenopathy measuring approximately 12 mm in short axis. The grossly stable 9 x 6 cm cystic structure along the left pelvic wall compatible with previously described in the separate The abdominal wall soft tissues appear unremarkable. There are sclerotic lesions of the bones compatible with metastatic disease. No acute fracture. Review of the MIP images confirms the above findings. 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 No CT evidence of pulmonary embolism. Diffuse hepatic metastatic disease grossly similar to prior study. Left para-aortic lymphadenopathy similar to prior study. Grossly stable osseous metastatic disease. No significant change in the metastatic disease in the abdomen appears since the prior study. Electronically Signed   By: Laren Everts.D.  On: 11/23/2015 22:20   Micro Results: Recent Results (from the past 240 hour(s))  Culture, blood (Routine X 2) w Reflex to ID Panel     Status: None (Preliminary result)   Collection Time: 11/23/15  7:55 PM  Result Value Ref Range Status   Specimen Description BLOOD RIGHT HAND  Final   Special Requests BOTTLES DRAWN AEROBIC ONLY 4CC ONLY  Final   Culture NO GROWTH < 12 HOURS  Final   Report Status PENDING  Incomplete  Culture, blood (Routine X 2) w Reflex to ID Panel     Status: None (Preliminary result)   Collection Time:  11/23/15  8:01 PM  Result Value Ref Range Status   Specimen Description BLOOD LEFT HAND  Final   Special Requests BOTTLES DRAWN AEROBIC ONLY 4CC ONLY  Final   Culture NO GROWTH < 12 HOURS  Final   Report Status PENDING  Incomplete   Studies/Results: Dg Chest 2 View  11/23/2015  CLINICAL DATA:  Intermittent productive cough. Fever. Diabetes. 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 EXAM: CHEST  2 VIEW COMPARISON:  03/16/2015 and 11/08/2015 FINDINGS: Abnormal 5.6 by 4.3 cm density in the right lung apex, questionable cavitation. 2.9 by 2.2 cm nodule in the left upper lobe. Vague right mid lung density better appreciated on the lateral radiograph tracking around the minor fissure. The scattered small basilar pulmonary nodules shown on CT from 2 weeks ago are less conspicuous on conventional radiography, but there is a vague nodularity in the lungs especially on the lateral projection. Atherosclerotic aortic arch. Power injectable Port-A-Cath tip: Cavoatrial junction. IMPRESSION: 1. Masslike 5.6 by 4.3 cm right apical density, possible internal cavitation, could represent cavitary pneumonia or malignancy. 2. 2.9 cm left upper lobe pulmonary nodule. 3. Indistinct airspace opacity tracking along the minor fissure. 4. Scattered nodularity elsewhere in the lungs. 5. Atherosclerotic aortic arch. Electronically Signed   By: Van Clines M.D.   On: 11/23/2015 14:22   Ct Head Wo Contrast  11/23/2015  CLINICAL DATA:  Altered mental status. Assess for metastasis. History of hypertension, hyperlipidemia, diabetes, uterine cancer. EXAM: CT HEAD WITHOUT CONTRAST TECHNIQUE: Contiguous axial images were obtained from the base of the skull through the vertex without intravenous contrast. COMPARISON:  CT chest November 23, 2015 at 2130 hours and CT head May 24, 2006 FINDINGS: Small amount of residual intracranial contrast. The ventricles and sulci are normal for age. No intraparenchymal hemorrhage,  mass effect nor midline shift. Patchy supratentorial white matter hypodensities are less than expected for patient's age and though non-specific suggest sequelae of chronic small vessel ischemic disease. No acute large vascular territory infarcts. No abnormal extra-axial fluid collections. Basal cisterns are patent. Moderate calcific atherosclerosis of the carotid siphons. No skull fracture. The included ocular globes and orbital contents are non-suspicious. Status post bilateral ocular lens implants. Cerebellar tonsils at the foramen magnum. The mastoid aircells and included paranasal sinuses are well-aerated. IMPRESSION: Negative CT head for age. Electronically Signed   By: Elon Alas M.D.   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 EXAM: CT ANGIOGRAPHY CHEST CT ABDOMEN AND PELVIS WITH CONTRAST TECHNIQUE: Multidetector CT imaging of the chest was performed using the standard protocol during bolus administration of intravenous contrast. Multiplanar CT image reconstructions and MIPs were obtained to evaluate the vascular anatomy. Multidetector CT imaging of the abdomen and pelvis  was performed using the standard protocol during bolus administration of intravenous contrast. CONTRAST:  79mL OMNIPAQUE IOHEXOL 350 MG/ML SOLN COMPARISON:  Abdominal CT dated 11/08/2015 FINDINGS: CTA CHEST FINDINGS There bilateral pulmonary masses and nodular densities compatible with metastatic disease. The largest mass measures approximately 4.4 x 4.1 cm in the right upper lobe. There is a mass in the left hilar and suprahilar region extending superiorly abutting the mediastinal pleura. There is diffuse interstitial and septal prominence. Multiple bilateral cavitary lesions noted measuring up to 11 mm in the left lower lobe. There is no pleural effusion or pneumothorax. The central airways are patent. Mild  atherosclerotic calcification of the thoracic aorta. There is no aneurysm or dissection. There is no CT evidence of pulmonary embolism. There bilateral hilar adenopathy. Subcarinal adenopathy measuring 1.6 cm in short axis. There is no cardiomegaly or pericardial effusion. There is coronary vascular calcification. The esophagus is grossly unremarkable. Small subcentimeter left thyroid hypodense nodule. There is no axillary adenopathy. Right pectoral Port-A-Cath with tip at the cavoatrial junction. The chest wall soft tissues appear unremarkable. Multiple 3-4 mm bilateral breast calcific foci noted. There are multiple sclerotic lesions throughout the spine compatible with metastatic disease. No acute fracture. CT ABDOMEN and PELVIS FINDINGS No intra-abdominal free air or fluid. There are innumerable hepatic hypodense lesions compatible with metastatic disease. The 1.6 x 1.8 cm lesion in the right lobe of the liver measured 1.7 x 1.7 cm on the prior study. Cholecystectomy. The pancreas, spleen, and the adrenal glands appear unremarkable. There is moderate right and mild left renal atrophy. Similar to prior study. Multiple renal hypodense lesions are not well characterized but appears stable compared to the prior study and likely represent cysts. Ultrasound may provide better evaluation. There is no hydronephrosis on either side. The visualized ureters and urinary bladder appear unremarkable. Hysterectomy. There is scattered sigmoid diverticula without active inflammation. No evidence of bowel obstruction or inflammation. Stable appearance of the appendix without inflammatory changes. There is aortoiliac atherosclerotic disease. The aorta and IVC appear patent. No portal venous gas identified. Grossly stable left para-aortic adenopathy measuring approximately 12 mm in short axis. The grossly stable 9 x 6 cm cystic structure along the left pelvic wall compatible with previously described in the separate The abdominal  wall soft tissues appear unremarkable. There are sclerotic lesions of the bones compatible with metastatic disease. No acute fracture. Review of the MIP images confirms the above findings. 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 No CT evidence of pulmonary embolism. Diffuse hepatic metastatic disease grossly similar to prior study. Left para-aortic lymphadenopathy similar to prior study. Grossly stable osseous metastatic disease. No significant change in the metastatic disease in the abdomen appears since the prior study. Electronically Signed   By: Anner Crete M.D.   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 EXAM: CT ANGIOGRAPHY CHEST CT ABDOMEN AND PELVIS WITH CONTRAST TECHNIQUE: Multidetector CT imaging of the chest was performed using the standard protocol during bolus administration of intravenous contrast. Multiplanar CT image reconstructions and MIPs were obtained to evaluate the vascular anatomy. Multidetector CT imaging of the abdomen and pelvis was performed using the standard protocol during bolus administration of intravenous contrast. CONTRAST:  76mL OMNIPAQUE IOHEXOL 350 MG/ML SOLN COMPARISON:  Abdominal CT dated 11/08/2015 FINDINGS: CTA CHEST FINDINGS There bilateral pulmonary masses and nodular densities compatible with metastatic disease. The largest mass  measures approximately 4.4 x 4.1 cm in the right upper lobe. There is a mass in the left hilar and suprahilar region extending superiorly abutting the mediastinal pleura. There is diffuse interstitial and septal prominence. Multiple bilateral cavitary lesions noted measuring up to 11 mm in the left lower lobe. There is no pleural effusion or pneumothorax. The central airways are patent. Mild atherosclerotic calcification of the thoracic aorta. There is no aneurysm or  dissection. There is no CT evidence of pulmonary embolism. There bilateral hilar adenopathy. Subcarinal adenopathy measuring 1.6 cm in short axis. There is no cardiomegaly or pericardial effusion. There is coronary vascular calcification. The esophagus is grossly unremarkable. Small subcentimeter left thyroid hypodense nodule. There is no axillary adenopathy. Right pectoral Port-A-Cath with tip at the cavoatrial junction. The chest wall soft tissues appear unremarkable. Multiple 3-4 mm bilateral breast calcific foci noted. There are multiple sclerotic lesions throughout the spine compatible with metastatic disease. No acute fracture. CT ABDOMEN and PELVIS FINDINGS No intra-abdominal free air or fluid. There are innumerable hepatic hypodense lesions compatible with metastatic disease. The 1.6 x 1.8 cm lesion in the right lobe of the liver measured 1.7 x 1.7 cm on the prior study. Cholecystectomy. The pancreas, spleen, and the adrenal glands appear unremarkable. There is moderate right and mild left renal atrophy. Similar to prior study. Multiple renal hypodense lesions are not well characterized but appears stable compared to the prior study and likely represent cysts. Ultrasound may provide better evaluation. There is no hydronephrosis on either side. The visualized ureters and urinary bladder appear unremarkable. Hysterectomy. There is scattered sigmoid diverticula without active inflammation. No evidence of bowel obstruction or inflammation. Stable appearance of the appendix without inflammatory changes. There is aortoiliac atherosclerotic disease. The aorta and IVC appear patent. No portal venous gas identified. Grossly stable left para-aortic adenopathy measuring approximately 12 mm in short axis. The grossly stable 9 x 6 cm cystic structure along the left pelvic wall compatible with previously described in the separate The abdominal wall soft tissues appear unremarkable. There are sclerotic lesions of the bones  compatible with metastatic disease. No acute fracture. Review of the MIP images confirms the above findings. 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 No CT evidence of pulmonary embolism. Diffuse hepatic metastatic disease grossly similar to prior study. Left para-aortic lymphadenopathy similar to prior study. Grossly stable osseous metastatic disease. No significant change in the metastatic disease in the abdomen appears since the prior study. Electronically Signed   By: Anner Crete M.D.   On: 11/23/2015 22:20   Medications:  I have reviewed the patient's current medications Scheduled Meds: . enoxaparin (LOVENOX) injection  40 mg Subcutaneous Q24H  . gabapentin  600 mg Oral TID  . insulin aspart  0-9 Units Subcutaneous TID WC  . insulin glargine  10 Units Subcutaneous QHS   Continuous Infusions: . sodium chloride 75 mL/hr at 11/24/15 0113   PRN Meds:.ALPRAZolam, morphine injection, ondansetron **OR** ondansetron (ZOFRAN) IV   Assessment/Plan: #1. Metastatic carcinosarcoma of the uterus. No definite evidence of pneumonia by chest CT. Consult oncology for further assessment. #2. Dehydration. Continue IV fluids. #3. Diabetes. Continue sliding scale insulin. #4. Hypertension. Antihypertensives held on admission. #5. Chronic kidney disease stage III. #6. Protein calorie malnutrition. Albumin 2.9.  CODE STATUS discussed with patient, husband and daughter. The patient wishes to undergo any necessary means of support at this time. Comfort care advised. Continue morphine as needed. Further discussion in this regard with oncology pending on  Monday. 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

## 2015-11-24 NOTE — ED Notes (Signed)
Received report on pt, pt drowsy, will mumble when spoken to, family remains at bedside, updated on plan of care,

## 2015-11-25 LAB — GLUCOSE, CAPILLARY
GLUCOSE-CAPILLARY: 300 mg/dL — AB (ref 65–99)
GLUCOSE-CAPILLARY: 443 mg/dL — AB (ref 65–99)
Glucose-Capillary: 346 mg/dL — ABNORMAL HIGH (ref 65–99)
Glucose-Capillary: 299 mg/dL — ABNORMAL HIGH (ref 65–99)

## 2015-11-25 MED ORDER — INSULIN GLARGINE 100 UNIT/ML ~~LOC~~ SOLN
15.0000 [IU] | Freq: Every day | SUBCUTANEOUS | Status: DC
  Administered 2015-11-25 – 2015-11-27 (×3): 15 [IU] via SUBCUTANEOUS
  Filled 2015-11-25 (×4): qty 0.15

## 2015-11-25 MED ORDER — INSULIN ASPART 100 UNIT/ML ~~LOC~~ SOLN
15.0000 [IU] | Freq: Once | SUBCUTANEOUS | Status: AC
  Administered 2015-11-25: 15 [IU] via SUBCUTANEOUS

## 2015-11-25 NOTE — Progress Notes (Signed)
Subjective: Kristina Rodriguez is feeling better today. Her appetite has improved. She ate a small amount of supper last night and has eaten much better at breakfast this morning. She has had no fever. She denies pain now.  Objective: Vital signs in last 24 hours: Filed Vitals:   11/24/15 1514 11/24/15 2138 11/24/15 2321 11/25/15 0637  BP: 158/66 165/66  163/68  Pulse: 113 115 115 116  Temp: 97.8 F (36.6 C) 97.9 F (36.6 C)  97.8 F (36.6 C)  TempSrc:  Oral  Oral  Resp: 18 18 16 18   Height:      Weight:      SpO2: 100% 100% 94% 98%   Weight change:   Intake/Output Summary (Last 24 hours) at 11/25/15 1019 Last data filed at 11/24/15 1700  Gross per 24 hour  Intake    240 ml  Output    200 ml  Net     40 ml    Physical Exam: Alert. Stronger appearing. No scleral icterus. Pharynx moist. Lungs clear. Heart tachycardic with no murmurs. Abdomen is soft with lower abdominal tenderness. Extremities reveal no edema.  Lab Results:    Results for orders placed or performed during the hospital encounter of 11/23/15 (from the past 24 hour(s))  Glucose, capillary     Status: Abnormal   Collection Time: 11/24/15 11:58 AM  Result Value Ref Range   Glucose-Capillary 276 (H) 65 - 99 mg/dL   Comment 1 Notify RN   Glucose, capillary     Status: Abnormal   Collection Time: 11/24/15  5:19 PM  Result Value Ref Range   Glucose-Capillary 225 (H) 65 - 99 mg/dL   Comment 1 Notify RN   Glucose, capillary     Status: Abnormal   Collection Time: 11/24/15  9:35 PM  Result Value Ref Range   Glucose-Capillary 261 (H) 65 - 99 mg/dL   Comment 1 Notify RN    Comment 2 Document in Chart   Glucose, capillary     Status: Abnormal   Collection Time: 11/25/15  7:45 AM  Result Value Ref Range   Glucose-Capillary 300 (H) 65 - 99 mg/dL   Comment 1 Notify RN      ABGS No results for input(s): PHART, PO2ART, TCO2, HCO3 in the last 72 hours.  Invalid input(s): PCO2 CULTURES Recent Results (from the past 240  hour(s))  Culture, blood (Routine X 2) w Reflex to ID Panel     Status: None (Preliminary result)   Collection Time: 11/23/15  7:55 PM  Result Value Ref Range Status   Specimen Description BLOOD RIGHT HAND  Final   Special Requests BOTTLES DRAWN AEROBIC ONLY 4CC ONLY  Final   Culture NO GROWTH 2 DAYS  Final   Report Status PENDING  Incomplete  Culture, blood (Routine X 2) w Reflex to ID Panel     Status: None (Preliminary result)   Collection Time: 11/23/15  8:01 PM  Result Value Ref Range Status   Specimen Description BLOOD LEFT HAND  Final   Special Requests BOTTLES DRAWN AEROBIC ONLY 4CC ONLY  Final   Culture NO GROWTH 2 DAYS  Final   Report Status PENDING  Incomplete   Studies/Results: Dg Chest 2 View  11/23/2015  CLINICAL DATA:  Intermittent productive cough. Fever. Diabetes. 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 EXAM: CHEST  2 VIEW COMPARISON:  03/16/2015 and 11/08/2015 FINDINGS: Abnormal 5.6 by 4.3 cm density in the right lung apex, questionable cavitation. 2.9 by 2.2  cm nodule in the left upper lobe. Vague right mid lung density better appreciated on the lateral radiograph tracking around the minor fissure. The scattered small basilar pulmonary nodules shown on CT from 2 weeks ago are less conspicuous on conventional radiography, but there is a vague nodularity in the lungs especially on the lateral projection. Atherosclerotic aortic arch. Power injectable Port-A-Cath tip: Cavoatrial junction. IMPRESSION: 1. Masslike 5.6 by 4.3 cm right apical density, possible internal cavitation, could represent cavitary pneumonia or malignancy. 2. 2.9 cm left upper lobe pulmonary nodule. 3. Indistinct airspace opacity tracking along the minor fissure. 4. Scattered nodularity elsewhere in the lungs. 5. Atherosclerotic aortic arch. Electronically Signed   By: Van Clines M.D.   On: 11/23/2015 14:22   Ct Head Wo Contrast  11/23/2015  CLINICAL DATA:  Altered mental status.  Assess for metastasis. History of hypertension, hyperlipidemia, diabetes, uterine cancer. EXAM: CT HEAD WITHOUT CONTRAST TECHNIQUE: Contiguous axial images were obtained from the base of the skull through the vertex without intravenous contrast. COMPARISON:  CT chest November 23, 2015 at 2130 hours and CT head May 24, 2006 FINDINGS: Small amount of residual intracranial contrast. The ventricles and sulci are normal for age. No intraparenchymal hemorrhage, mass effect nor midline shift. Patchy supratentorial white matter hypodensities are less than expected for patient's age and though non-specific suggest sequelae of chronic small vessel ischemic disease. No acute large vascular territory infarcts. No abnormal extra-axial fluid collections. Basal cisterns are patent. Moderate calcific atherosclerosis of the carotid siphons. No skull fracture. The included ocular globes and orbital contents are non-suspicious. Status post bilateral ocular lens implants. Cerebellar tonsils at the foramen magnum. The mastoid aircells and included paranasal sinuses are well-aerated. IMPRESSION: Negative CT head for age. Electronically Signed   By: Elon Alas M.D.   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 EXAM: CT ANGIOGRAPHY CHEST CT ABDOMEN AND PELVIS WITH CONTRAST TECHNIQUE: Multidetector CT imaging of the chest was performed using the standard protocol during bolus administration of intravenous contrast. Multiplanar CT image reconstructions and MIPs were obtained to evaluate the vascular anatomy. Multidetector CT imaging of the abdomen and pelvis was performed using the standard protocol during bolus administration of intravenous contrast. CONTRAST:  37mL OMNIPAQUE IOHEXOL 350 MG/ML SOLN COMPARISON:  Abdominal CT dated 11/08/2015 FINDINGS: CTA CHEST FINDINGS There bilateral pulmonary masses and  nodular densities compatible with metastatic disease. The largest mass measures approximately 4.4 x 4.1 cm in the right upper lobe. There is a mass in the left hilar and suprahilar region extending superiorly abutting the mediastinal pleura. There is diffuse interstitial and septal prominence. Multiple bilateral cavitary lesions noted measuring up to 11 mm in the left lower lobe. There is no pleural effusion or pneumothorax. The central airways are patent. Mild atherosclerotic calcification of the thoracic aorta. There is no aneurysm or dissection. There is no CT evidence of pulmonary embolism. There bilateral hilar adenopathy. Subcarinal adenopathy measuring 1.6 cm in short axis. There is no cardiomegaly or pericardial effusion. There is coronary vascular calcification. The esophagus is grossly unremarkable. Small subcentimeter left thyroid hypodense nodule. There is no axillary adenopathy. Right pectoral Port-A-Cath with tip at the cavoatrial junction. The chest wall soft tissues appear unremarkable. Multiple 3-4 mm bilateral breast calcific foci noted. There are multiple sclerotic lesions throughout the spine compatible with metastatic disease. No acute fracture. CT ABDOMEN and PELVIS FINDINGS  No intra-abdominal free air or fluid. There are innumerable hepatic hypodense lesions compatible with metastatic disease. The 1.6 x 1.8 cm lesion in the right lobe of the liver measured 1.7 x 1.7 cm on the prior study. Cholecystectomy. The pancreas, spleen, and the adrenal glands appear unremarkable. There is moderate right and mild left renal atrophy. Similar to prior study. Multiple renal hypodense lesions are not well characterized but appears stable compared to the prior study and likely represent cysts. Ultrasound may provide better evaluation. There is no hydronephrosis on either side. The visualized ureters and urinary bladder appear unremarkable. Hysterectomy. There is scattered sigmoid diverticula without active  inflammation. No evidence of bowel obstruction or inflammation. Stable appearance of the appendix without inflammatory changes. There is aortoiliac atherosclerotic disease. The aorta and IVC appear patent. No portal venous gas identified. Grossly stable left para-aortic adenopathy measuring approximately 12 mm in short axis. The grossly stable 9 x 6 cm cystic structure along the left pelvic wall compatible with previously described in the separate The abdominal wall soft tissues appear unremarkable. There are sclerotic lesions of the bones compatible with metastatic disease. No acute fracture. Review of the MIP images confirms the above findings. 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 No CT evidence of pulmonary embolism. Diffuse hepatic metastatic disease grossly similar to prior study. Left para-aortic lymphadenopathy similar to prior study. Grossly stable osseous metastatic disease. No significant change in the metastatic disease in the abdomen appears since the prior study. Electronically Signed   By: Anner Crete M.D.   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 EXAM: CT ANGIOGRAPHY CHEST CT ABDOMEN AND PELVIS WITH CONTRAST TECHNIQUE: Multidetector CT imaging of the chest was performed using the standard protocol during bolus administration of intravenous contrast. Multiplanar CT image reconstructions and MIPs were obtained to evaluate the vascular anatomy. Multidetector CT imaging of the abdomen and pelvis was performed using the standard protocol during bolus administration of intravenous contrast. CONTRAST:  45mL OMNIPAQUE IOHEXOL 350 MG/ML SOLN COMPARISON:  Abdominal CT dated 11/08/2015 FINDINGS: CTA CHEST FINDINGS There bilateral pulmonary masses and nodular densities compatible with metastatic disease. The largest mass measures  approximately 4.4 x 4.1 cm in the right upper lobe. There is a mass in the left hilar and suprahilar region extending superiorly abutting the mediastinal pleura. There is diffuse interstitial and septal prominence. Multiple bilateral cavitary lesions noted measuring up to 11 mm in the left lower lobe. There is no pleural effusion or pneumothorax. The central airways are patent. Mild atherosclerotic calcification of the thoracic aorta. There is no aneurysm or dissection. There is no CT evidence of pulmonary embolism. There bilateral hilar adenopathy. Subcarinal adenopathy measuring 1.6 cm in short axis. There is no cardiomegaly or pericardial effusion. There is coronary vascular calcification. The esophagus is grossly unremarkable. Small subcentimeter left thyroid hypodense nodule. There is no axillary adenopathy. Right pectoral Port-A-Cath with tip at the cavoatrial junction. The chest wall soft tissues appear unremarkable. Multiple 3-4 mm bilateral breast calcific foci noted. There are multiple sclerotic lesions throughout the spine compatible with metastatic disease. No acute fracture. CT ABDOMEN and PELVIS FINDINGS No intra-abdominal free air or fluid. There are innumerable hepatic hypodense lesions compatible with metastatic disease. The 1.6 x 1.8 cm lesion in the right lobe of the liver measured 1.7 x 1.7 cm on the prior study. Cholecystectomy. The pancreas, spleen, and the  adrenal glands appear unremarkable. There is moderate right and mild left renal atrophy. Similar to prior study. Multiple renal hypodense lesions are not well characterized but appears stable compared to the prior study and likely represent cysts. Ultrasound may provide better evaluation. There is no hydronephrosis on either side. The visualized ureters and urinary bladder appear unremarkable. Hysterectomy. There is scattered sigmoid diverticula without active inflammation. No evidence of bowel obstruction or inflammation. Stable appearance  of the appendix without inflammatory changes. There is aortoiliac atherosclerotic disease. The aorta and IVC appear patent. No portal venous gas identified. Grossly stable left para-aortic adenopathy measuring approximately 12 mm in short axis. The grossly stable 9 x 6 cm cystic structure along the left pelvic wall compatible with previously described in the separate The abdominal wall soft tissues appear unremarkable. There are sclerotic lesions of the bones compatible with metastatic disease. No acute fracture. Review of the MIP images confirms the above findings. 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 No CT evidence of pulmonary embolism. Diffuse hepatic metastatic disease grossly similar to prior study. Left para-aortic lymphadenopathy similar to prior study. Grossly stable osseous metastatic disease. No significant change in the metastatic disease in the abdomen appears since the prior study. Electronically Signed   By: Anner Crete M.D.   On: 11/23/2015 22:20   Micro Results: Recent Results (from the past 240 hour(s))  Culture, blood (Routine X 2) w Reflex to ID Panel     Status: None (Preliminary result)   Collection Time: 11/23/15  7:55 PM  Result Value Ref Range Status   Specimen Description BLOOD RIGHT HAND  Final   Special Requests BOTTLES DRAWN AEROBIC ONLY 4CC ONLY  Final   Culture NO GROWTH 2 DAYS  Final   Report Status PENDING  Incomplete  Culture, blood (Routine X 2) w Reflex to ID Panel     Status: None (Preliminary result)   Collection Time: 11/23/15  8:01 PM  Result Value Ref Range Status   Specimen Description BLOOD LEFT HAND  Final   Special Requests BOTTLES DRAWN AEROBIC ONLY 4CC ONLY  Final   Culture NO GROWTH 2 DAYS  Final   Report Status PENDING  Incomplete   Studies/Results: Dg Chest 2 View  11/23/2015  CLINICAL DATA:  Intermittent productive cough. Fever. Diabetes. History of uterine sarcoma metastatic to the lungs  and liver. EXAM: CHEST  2 VIEW COMPARISON:  03/16/2015 and 11/08/2015 FINDINGS: Abnormal 5.6 by 4.3 cm density in the right lung apex, questionable cavitation. 2.9 by 2.2 cm nodule in the left upper lobe. Vague right mid lung density better appreciated on the lateral radiograph tracking around the minor fissure. The scattered small basilar pulmonary nodules shown on CT from 2 weeks ago are less conspicuous on conventional radiography, but there is a vague nodularity in the lungs especially on the lateral projection. Atherosclerotic aortic arch. Power injectable Port-A-Cath tip: Cavoatrial junction. IMPRESSION: 1. Masslike 5.6 by 4.3 cm right apical density, possible internal cavitation, could represent cavitary pneumonia or malignancy. 2. 2.9 cm left upper lobe pulmonary nodule. 3. Indistinct airspace opacity tracking along the minor fissure. 4. Scattered nodularity elsewhere in the lungs. 5. Atherosclerotic aortic arch. Electronically Signed   By: Van Clines M.D.   On: 11/23/2015 14:22   Ct Head Wo Contrast  11/23/2015  CLINICAL DATA:  Altered mental status. Assess for metastasis. History of hypertension, hyperlipidemia, diabetes, uterine cancer. EXAM: CT HEAD WITHOUT CONTRAST TECHNIQUE: Contiguous axial images were obtained from the base of  the skull through the vertex without intravenous contrast. COMPARISON:  CT chest November 23, 2015 at 2130 hours and CT head May 24, 2006 FINDINGS: Small amount of residual intracranial contrast. The ventricles and sulci are normal for age. No intraparenchymal hemorrhage, mass effect nor midline shift. Patchy supratentorial white matter hypodensities are less than expected for patient's age and though non-specific suggest sequelae of chronic small vessel ischemic disease. No acute large vascular territory infarcts. No abnormal extra-axial fluid collections. Basal cisterns are patent. Moderate calcific atherosclerosis of the carotid siphons. No skull fracture. The  included ocular globes and orbital contents are non-suspicious. Status post bilateral ocular lens implants. Cerebellar tonsils at the foramen magnum. The mastoid aircells and included paranasal sinuses are well-aerated. IMPRESSION: Negative CT head for age. Electronically Signed   By: Elon Alas M.D.   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 EXAM: CT ANGIOGRAPHY CHEST CT ABDOMEN AND PELVIS WITH CONTRAST TECHNIQUE: Multidetector CT imaging of the chest was performed using the standard protocol during bolus administration of intravenous contrast. Multiplanar CT image reconstructions and MIPs were obtained to evaluate the vascular anatomy. Multidetector CT imaging of the abdomen and pelvis was performed using the standard protocol during bolus administration of intravenous contrast. CONTRAST:  19mL OMNIPAQUE IOHEXOL 350 MG/ML SOLN COMPARISON:  Abdominal CT dated 11/08/2015 FINDINGS: CTA CHEST FINDINGS There bilateral pulmonary masses and nodular densities compatible with metastatic disease. The largest mass measures approximately 4.4 x 4.1 cm in the right upper lobe. There is a mass in the left hilar and suprahilar region extending superiorly abutting the mediastinal pleura. There is diffuse interstitial and septal prominence. Multiple bilateral cavitary lesions noted measuring up to 11 mm in the left lower lobe. There is no pleural effusion or pneumothorax. The central airways are patent. Mild atherosclerotic calcification of the thoracic aorta. There is no aneurysm or dissection. There is no CT evidence of pulmonary embolism. There bilateral hilar adenopathy. Subcarinal adenopathy measuring 1.6 cm in short axis. There is no cardiomegaly or pericardial effusion. There is coronary vascular calcification. The esophagus is grossly unremarkable. Small subcentimeter left thyroid hypodense  nodule. There is no axillary adenopathy. Right pectoral Port-A-Cath with tip at the cavoatrial junction. The chest wall soft tissues appear unremarkable. Multiple 3-4 mm bilateral breast calcific foci noted. There are multiple sclerotic lesions throughout the spine compatible with metastatic disease. No acute fracture. CT ABDOMEN and PELVIS FINDINGS No intra-abdominal free air or fluid. There are innumerable hepatic hypodense lesions compatible with metastatic disease. The 1.6 x 1.8 cm lesion in the right lobe of the liver measured 1.7 x 1.7 cm on the prior study. Cholecystectomy. The pancreas, spleen, and the adrenal glands appear unremarkable. There is moderate right and mild left renal atrophy. Similar to prior study. Multiple renal hypodense lesions are not well characterized but appears stable compared to the prior study and likely represent cysts. Ultrasound may provide better evaluation. There is no hydronephrosis on either side. The visualized ureters and urinary bladder appear unremarkable. Hysterectomy. There is scattered sigmoid diverticula without active inflammation. No evidence of bowel obstruction or inflammation. Stable appearance of the appendix without inflammatory changes. There is aortoiliac atherosclerotic disease. The aorta and IVC appear patent. No portal venous gas identified. Grossly stable left para-aortic adenopathy measuring approximately 12 mm in short axis. The grossly stable 9 x 6 cm cystic structure along the left pelvic  wall compatible with previously described in the separate The abdominal wall soft tissues appear unremarkable. There are sclerotic lesions of the bones compatible with metastatic disease. No acute fracture. Review of the MIP images confirms the above findings. 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 No CT evidence of pulmonary embolism. Diffuse hepatic metastatic disease grossly similar to prior study. Left para-aortic  lymphadenopathy similar to prior study. Grossly stable osseous metastatic disease. No significant change in the metastatic disease in the abdomen appears since the prior study. Electronically Signed   By: Anner Crete M.D.   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 EXAM: CT ANGIOGRAPHY CHEST CT ABDOMEN AND PELVIS WITH CONTRAST TECHNIQUE: Multidetector CT imaging of the chest was performed using the standard protocol during bolus administration of intravenous contrast. Multiplanar CT image reconstructions and MIPs were obtained to evaluate the vascular anatomy. Multidetector CT imaging of the abdomen and pelvis was performed using the standard protocol during bolus administration of intravenous contrast. CONTRAST:  87mL OMNIPAQUE IOHEXOL 350 MG/ML SOLN COMPARISON:  Abdominal CT dated 11/08/2015 FINDINGS: CTA CHEST FINDINGS There bilateral pulmonary masses and nodular densities compatible with metastatic disease. The largest mass measures approximately 4.4 x 4.1 cm in the right upper lobe. There is a mass in the left hilar and suprahilar region extending superiorly abutting the mediastinal pleura. There is diffuse interstitial and septal prominence. Multiple bilateral cavitary lesions noted measuring up to 11 mm in the left lower lobe. There is no pleural effusion or pneumothorax. The central airways are patent. Mild atherosclerotic calcification of the thoracic aorta. There is no aneurysm or dissection. There is no CT evidence of pulmonary embolism. There bilateral hilar adenopathy. Subcarinal adenopathy measuring 1.6 cm in short axis. There is no cardiomegaly or pericardial effusion. There is coronary vascular calcification. The esophagus is grossly unremarkable. Small subcentimeter left thyroid hypodense nodule. There is no axillary adenopathy. Right pectoral Port-A-Cath with tip at the  cavoatrial junction. The chest wall soft tissues appear unremarkable. Multiple 3-4 mm bilateral breast calcific foci noted. There are multiple sclerotic lesions throughout the spine compatible with metastatic disease. No acute fracture. CT ABDOMEN and PELVIS FINDINGS No intra-abdominal free air or fluid. There are innumerable hepatic hypodense lesions compatible with metastatic disease. The 1.6 x 1.8 cm lesion in the right lobe of the liver measured 1.7 x 1.7 cm on the prior study. Cholecystectomy. The pancreas, spleen, and the adrenal glands appear unremarkable. There is moderate right and mild left renal atrophy. Similar to prior study. Multiple renal hypodense lesions are not well characterized but appears stable compared to the prior study and likely represent cysts. Ultrasound may provide better evaluation. There is no hydronephrosis on either side. The visualized ureters and urinary bladder appear unremarkable. Hysterectomy. There is scattered sigmoid diverticula without active inflammation. No evidence of bowel obstruction or inflammation. Stable appearance of the appendix without inflammatory changes. There is aortoiliac atherosclerotic disease. The aorta and IVC appear patent. No portal venous gas identified. Grossly stable left para-aortic adenopathy measuring approximately 12 mm in short axis. The grossly stable 9 x 6 cm cystic structure along the left pelvic wall compatible with previously described in the separate The abdominal wall soft tissues appear unremarkable. There are sclerotic lesions of the bones compatible with metastatic disease. No acute fracture. Review of the MIP images confirms the above findings. IMPRESSION: Extensive pulmonary metastatic disease with  large bilateral pulmonary masses. No CT evidence of pulmonary embolism. Diffuse hepatic metastatic disease grossly similar to prior study. Left para-aortic lymphadenopathy similar to prior study. Grossly stable osseous metastatic disease.  No significant change in the metastatic disease in the abdomen appears since the prior study. Electronically Signed   By: Anner Crete M.D.   On: 11/23/2015 22:20   Medications:  I have reviewed the patient's current medications Scheduled Meds: . enoxaparin (LOVENOX) injection  40 mg Subcutaneous Q24H  . gabapentin  600 mg Oral TID  . insulin aspart  0-9 Units Subcutaneous TID WC  . insulin glargine  10 Units Subcutaneous QHS   Continuous Infusions: . sodium chloride 75 mL/hr at 11/24/15 0113   PRN Meds:.ALPRAZolam, morphine injection, ondansetron **OR** ondansetron (ZOFRAN) IV   Assessment/Plan: #1. 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 Oncology consult tomorrow morning. #2. Dehydration. Improved. #3. Protein calorie malnutrition. Calorie intake improving. #4. Diabetes. Glucoses ranged from 200-300 range. Holding metformin following contrast CT. Increase Lantus. Continue sliding scale NovoLog.  Up in chair today if possible. 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

## 2015-11-26 DIAGNOSIS — R109 Unspecified abdominal pain: Secondary | ICD-10-CM

## 2015-11-26 DIAGNOSIS — C787 Secondary malignant neoplasm of liver and intrahepatic bile duct: Secondary | ICD-10-CM

## 2015-11-26 DIAGNOSIS — C7800 Secondary malignant neoplasm of unspecified lung: Secondary | ICD-10-CM

## 2015-11-26 DIAGNOSIS — R5383 Other fatigue: Secondary | ICD-10-CM

## 2015-11-26 DIAGNOSIS — C55 Malignant neoplasm of uterus, part unspecified: Secondary | ICD-10-CM

## 2015-11-26 DIAGNOSIS — R1900 Intra-abdominal and pelvic swelling, mass and lump, unspecified site: Secondary | ICD-10-CM

## 2015-11-26 DIAGNOSIS — C7951 Secondary malignant neoplasm of bone: Secondary | ICD-10-CM

## 2015-11-26 LAB — GLUCOSE, CAPILLARY
Glucose-Capillary: 250 mg/dL — ABNORMAL HIGH (ref 65–99)
Glucose-Capillary: 286 mg/dL — ABNORMAL HIGH (ref 65–99)
Glucose-Capillary: 220 mg/dL — ABNORMAL HIGH (ref 65–99)
Glucose-Capillary: 220 mg/dL — ABNORMAL HIGH (ref 65–99)

## 2015-11-26 LAB — BASIC METABOLIC PANEL
ANION GAP: 11 (ref 5–15)
BUN: 19 mg/dL (ref 6–20)
CALCIUM: 8.7 mg/dL — AB (ref 8.9–10.3)
CHLORIDE: 96 mmol/L — AB (ref 101–111)
CO2: 25 mmol/L (ref 22–32)
Creatinine, Ser: 0.83 mg/dL (ref 0.44–1.00)
GFR calc Af Amer: >60 mL/min (ref >60)
GLUCOSE: 278 mg/dL — AB (ref 65–99)
Potassium: 3.3 mmol/L — ABNORMAL LOW (ref 3.5–5.1)
Sodium: 132 mmol/L — ABNORMAL LOW (ref 135–145)

## 2015-11-26 LAB — URINE CULTURE: CULTURE: NO GROWTH

## 2015-11-26 LAB — HEMOGLOBIN A1C
HEMOGLOBIN A1C: 9.9 % — AB (ref 4.8–5.6)
MEAN PLASMA GLUCOSE: 237 mg/dL

## 2015-11-26 MED ORDER — POTASSIUM CHLORIDE CRYS ER 20 MEQ PO TBCR
20.0000 meq | EXTENDED_RELEASE_TABLET | Freq: Three times a day (TID) | ORAL | Status: DC
  Administered 2015-11-26 – 2015-11-28 (×6): 20 meq via ORAL
  Filled 2015-11-26 (×6): qty 1

## 2015-11-26 MED ORDER — INSULIN ASPART 100 UNIT/ML ~~LOC~~ SOLN
0.0000 [IU] | Freq: Every day | SUBCUTANEOUS | Status: DC
  Administered 2015-11-26: 2 [IU] via SUBCUTANEOUS

## 2015-11-26 MED ORDER — LOSARTAN POTASSIUM 50 MG PO TABS
100.0000 mg | ORAL_TABLET | Freq: Every day | ORAL | Status: DC
  Administered 2015-11-26 – 2015-11-28 (×3): 100 mg via ORAL
  Filled 2015-11-26 (×3): qty 2

## 2015-11-26 MED ORDER — HYDROCODONE-ACETAMINOPHEN 5-325 MG PO TABS
1.0000 | ORAL_TABLET | ORAL | Status: DC | PRN
Start: 2015-11-26 — End: 2015-11-28
  Administered 2015-11-26: 1 via ORAL
  Filled 2015-11-26: qty 1

## 2015-11-26 MED ORDER — GLIPIZIDE 5 MG PO TABS
5.0000 mg | ORAL_TABLET | Freq: Every day | ORAL | Status: DC
  Administered 2015-11-26 – 2015-11-28 (×3): 5 mg via ORAL
  Filled 2015-11-26 (×3): qty 1

## 2015-11-26 MED ORDER — INSULIN ASPART 100 UNIT/ML ~~LOC~~ SOLN
0.0000 [IU] | Freq: Three times a day (TID) | SUBCUTANEOUS | Status: DC
  Administered 2015-11-26 – 2015-11-27 (×2): 5 [IU] via SUBCUTANEOUS
  Administered 2015-11-27: 8 [IU] via SUBCUTANEOUS
  Administered 2015-11-27 – 2015-11-28 (×3): 3 [IU] via SUBCUTANEOUS

## 2015-11-26 NOTE — Progress Notes (Signed)
Inpatient Diabetes Program Recommendations  AACE/ADA: New Consensus Statement on Inpatient Glycemic Control (2015)  Target Ranges:  Prepandial:   less than 140 mg/dL      Peak postprandial:   less than 180 mg/dL (1-2 hours)      Critically ill patients:  140 - 180 mg/dL   Review of Glycemic Control  Diabetes history: type 2 Outpatient Diabetes medications: Glipizide and metformin Current orders for Inpatient glycemic control: Glipizide 5 and sensitive correction tidwc  Inpatient Diabetes Program Recommendations: May want to consider increase correction to moderate tidwc and add HS scale.  Thank you Rosita Kea, RN, MSN, CDE  Diabetes Inpatient Program Office: [phone] Pager: [phone] 8:00 am to 5:00 pm

## 2015-11-26 NOTE — Consult Note (Signed)
_0 @       [facility] Hematology/Oncology Consultation   Name: Kristina Rodriguez      MRN: [id number]    Location: A263/F354-56  Date: 11/26/2015 Time:6:50 PM   REFERRING PHYSICIAN:  [name], MD  REASON FOR CONSULT:  Mets   DIAGNOSIS:  Stage III carcinosarcoma of the uterus, status post TAH, BSO, pelvic and para-aortic lymphadenectomy. Status post 2 cycles of carboplatin/Taxol resulting in severe peripheral neuropathy, radiotherapy completed on 10/31/2014.  HISTORY OF PRESENT ILLNESS:   Kristina Rodriguez is a 75 yo white American female who is well known to the [facility] where she was treated for Stage III carcinosarcoma of the uterus, status post TAH, BSO, pelvic and para-aortic lymphadenectomy. She was treated with chemotherapy and radiation therapy.  She is status post 2 cycles of carboplatin/Taxol resulting in severe peripheral neuropathy,radiotherapy completed on 10/31/2014.  Oncology History   75 year old woman with stage IIIA carcinosarcoma of the uterus (with microscopic ovarian involvement on final pathology, negative cervix, negative pelvic and para-aortic nodes).     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   06/12/2014 Initial Diagnosis Uterine endometrial cancer, sarcoma   06/16/2014 Definitive Surgery robotic hysterectomy, BSO, pelvic and para-aortic lymphadenectomy   07/13/2014 - 08/11/2014 Chemotherapy Paclitaxel and Carboplatin   08/24/2014 Tumor Marker CA 125: 17    08/24/2014 Adverse Reaction Significant peripheral neuropathy.  Chemotherapy held and cancelled.  She will move on to radiation therapy, sooner than planned.   09/20/2014 - 10/31/2014 Radiation Therapy 4500 cGy in 25 fractions to pelvis.  Proximal vagina/parametrial area boosted to cumulative dose of 5040 cGy.  Treated by Dr. Sondra Come.   03/16/2015 Imaging CT abd/pelvis- No evidence of residual or metastatic carcinoma  within the pelvis or abdomen. New patchy airspace disease seen in right middle lobe, suspicious for infectious or inflammatory process   04/18/2015 Tumor Marker CA 125: 950.6 (H)   10/31/2015 Tumor Marker CA 125: 3062.0 (H)    11/08/2015 Imaging CT abd/pelvis- New small bibasilar pulm nodules, consistent with pulm metastases. New diffuse small liver metastases. New mild abdominal retroperitoneal lymphadenopathy, consistent with metastatic disease. New sclerotic bone metastases.   11/08/2015 Progression Imaging confirming progression of disease   11/15/2015 Miscellaneous Patient cancelled appt.   11/19/2015 Miscellaneous Patient cancelled appt   11/23/2015 Imaging CT CAP- Extensive pulmonary metastatic disease with large bilateral pulmonary masses. Grossly stable osseous metastatic disease.  Diffuse hepatic metastatic disease grossly similar to prior study.   11/24/2015 -  Hospital Admission Generalized weakness, progressive malignancy, clinical changes (sudden) related to progressive malignancy.   I personally reviewed and went over laboratory results with the patient.  The results are noted within this dictation.  I personally reviewed and went over radiographic studies with the patient.  The results are noted within this dictation.  Imaging demonstrates progression of disease.  She reported to the ED because she fell in the bathroom.  She reports that her legs got weak.  She cannot identify when her weakness began.  She was helped up by her family and Dr. Willey Blade was contacted.  He recommended ED visit.  One can review her most recent office visit for more details regarding her recent changes from a malignancy standpoint.  We spent some time discussing goals of care.  We discussed her stage of disease and prognosis.  PAST MEDICAL HISTORY:   Past Medical History  Diagnosis Date  . Diabetes mellitus, type  II ([place])   . Hypertension   . Hyperlipidemia   . Osteoarthritis   . Iron deficiency anemia      attributed to long-term treatment with a PPI  . Anxiety and depression   . Hepatic steatosis 2006    mild  . Cholelithiasis 2006    Acute and chronic cholecystitis; laparoscopic cholecystectomy in 2006  . 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 Chronic pain   . Collagen vascular disease ([place])   . Anxiety   . Uterine cancer ([place]) 7/15    carcinosarcoma    ALLERGIES: Allergies  Allergen Reactions  . Codeine Nausea Only      MEDICATIONS: I have reviewed the patient's current medications.    No current facility-administered medications on file prior to encounter.   Current Outpatient Prescriptions on File Prior to Encounter  Medication Sig Dispense Refill  . glipiZIDE (GLUCOTROL) 5 MG tablet Take 5 mg by mouth daily.     . diphenoxylate-atropine (LOMOTIL) 2.5-0.025 MG tablet        PAST SURGICAL HISTORY Past Surgical History  Procedure Laterality Date  . Tonsillectomy    . Carpal tunnel release  1994    Right  . Cholecystectomy, laparoscopic  2006    Dr. Romona Curls; cholelithiasis  . Colonoscopy  2003    Najeeb Rehman;iron deficiency anemia; normal study; hiatal hernia, gastritis, Schatzki's ring on EGD  . Breast biopsy  10/2012    Left; fibroadenoma  . Cholecystectomy    . Tubal ligation  1970s    removed  . Hysteroscopy w/d&c N/A 06/06/2014    Procedure: DILATATION AND CURETTAGE /HYSTEROSCOPY;  Surgeon: Jonnie Kind, MD;  Location: [facility] ORS;  Service: Gynecology;  Laterality: N/A;  . Polypectomy N/A 06/06/2014    Procedure: POLYPECTOMY;  Surgeon: Jonnie Kind, MD;  Location: [facility] ORS;  Service: Gynecology;  Laterality: N/A;  . Abdominal hysterectomy  06/16/14    robotic hysterectomy, BSO, pelvic and para-aortic lymphadenectomy for uterine carcinosarcoma  . Colonoscopy N/A 06/13/2015    Procedure: COLONOSCOPY;  Surgeon: Rogene Houston, MD;  Location: [facility] END[facility] SUITE;  Service: Endoscopy;  Laterality: N/A;  145    FAMILY HISTORY: Family  History  Problem Relation Age of Onset  . Cancer Mother 51    pancreas and liver  . Diabetes Brother   . Congestive Heart Failure Father   . Hemochromatosis Brother   . Cancer Maternal Aunt   . Cancer Maternal Uncle     SOCIAL HISTORY:  reports that she has never smoked. She has never used smokeless tobacco. She reports that she does not drink alcohol or use illicit drugs.  PERFORMANCE STATUS: The patient's performance status is 2 - Symptomatic, <50% confined to bed  PHYSICAL EXAM: Most Recent Vital Signs: Blood pressure 159/73, pulse 114, temperature 98.4 F (36.9 C), temperature source Oral, resp. rate 20, height _0  (1.676 m), weight 146 lb 9.7 oz (66.5 kg), SpO2 100 %. General appearance: alert, cooperative, appears older than stated age, moderate distress, mildly obese, slowed mentation and family at the bedside, daughter and husband Head: Normocephalic, without obvious abnormality, atraumatic Eyes: negative findings: lids and lashes normal, conjunctivae and sclerae normal and corneas clear Neck: supple, symmetrical, trachea midline Lungs: clear to auscultation bilaterally Heart: regular rate and rhythm, S1, S2 normal, no murmur, click, rub or gallop Abdomen: abnormal findings:  mass, located in the RLQ and up to the level of the umbilicus Extremities: extremities normal, atraumatic,  no cyanosis or edema Skin: Skin color, texture, turgor normal. No rashes or lesions Neurologic: Grossly normal but tired with episodes of sleeping during discussion.  LABORATORY DATA:  Results for orders placed or performed during the hospital encounter of 11/23/15 (from the past 48 hour(s))  Glucose, capillary     Status: Abnormal   Collection Time: 11/24/15  9:35 PM  Result Value Ref Range   Glucose-Capillary 261 (H) 65 - 99 mg/dL   Comment 1 Notify RN    Comment 2 Document in Chart   Glucose, capillary     Status: Abnormal   Collection Time: 11/25/15  7:45 AM  Result Value Ref Range    Glucose-Capillary 300 (H) 65 - 99 mg/dL   Comment 1 Notify RN   Glucose, capillary     Status: Abnormal   Collection Time: 11/25/15 11:37 AM  Result Value Ref Range   Glucose-Capillary 443 (H) 65 - 99 mg/dL   Comment 1 Notify RN   Glucose, capillary     Status: Abnormal   Collection Time: 11/25/15  4:49 PM  Result Value Ref Range   Glucose-Capillary 346 (H) 65 - 99 mg/dL   Comment 1 Notify RN   Glucose, capillary     Status: Abnormal   Collection Time: 11/25/15  8:31 PM  Result Value Ref Range   Glucose-Capillary 299 (H) 65 - 99 mg/dL   Comment 1 Notify RN    Comment 2 Document in Chart   Basic metabolic panel     Status: Abnormal   Collection Time: 11/26/15  5:55 AM  Result Value Ref Range   Sodium 132 (L) 135 - 145 mmol/L   Potassium 3.3 (L) 3.5 - 5.1 mmol/L    Comment: DELTA CHECK NOTED   Chloride 96 (L) 101 - 111 mmol/L   CO2 25 22 - 32 mmol/L   Glucose, Bld 278 (H) 65 - 99 mg/dL   BUN 19 6 - 20 mg/dL   Creatinine, Ser 0.83 0.44 - 1.00 mg/dL   Calcium 8.7 (L) 8.9 - 10.3 mg/dL   GFR calc non Af Amer >60 >60 mL/min   GFR calc Af Amer >60 >60 mL/min    Comment: (NOTE) The eGFR has been calculated using the CKD EPI equation. This calculation has not been validated in all clinical situations. eGFR's persistently <60 mL/min signify possible Chronic Kidney Disease.    Anion gap 11 5 - 15  Glucose, capillary     Status: Abnormal   Collection Time: 11/26/15  7:57 AM  Result Value Ref Range   Glucose-Capillary 250 (H) 65 - 99 mg/dL  Glucose, capillary     Status: Abnormal   Collection Time: 11/26/15 11:09 AM  Result Value Ref Range   Glucose-Capillary 286 (H) 65 - 99 mg/dL  Glucose, capillary     Status: Abnormal   Collection Time: 11/26/15  4:42 PM  Result Value Ref Range   Glucose-Capillary 220 (H) 65 - 99 mg/dL      RADIOGRAPHY:  CLINICAL DATA: 75 year old female with metastatic uterine carcinoma and abnormal chest x-ray.  EXAM: CT ANGIOGRAPHY CHEST  CT  ABDOMEN AND PELVIS WITH CONTRAST  TECHNIQUE: Multidetector CT imaging of the chest was performed using the standard protocol during bolus administration of intravenous contrast. Multiplanar CT image reconstructions and MIPs were obtained to evaluate the vascular anatomy. Multidetector CT imaging of the abdomen and pelvis was performed using the standard protocol during bolus administration of intravenous contrast.  CONTRAST: 12m OMNIPAQUE IOHEXOL 350 MG/ML SOLN  COMPARISON: Abdominal CT dated 11/08/2015  FINDINGS: CTA CHEST FINDINGS  There bilateral pulmonary masses and nodular densities compatible with metastatic disease. The largest mass measures approximately 4.4 x 4.1 cm in the right upper lobe. There is a mass in the left hilar and suprahilar region extending superiorly abutting the mediastinal pleura. There is diffuse interstitial and septal prominence. Multiple bilateral cavitary lesions noted measuring up to 11 mm in the left lower lobe. There is no pleural effusion or pneumothorax. The central airways are patent.  Mild atherosclerotic calcification of the thoracic aorta. There is no aneurysm or dissection.  There is no CT evidence of pulmonary embolism. There bilateral hilar adenopathy. Subcarinal adenopathy measuring 1.6 cm in short axis. There is no cardiomegaly or pericardial effusion. There is coronary vascular calcification. The esophagus is grossly unremarkable. Small subcentimeter left thyroid hypodense nodule. There is no axillary adenopathy. Right pectoral Port-A-Cath with tip at the cavoatrial junction. The chest wall soft tissues appear unremarkable. Multiple 3-4 mm bilateral breast calcific foci noted. There are multiple sclerotic lesions throughout the spine compatible with metastatic disease. No acute fracture.  CT ABDOMEN and PELVIS FINDINGS  No intra-abdominal free air or fluid.  There are innumerable hepatic hypodense lesions  compatible with metastatic disease. The 1.6 x 1.8 cm lesion in the right lobe of the liver measured 1.7 x 1.7 cm on the prior study. Cholecystectomy. The pancreas, spleen, and the adrenal glands appear unremarkable. There is moderate right and mild left renal atrophy. Similar to prior study. Multiple renal hypodense lesions are not well characterized but appears stable compared to the prior study and likely represent cysts. Ultrasound may provide better evaluation. There is no hydronephrosis on either side. The visualized ureters and urinary bladder appear unremarkable. Hysterectomy.  There is scattered sigmoid diverticula without active inflammation. No evidence of bowel obstruction or inflammation. Stable appearance of the appendix without inflammatory changes.  There is aortoiliac atherosclerotic disease. The aorta and IVC appear patent. No portal venous gas identified. Grossly stable left para-aortic adenopathy measuring approximately 12 mm in short axis.  The grossly stable 9 x 6 cm cystic structure along the left pelvic wall compatible with previously described in the separate  The abdominal wall soft tissues appear unremarkable. There are sclerotic lesions of the bones compatible with metastatic disease. No acute fracture.  Review of the MIP images confirms the above findings.  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  No CT evidence of pulmonary embolism.  Diffuse hepatic metastatic disease grossly similar to prior study.  Left para-aortic lymphadenopathy similar to prior study.  Grossly stable osseous metastatic disease.  No significant change in the metastatic disease in the abdomen appears since the prior study.   Electronically Signed  By: Anner Crete M.D.  On: 11/23/2015 22:20     PATHOLOGY:  None  ASSESSMENT/PLAN: History of Stage III Carcinosarcoma of the Uterus Persistent elevation  of Ca-125 with normal imaging in May Presentation in December 2016 with abdominal pain Repeat imaging with progression but patient no showed for follow-up New imaging with further progression of disease Declining PS Poor tolerance to prior therapy   Stage III carcinosarcoma of the uterus, status post TAH, BSO, pelvic and para-aortic lymphadenectomy. Status post 2 cycles of carboplatin/Taxol resulting in severe peripheral neuropathy, radiotherapy completed on 10/31/2014.  Now with imaging demonstrating extensive pulmonary metastatic disease, diffuse hepatic metastatic disease, and gross osseous metastatic disease.  Therefore, she has Stage IV disease.  We reviewed the patient's imaging with the  patient and family.  We discussed stage and prognosis with the patient.  She is educated the her only treatment option is systemic chemotherapy in a palliative fashion.  Role of treatment would be palliative only.  Unfortunately, she has progressed significantly in a short time.  She now has abdominal pain, large pelvic/abdominal mass, progressive weakness, etc.  She is educated, along with her family, that future treatment options are difficult to tolerate given her performance status, clinical situation, and given her intolerance to first line therapy requiring a discontinuation of first line systemic chemotherapy.  Will follow the patient while she is an inpatient.  Will discuss with Dr. Willey Blade if the patient wishes to pursue therapy as she would be best served receiving her first dose of chemotherapy as an inpatient given her significant progression of disease over the past 1-2 weeks.  All questions were answered. The patient knows to call the clinic with any problems, questions or concerns.   Patient and plan discussed with Dr. Ancil Linsey and she is in agreement with the aforementioned.   This note is electronically signed by: Doy Mince 11/26/2015 6:50 PM   I spent a significant  proportion of time discussing with the patient and her daughter the poor prognosis of carcinosarcoma. We discussed commonly used drugs for recurrent disease and with her PS I do not feel that she would do well. We discussed goals of care ie. Being at home with family, comfort, vs. Hospital stays, office visits etc. The plan is for the family to discuss this issues.  I have asked the daughter to come up to the clinic in the am for further discussion of wishes/decisions or with additional questions.   Alina at prior visits has expressed to me not wanting chemotherapy, we have discussed her elevated tumor markers in the past. I will let them talk on these issues and will be available for further guidance. Donald Pore MD

## 2015-11-26 NOTE — Progress Notes (Signed)
Subjective: Kristina Rodriguez is awake and alert and in no distress. She ate better yesterday. She is drinking better.  Objective: Vital signs in last 24 hours: Filed Vitals:   11/25/15 1417 11/25/15 2136 11/25/15 2243 11/26/15 0647  BP: 154/65  165/71 183/84  Pulse: 113 112 109 114  Temp:   98.7 F (37.1 C) 98.7 F (37.1 C)  TempSrc:   Oral Oral  Resp: 20 20 20 20   Height:      Weight:      SpO2: 98% 93% 95% 94%   Weight change:   Intake/Output Summary (Last 24 hours) at 11/26/15 0725 Last data filed at 11/26/15 0650  Gross per 24 hour  Intake    720 ml  Output    200 ml  Net    520 ml    Physical Exam: Pharynx dry. Lungs clear. Heart tachycardic with no murmurs. Abdomen soft and nontender. Neuro stable.  Lab Results:    Results for orders placed or performed during the hospital encounter of 11/23/15 (from the past 24 hour(s))  Glucose, capillary     Status: Abnormal   Collection Time: 11/25/15  7:45 AM  Result Value Ref Range   Glucose-Capillary 300 (H) 65 - 99 mg/dL   Comment 1 Notify RN   Glucose, capillary     Status: Abnormal   Collection Time: 11/25/15 11:37 AM  Result Value Ref Range   Glucose-Capillary 443 (H) 65 - 99 mg/dL   Comment 1 Notify RN   Glucose, capillary     Status: Abnormal   Collection Time: 11/25/15  4:49 PM  Result Value Ref Range   Glucose-Capillary 346 (H) 65 - 99 mg/dL   Comment 1 Notify RN   Glucose, capillary     Status: Abnormal   Collection Time: 11/25/15  8:31 PM  Result Value Ref Range   Glucose-Capillary 299 (H) 65 - 99 mg/dL   Comment 1 Notify RN    Comment 2 Document in Chart      ABGS No results for input(s): PHART, PO2ART, TCO2, HCO3 in the last 72 hours.  Invalid input(s): PCO2 CULTURES Recent Results (from the past 240 hour(s))  Culture, blood (Routine X 2) w Reflex to ID Panel     Status: None (Preliminary result)   Collection Time: 11/23/15  7:55 PM  Result Value Ref Range Status   Specimen Description BLOOD RIGHT HAND   Final   Special Requests BOTTLES DRAWN AEROBIC ONLY 4CC ONLY  Final   Culture NO GROWTH 2 DAYS  Final   Report Status PENDING  Incomplete  Culture, blood (Routine X 2) w Reflex to ID Panel     Status: None (Preliminary result)   Collection Time: 11/23/15  8:01 PM  Result Value Ref Range Status   Specimen Description BLOOD LEFT HAND  Final   Special Requests BOTTLES DRAWN AEROBIC ONLY 4CC ONLY  Final   Culture NO GROWTH 2 DAYS  Final   Report Status PENDING  Incomplete  Urine culture     Status: None (Preliminary result)   Collection Time: 11/23/15  9:30 PM  Result Value Ref Range Status   Specimen Description URINE, CLEAN CATCH  Final   Special Requests NONE  Final   Culture   Final    NO GROWTH < 24 HOURS Performed at [facility]    Report Status PENDING  Incomplete   Studies/Results: No results found. Micro Results: Recent Results (from the past 240 hour(s))  Culture, blood (Routine X 2)  w Reflex to ID Panel     Status: None (Preliminary result)   Collection Time: 11/23/15  7:55 PM  Result Value Ref Range Status   Specimen Description BLOOD RIGHT HAND  Final   Special Requests BOTTLES DRAWN AEROBIC ONLY 4CC ONLY  Final   Culture NO GROWTH 2 DAYS  Final   Report Status PENDING  Incomplete  Culture, blood (Routine X 2) w Reflex to ID Panel     Status: None (Preliminary result)   Collection Time: 11/23/15  8:01 PM  Result Value Ref Range Status   Specimen Description BLOOD LEFT HAND  Final   Special Requests BOTTLES DRAWN AEROBIC ONLY 4CC ONLY  Final   Culture NO GROWTH 2 DAYS  Final   Report Status PENDING  Incomplete  Urine culture     Status: None (Preliminary result)   Collection Time: 11/23/15  9:30 PM  Result Value Ref Range Status   Specimen Description URINE, CLEAN CATCH  Final   Special Requests NONE  Final   Culture   Final    NO GROWTH < 24 HOURS Performed at [facility]    Report Status PENDING  Incomplete   Studies/Results: No results  found. Medications:  I have reviewed the patient's current medications Scheduled Meds: . enoxaparin (LOVENOX) injection  40 mg Subcutaneous Q24H  . gabapentin  600 mg Oral TID  . glipiZIDE  5 mg Oral QAC breakfast  . insulin aspart  0-9 Units Subcutaneous TID WC  . insulin glargine  15 Units Subcutaneous QHS   Continuous Infusions: . sodium chloride 50 mL/hr at 11/25/15 1048   PRN Meds:.ALPRAZolam, morphine injection, ondansetron **OR** ondansetron (ZOFRAN) IV   Assessment/Plan: #1. Metastatic carcinosarcoma of the uterus. Oncology consultation for follow-up today. #2. Dehydration. #3. Protein calorie malnutrition. #4. Diabetes. Restart glipizide. Continue Lantus and NovoLog. #5. Hypertension. Restart antihypertensive therapy. 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

## 2015-11-27 LAB — GLUCOSE, CAPILLARY
GLUCOSE-CAPILLARY: 172 mg/dL — AB (ref 65–99)
GLUCOSE-CAPILLARY: 184 mg/dL — AB (ref 65–99)
Glucose-Capillary: 227 mg/dL — ABNORMAL HIGH (ref 65–99)
Glucose-Capillary: 283 mg/dL — ABNORMAL HIGH (ref 65–99)

## 2015-11-27 NOTE — Discharge Summary (Signed)
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 Her initial chest x-ray was suggestive of possible right upper lobe pneumonia. She was treated initially in the emergency room with intravenous antibiotics. She was hospitalized. Further evaluation with a CT scan of the chest revealed diffuse areas of metastatic disease with the appearance more of tumor than pneumonia in the right upper lobe. 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 She was dehydrated on admission. She was treated successfully with intravenous fluids. General strength improved. She is had protein calorie malnutrition. Her albumin has dropped to 2.5. She has had abdominal discomfort and now has a lower abdominal mass with tenderness.  She was seen in consultation by oncology. Palliative chemotherapy was discussed. At this point the patient wishes to be discharged home. She would like to have assistance from hospice. These arrangements will be made.  She is stable for discharge on the morning of January 17. She'll be seen in follow-up in my office in one week.  Discharge exam reveals a comfortable but weak appearing female with clear lung fields and a stable tachycardia in the 110 range. Abdomen reveals a lower abdominal mass. Extremities reveal no edema.  Diabetes will be treated with glipizide and metformin. Metformin had been held following contrast  administration.  Achieving adequate comfort will be our primary goal at this point.   Discharge Instructions     Medication List    STOP taking these medications        HYDROcodone-homatropine 5-1.5 MG/5ML syrup  Commonly known as:  HYCODAN      TAKE these medications        diphenoxylate-atropine 2.5-0.025 MG tablet  Commonly known as:  LOMOTIL     glipiZIDE 5 MG tablet  Commonly known as:  GLUCOTROL  Take 5 mg by mouth daily.         Kristina Rodriguez 11/27/2015

## 2015-11-27 NOTE — Care Management (Signed)
Consult for hospice of [place] placed by Dr Beverlee Nims office. Spoke with Larena Glassman at [facility] who confirmed that representative would be coming by this afternoon to speak with family and patient.  Discussed plan with adult daughter Tessie Fass. Plan is for discharge tonight.

## 2015-11-28 ENCOUNTER — Ambulatory Visit (HOSPITAL_COMMUNITY): Payer: Commercial Managed Care - HMO | Admitting: Hematology & Oncology

## 2015-11-28 ENCOUNTER — Other Ambulatory Visit (HOSPITAL_COMMUNITY): Payer: Self-pay | Admitting: Oncology

## 2015-11-28 DIAGNOSIS — C549 Malignant neoplasm of corpus uteri, unspecified: Secondary | ICD-10-CM

## 2015-11-28 LAB — CULTURE, BLOOD (ROUTINE X 2)
Culture: NO GROWTH
Culture: NO GROWTH

## 2015-11-28 LAB — GLUCOSE, CAPILLARY
GLUCOSE-CAPILLARY: 190 mg/dL — AB (ref 65–99)
GLUCOSE-CAPILLARY: 195 mg/dL — AB (ref 65–99)

## 2015-11-28 MED ORDER — MORPHINE SULFATE (CONCENTRATE) 20 MG/ML PO SOLN: 10.0000 mg | ORAL | Status: AC | PRN

## 2015-11-28 MED ORDER — LORAZEPAM 2 MG/ML PO CONC: 1.0000 mg | ORAL | Status: AC | PRN

## 2015-11-28 NOTE — Care Management Important Message (Signed)
Important Message  Patient Details  Name: AHMARA DEGRAND MRN: [id number] Date of Birth: Aug 13, 1941   Medicare Important Message Given:  Yes    Alvie Heidelberg, RN 11/28/2015, 7:45 AM

## 2015-11-28 NOTE — Progress Notes (Signed)
Subjective: Kristina Rodriguez had a delay in discharge yesterday per the request of oncology. Arrangements have now been made for hospice care at her home. She is very drowsy this morning. She denies any pain currently.  Objective: Vital signs in last 24 hours: Filed Vitals:   11/27/15 2044 11/27/15 2053 11/28/15 0440 11/28/15 0558  BP:  148/75 166/69 117/90  Pulse:  121 116 59  Temp:  99 F (37.2 C) 100 F (37.8 C) 98.2 F (36.8 C)  TempSrc:  Oral Oral Oral  Resp:  20 20 20   Height:      Weight:      SpO2: 90% 96% 95% 89%   Weight change:   Intake/Output Summary (Last 24 hours) at 11/28/15 0740 Last data filed at 11/27/15 1800  Gross per 24 hour  Intake    360 ml  Output      0 ml  Net    360 ml    Physical Exam: Weak appearing and drowsy. Lungs clear. Heart tachycardic with no murmurs. Abdomen is soft with mild tenderness over her lower abdominal mass. Extremities reveal no edema.  Lab Results:    Results for orders placed or performed during the hospital encounter of 11/23/15 (from the past 24 hour(s))  Glucose, capillary     Status: Abnormal   Collection Time: 11/27/15 11:24 AM  Result Value Ref Range   Glucose-Capillary 283 (H) 65 - 99 mg/dL   Comment 1 Notify RN    Comment 2 Document in Chart   Glucose, capillary     Status: Abnormal   Collection Time: 11/27/15  4:18 PM  Result Value Ref Range   Glucose-Capillary 172 (H) 65 - 99 mg/dL   Comment 1 Notify RN    Comment 2 Document in Chart   Glucose, capillary     Status: Abnormal   Collection Time: 11/27/15  9:24 PM  Result Value Ref Range   Glucose-Capillary 184 (H) 65 - 99 mg/dL   Comment 1 Notify RN    Comment 2 Document in Chart      ABGS No results for input(s): PHART, PO2ART, TCO2, HCO3 in the last 72 hours.  Invalid input(s): PCO2 CULTURES Recent Results (from the past 240 hour(s))  Culture, blood (Routine X 2) w Reflex to ID Panel     Status: None (Preliminary result)   Collection Time: 11/23/15  7:55  PM  Result Value Ref Range Status   Specimen Description BLOOD RIGHT HAND  Final   Special Requests BOTTLES DRAWN AEROBIC ONLY 4CC ONLY  Final   Culture NO GROWTH 4 DAYS  Final   Report Status PENDING  Incomplete  Culture, blood (Routine X 2) w Reflex to ID Panel     Status: None (Preliminary result)   Collection Time: 11/23/15  8:01 PM  Result Value Ref Range Status   Specimen Description BLOOD LEFT HAND  Final   Special Requests BOTTLES DRAWN AEROBIC ONLY 4CC ONLY  Final   Culture NO GROWTH 4 DAYS  Final   Report Status PENDING  Incomplete  Urine culture     Status: None   Collection Time: 11/23/15  9:30 PM  Result Value Ref Range Status   Specimen Description URINE, CLEAN CATCH  Final   Special Requests NONE  Final   Culture   Final    NO GROWTH 2 DAYS Performed at [facility]    Report Status 11/26/2015 FINAL  Final   Studies/Results: No results found. Micro Results: Recent Results (from the  past 240 hour(s))  Culture, blood (Routine X 2) w Reflex to ID Panel     Status: None (Preliminary result)   Collection Time: 11/23/15  7:55 PM  Result Value Ref Range Status   Specimen Description BLOOD RIGHT HAND  Final   Special Requests BOTTLES DRAWN AEROBIC ONLY 4CC ONLY  Final   Culture NO GROWTH 4 DAYS  Final   Report Status PENDING  Incomplete  Culture, blood (Routine X 2) w Reflex to ID Panel     Status: None (Preliminary result)   Collection Time: 11/23/15  8:01 PM  Result Value Ref Range Status   Specimen Description BLOOD LEFT HAND  Final   Special Requests BOTTLES DRAWN AEROBIC ONLY 4CC ONLY  Final   Culture NO GROWTH 4 DAYS  Final   Report Status PENDING  Incomplete  Urine culture     Status: None   Collection Time: 11/23/15  9:30 PM  Result Value Ref Range Status   Specimen Description URINE, CLEAN CATCH  Final   Special Requests NONE  Final   Culture   Final    NO GROWTH 2 DAYS Performed at [facility]    Report Status 11/26/2015 FINAL  Final    Studies/Results: No results found. Medications:  I have reviewed the patient's current medications Scheduled Meds: . enoxaparin (LOVENOX) injection  40 mg Subcutaneous Q24H  . gabapentin  600 mg Oral TID  . glipiZIDE  5 mg Oral QAC breakfast  . insulin aspart  0-15 Units Subcutaneous TID WC  . insulin aspart  0-5 Units Subcutaneous QHS  . insulin glargine  15 Units Subcutaneous QHS  . losartan  100 mg Oral Daily  . potassium chloride  20 mEq Oral TID   Continuous Infusions: . sodium chloride 50 mL/hr at 11/27/15 2157   PRN Meds:.ALPRAZolam, HYDROcodone-acetaminophen, morphine injection, ondansetron **OR** ondansetron (ZOFRAN) IV   Assessment/Plan: #1. Metastatic carcinosarcoma of the uterus. Hospice consulted. Discharge today. Comfort care advised. #2. Diabetes. Last 2 glucoses were 184 and 172. #3. Dehydration. Resolved.  Follow-up arranged. 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

## 2015-11-28 NOTE — Progress Notes (Signed)
Left via EMS for discharge home in care of family and Hospice.  Drowsy at discharge, daughter present in room.

## 2015-11-28 NOTE — Clinical Social Work Note (Signed)
CSW arranged transport home via [facility]. Verified address and that family would be present upon arrival with daughter.  Benay Pike, [place]

## 2015-11-29 NOTE — Progress Notes (Signed)
This encounter was created in error - please disregard.

## 2015-12-12 DEATH — deceased

## 2015-12-26 ENCOUNTER — Encounter (HOSPITAL_COMMUNITY): Payer: Commercial Managed Care - HMO

## 2016-01-30 ENCOUNTER — Ambulatory Visit (HOSPITAL_COMMUNITY): Payer: Commercial Managed Care - HMO | Admitting: Hematology & Oncology

## 2016-01-30 ENCOUNTER — Other Ambulatory Visit (HOSPITAL_COMMUNITY): Payer: Commercial Managed Care - HMO

## 2016-07-09 IMAGING — CT CT ABD-PELV W/ CM
2 of 4 series · 15 of 46 positions shown, 17 images · IV contrast (Omnipaque 300)
Comparison: 05/09/2014

CLINICAL DATA: Followup endometrial carcinosarcoma status post
chemotherapy and radiation therapy. Diarrhea.

EXAM:
CT ABDOMEN AND PELVIS WITH CONTRAST
TECHNIQUE: Multidetector CT imaging of the abdomen and pelvis was performed
using the standard protocol following bolus administration of
intravenous contrast.
CONTRAST:  100mL OMNIPAQUE IOHEXOL 300 MG/ML  SOLN

[Series 2: abd_pel_with 5.0 b40f · axial · 0.75mm/px · z∈[-457,-22]mm · 12 of 97 slices shown, 14 images]
[im 5/97  soft-tissue]
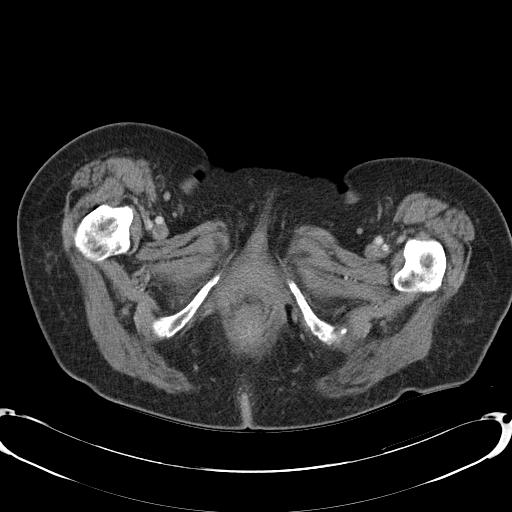
[im 5/97  bone]
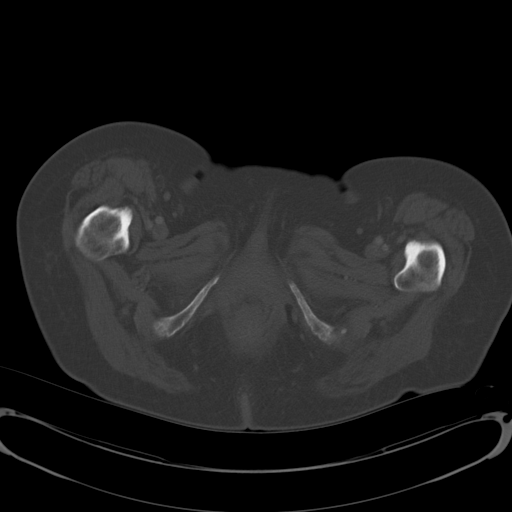
[im 14/97  soft-tissue]
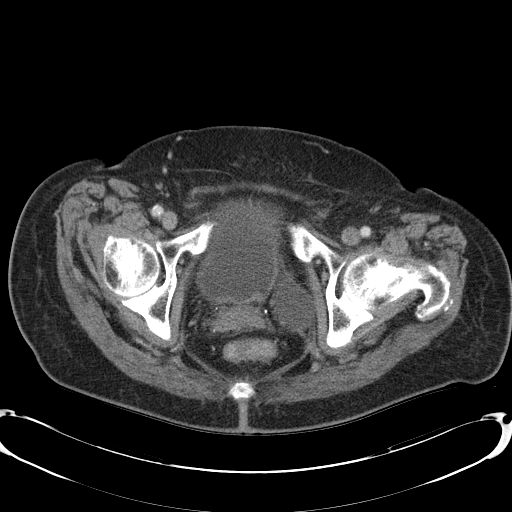
[im 23/97  soft-tissue]
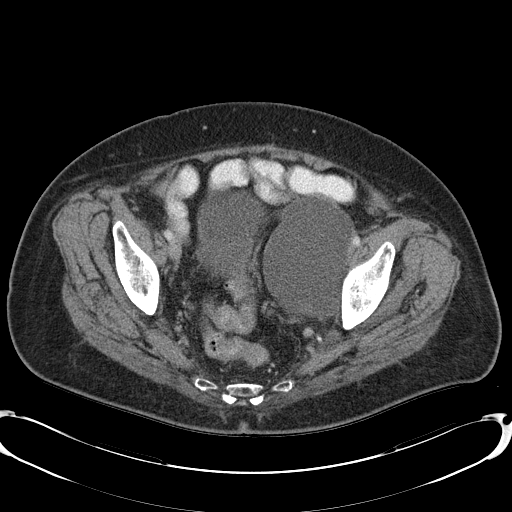
[im 28/97  soft-tissue]
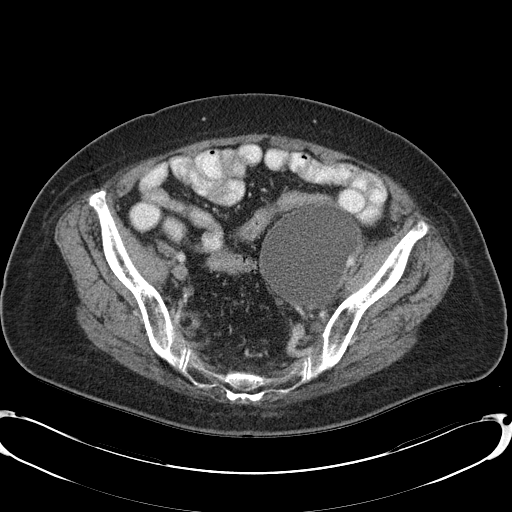
[im 37/97  soft-tissue]
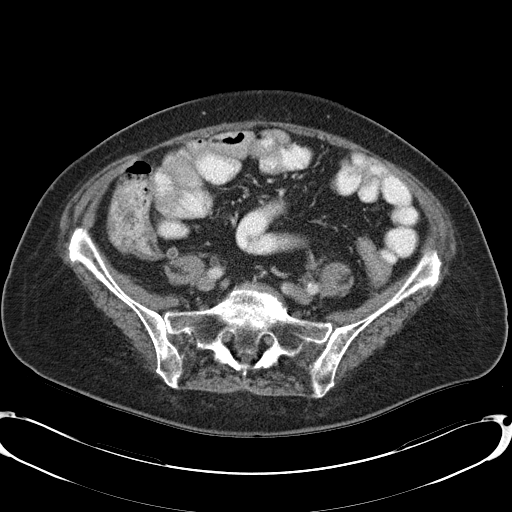
[im 46/97  soft-tissue]
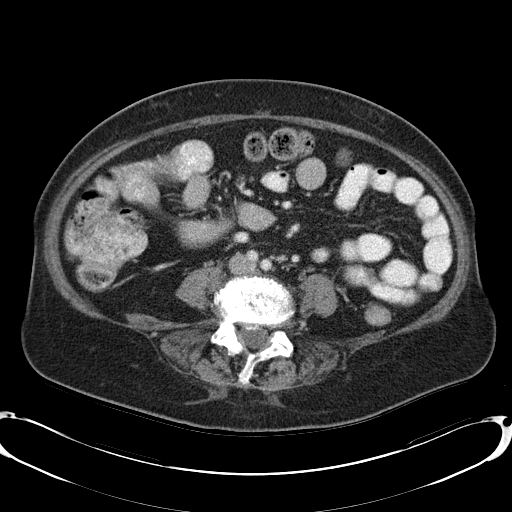
[im 51/97  soft-tissue]
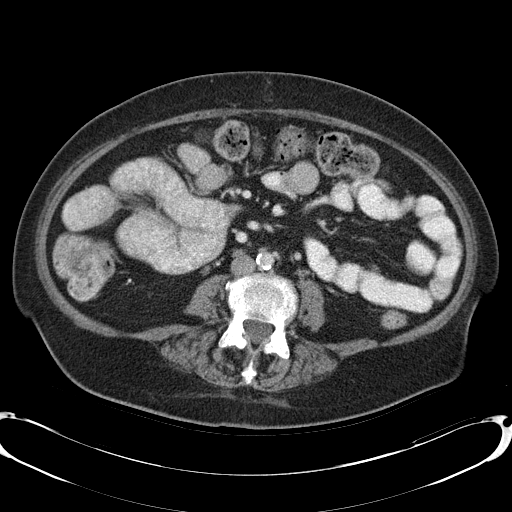
[im 60/97  soft-tissue]
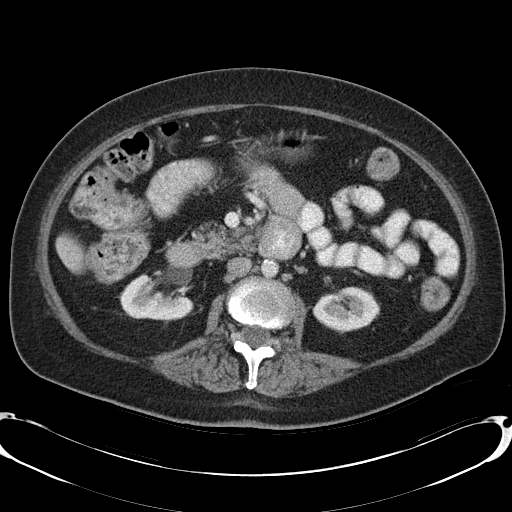
[im 69/97  soft-tissue]
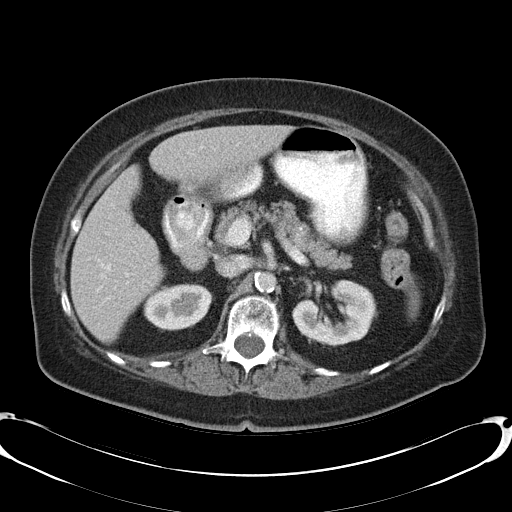
[im 69/97  bone]
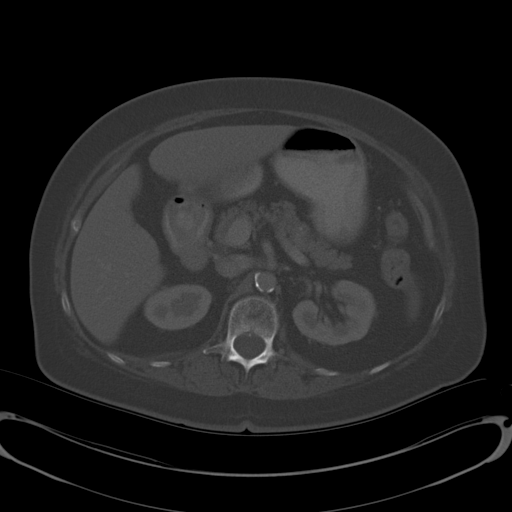
[im 74/97  soft-tissue]
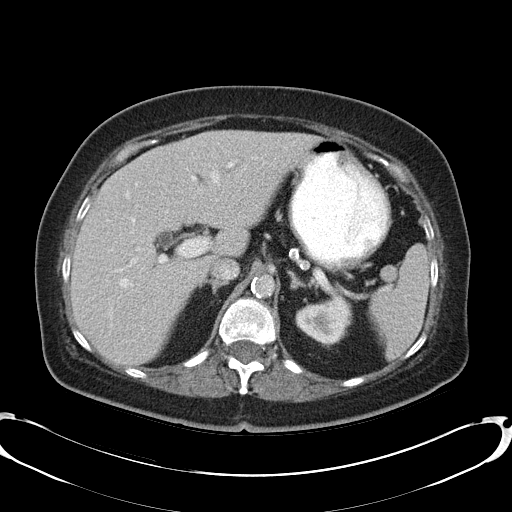
[im 83/97  soft-tissue]
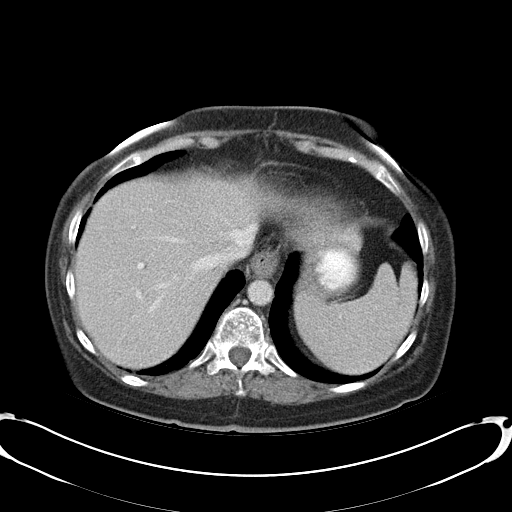
[im 92/97  soft-tissue]
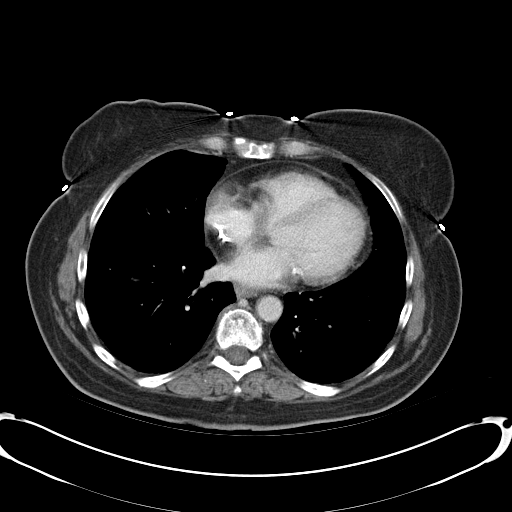

[Series 3: abd_pel_with 3.0 spo cor · coronal · 0.76mm/px · 3 of 80 slices shown]
[im 27/80  soft-tissue]
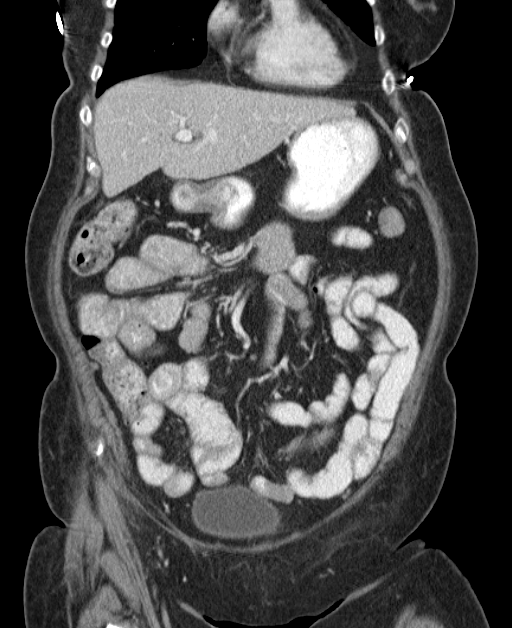
[im 36/80  soft-tissue]
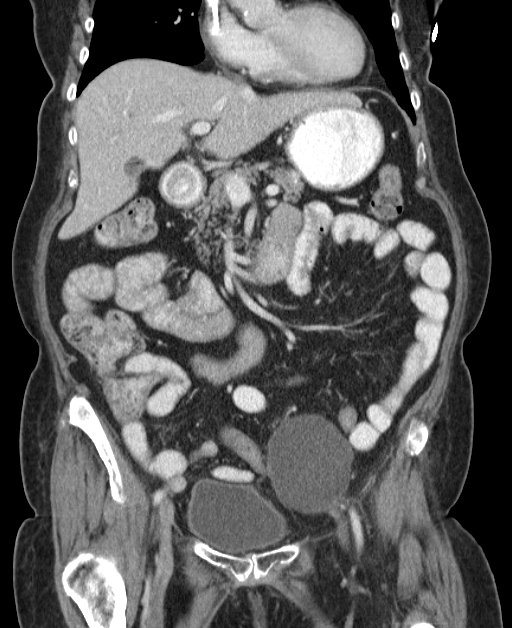
[im 44/80  soft-tissue]
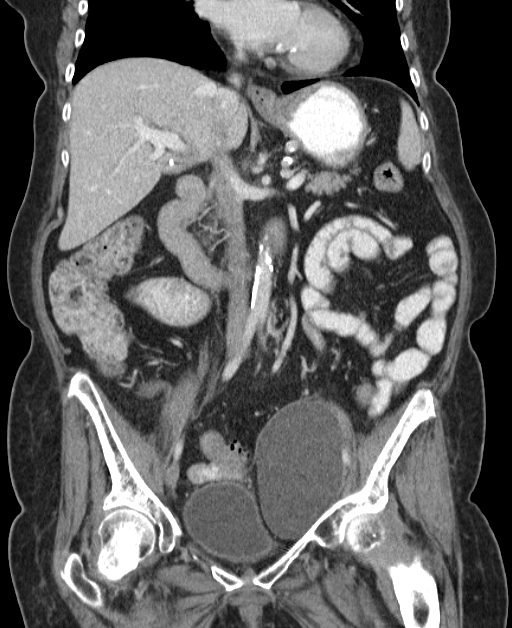

[15 of 46 positions shown; findings below may reference images not displayed]

FINDINGS: Lower Chest: Patchy airspace disease is seen in the inferior aspect
of the right middle lobe which is new since previous study, and
suspicious for infectious or inflammatory process. No discrete
nodules or masses are seen in visualized portions of lung bases.

Hepatobiliary: Prior cholecystectomy noted. No evidence of biliary
dilatation. Small cyst seen adjacent to the gallbladder fossa
remains stable. A sub-cm low-attenuation lesion in the lateral
segment left hepatic lobe on image 18/series 2 remains stable. This
is too small to characterize but most likely represents a tiny
benign hemangioma or cyst. No new or enlarging liver lesions
identified.

Pancreas: No mass, inflammatory changes, or other significant
abnormality identified.

Spleen:  Within normal limits in size and appearance.

Adrenals:  No masses identified.

Kidneys/Urinary Tract: No evidence of masses or hydronephrosis.
Stable small benign-appearing right renal cysts again noted.

Stomach/Bowel/Peritoneum: No evidence of wall thickening, mass, or
obstruction. Mild sigmoid diverticulosis noted, without evidence
diverticulitis.

Vascular/Lymphatic: No pathologically enlarged lymph nodes
identified. No other significant abnormality visualized.

Reproductive: Patient has undergone hysterectomy since previous
study. A fluid collection with thin enhancing rim is seen along the
left pelvic sidewall and iliac vessels which measures 6.5 x 9.2 cm.
This is consistent with a postop lymphocele. No other adnexal masses
are identified.

Other:  None.

Musculoskeletal:  No suspicious bone lesions identified.
IMPRESSION: Interval hysterectomy. 9 cm fluid collection along the left pelvic
sidewall and iliac vessels, consistent with postop lymphocele.

No evidence of residual or metastatic carcinoma within the pelvis or
abdomen.

New patchy airspace disease seen in right middle lobe, suspicious
for infectious or inflammatory process. Recommend clinical
correlation and chest radiograph for further evaluation.

## 2016-11-14 ENCOUNTER — Other Ambulatory Visit: Payer: Self-pay | Admitting: Nurse Practitioner
